# Patient Record
Sex: Female | Born: 1960 | Race: White | Hispanic: No | Smoking: Never smoker
Health system: Southern US, Community
[De-identification: ages and names within clinical notes are randomized; demographics above are authoritative.]

## PROBLEM LIST (undated history)

## (undated) DIAGNOSIS — K219 Gastro-esophageal reflux disease without esophagitis: Secondary | ICD-10-CM

## (undated) DIAGNOSIS — T4145XA Adverse effect of unspecified anesthetic, initial encounter: Secondary | ICD-10-CM

## (undated) DIAGNOSIS — R112 Nausea with vomiting, unspecified: Secondary | ICD-10-CM

## (undated) DIAGNOSIS — E785 Hyperlipidemia, unspecified: Secondary | ICD-10-CM

## (undated) DIAGNOSIS — Z8489 Family history of other specified conditions: Secondary | ICD-10-CM

## (undated) DIAGNOSIS — T7840XA Allergy, unspecified, initial encounter: Secondary | ICD-10-CM

## (undated) DIAGNOSIS — R740 Nonspecific elevation of levels of transaminase and lactic acid dehydrogenase [LDH]: Principal | ICD-10-CM

## (undated) DIAGNOSIS — Z8709 Personal history of other diseases of the respiratory system: Secondary | ICD-10-CM

## (undated) DIAGNOSIS — T8859XA Other complications of anesthesia, initial encounter: Secondary | ICD-10-CM

## (undated) DIAGNOSIS — Z9889 Other specified postprocedural states: Secondary | ICD-10-CM

## (undated) DIAGNOSIS — J302 Other seasonal allergic rhinitis: Secondary | ICD-10-CM

## (undated) HISTORY — PX: BREAST EXCISIONAL BIOPSY: SUR124

## (undated) HISTORY — PX: ABDOMINAL HYSTERECTOMY: SHX81

## (undated) HISTORY — PX: SPINE SURGERY: SHX786

## (undated) HISTORY — PX: BREAST BIOPSY: SHX20

## (undated) HISTORY — DX: Nonspecific elevation of levels of transaminase and lactic acid dehydrogenase (ldh): R74.0

## (undated) HISTORY — DX: Allergy, unspecified, initial encounter: T78.40XA

## (undated) HISTORY — DX: Hyperlipidemia, unspecified: E78.5

---

## 1998-07-31 ENCOUNTER — Encounter: Payer: Self-pay | Admitting: *Deleted

## 1998-07-31 ENCOUNTER — Ambulatory Visit (HOSPITAL_COMMUNITY): Admission: RE | Admit: 1998-07-31 | Discharge: 1998-07-31 | Payer: Self-pay | Admitting: *Deleted

## 1998-08-09 ENCOUNTER — Ambulatory Visit (HOSPITAL_COMMUNITY): Admission: RE | Admit: 1998-08-09 | Discharge: 1998-08-09 | Payer: Self-pay | Admitting: Otolaryngology

## 1999-02-16 ENCOUNTER — Other Ambulatory Visit: Admission: RE | Admit: 1999-02-16 | Discharge: 1999-02-16 | Payer: Self-pay | Admitting: Obstetrics and Gynecology

## 2000-03-03 ENCOUNTER — Other Ambulatory Visit: Admission: RE | Admit: 2000-03-03 | Discharge: 2000-03-03 | Payer: Self-pay | Admitting: Obstetrics and Gynecology

## 2000-03-13 ENCOUNTER — Ambulatory Visit (HOSPITAL_COMMUNITY): Admission: RE | Admit: 2000-03-13 | Discharge: 2000-03-13 | Payer: Self-pay | Admitting: Obstetrics and Gynecology

## 2000-03-13 ENCOUNTER — Encounter: Payer: Self-pay | Admitting: Obstetrics and Gynecology

## 2000-04-03 ENCOUNTER — Other Ambulatory Visit: Admission: RE | Admit: 2000-04-03 | Discharge: 2000-04-03 | Payer: Self-pay | Admitting: Obstetrics and Gynecology

## 2000-04-03 ENCOUNTER — Encounter (INDEPENDENT_AMBULATORY_CARE_PROVIDER_SITE_OTHER): Payer: Self-pay

## 2000-07-04 ENCOUNTER — Other Ambulatory Visit: Admission: RE | Admit: 2000-07-04 | Discharge: 2000-07-04 | Payer: Self-pay | Admitting: Obstetrics & Gynecology

## 2000-12-12 ENCOUNTER — Ambulatory Visit (HOSPITAL_COMMUNITY): Admission: RE | Admit: 2000-12-12 | Discharge: 2000-12-12 | Payer: Self-pay | Admitting: Obstetrics and Gynecology

## 2000-12-12 ENCOUNTER — Encounter (INDEPENDENT_AMBULATORY_CARE_PROVIDER_SITE_OTHER): Payer: Self-pay

## 2002-04-21 ENCOUNTER — Other Ambulatory Visit: Admission: RE | Admit: 2002-04-21 | Discharge: 2002-04-21 | Payer: Self-pay | Admitting: Obstetrics and Gynecology

## 2003-04-20 ENCOUNTER — Other Ambulatory Visit: Admission: RE | Admit: 2003-04-20 | Discharge: 2003-04-20 | Payer: Self-pay | Admitting: Obstetrics and Gynecology

## 2004-02-09 ENCOUNTER — Other Ambulatory Visit: Admission: RE | Admit: 2004-02-09 | Discharge: 2004-02-09 | Payer: Self-pay | Admitting: Obstetrics and Gynecology

## 2004-02-21 ENCOUNTER — Ambulatory Visit (HOSPITAL_COMMUNITY): Admission: RE | Admit: 2004-02-21 | Discharge: 2004-02-21 | Payer: Self-pay | Admitting: Obstetrics and Gynecology

## 2004-12-18 ENCOUNTER — Ambulatory Visit: Payer: Self-pay | Admitting: Internal Medicine

## 2005-02-12 ENCOUNTER — Other Ambulatory Visit: Admission: RE | Admit: 2005-02-12 | Discharge: 2005-02-12 | Payer: Self-pay | Admitting: Obstetrics and Gynecology

## 2005-02-26 ENCOUNTER — Ambulatory Visit (HOSPITAL_COMMUNITY): Admission: RE | Admit: 2005-02-26 | Discharge: 2005-02-26 | Payer: Self-pay | Admitting: Obstetrics and Gynecology

## 2005-04-15 ENCOUNTER — Ambulatory Visit: Payer: Self-pay | Admitting: Internal Medicine

## 2005-08-16 ENCOUNTER — Ambulatory Visit: Payer: Self-pay | Admitting: Internal Medicine

## 2005-08-29 ENCOUNTER — Ambulatory Visit: Payer: Self-pay | Admitting: Internal Medicine

## 2005-09-27 ENCOUNTER — Ambulatory Visit: Payer: Self-pay | Admitting: Internal Medicine

## 2005-10-21 HISTORY — PX: PARTIAL HYSTERECTOMY: SHX80

## 2005-12-04 ENCOUNTER — Ambulatory Visit: Payer: Self-pay | Admitting: Internal Medicine

## 2006-02-14 ENCOUNTER — Other Ambulatory Visit: Admission: RE | Admit: 2006-02-14 | Discharge: 2006-02-14 | Payer: Self-pay | Admitting: Obstetrics and Gynecology

## 2006-02-27 ENCOUNTER — Ambulatory Visit (HOSPITAL_COMMUNITY): Admission: RE | Admit: 2006-02-27 | Discharge: 2006-02-27 | Payer: Self-pay | Admitting: Obstetrics and Gynecology

## 2006-03-28 ENCOUNTER — Ambulatory Visit: Payer: Self-pay | Admitting: Internal Medicine

## 2006-06-20 ENCOUNTER — Ambulatory Visit (HOSPITAL_COMMUNITY): Admission: RE | Admit: 2006-06-20 | Discharge: 2006-06-20 | Payer: Self-pay | Admitting: Family Medicine

## 2006-06-20 ENCOUNTER — Emergency Department (HOSPITAL_COMMUNITY): Admission: EM | Admit: 2006-06-20 | Discharge: 2006-06-20 | Payer: Self-pay | Admitting: Emergency Medicine

## 2006-06-30 ENCOUNTER — Inpatient Hospital Stay (HOSPITAL_COMMUNITY): Admission: AD | Admit: 2006-06-30 | Discharge: 2006-07-03 | Payer: Self-pay | Admitting: Obstetrics and Gynecology

## 2006-06-30 ENCOUNTER — Encounter (INDEPENDENT_AMBULATORY_CARE_PROVIDER_SITE_OTHER): Payer: Self-pay | Admitting: Specialist

## 2006-07-01 ENCOUNTER — Encounter (INDEPENDENT_AMBULATORY_CARE_PROVIDER_SITE_OTHER): Payer: Self-pay | Admitting: Specialist

## 2007-03-25 ENCOUNTER — Ambulatory Visit (HOSPITAL_COMMUNITY): Admission: RE | Admit: 2007-03-25 | Discharge: 2007-03-25 | Payer: Self-pay | Admitting: Obstetrics and Gynecology

## 2007-04-27 ENCOUNTER — Ambulatory Visit: Payer: Self-pay | Admitting: Internal Medicine

## 2007-10-06 ENCOUNTER — Emergency Department (HOSPITAL_COMMUNITY): Admission: EM | Admit: 2007-10-06 | Discharge: 2007-10-06 | Payer: Self-pay | Admitting: Emergency Medicine

## 2007-10-22 HISTORY — PX: NECK SURGERY: SHX720

## 2007-11-09 ENCOUNTER — Telehealth: Payer: Self-pay | Admitting: Internal Medicine

## 2007-11-12 ENCOUNTER — Ambulatory Visit (HOSPITAL_COMMUNITY): Admission: RE | Admit: 2007-11-12 | Discharge: 2007-11-12 | Payer: Self-pay | Admitting: Orthopedic Surgery

## 2007-11-13 ENCOUNTER — Encounter: Payer: Self-pay | Admitting: Internal Medicine

## 2007-11-13 DIAGNOSIS — J309 Allergic rhinitis, unspecified: Secondary | ICD-10-CM | POA: Insufficient documentation

## 2007-11-21 ENCOUNTER — Encounter: Payer: Self-pay | Admitting: Internal Medicine

## 2008-01-20 ENCOUNTER — Encounter: Payer: Self-pay | Admitting: Internal Medicine

## 2008-03-07 ENCOUNTER — Ambulatory Visit (HOSPITAL_COMMUNITY): Admission: RE | Admit: 2008-03-07 | Discharge: 2008-03-08 | Payer: Self-pay | Admitting: Neurosurgery

## 2008-04-26 ENCOUNTER — Ambulatory Visit: Payer: Self-pay | Admitting: Internal Medicine

## 2008-08-10 ENCOUNTER — Encounter: Payer: Self-pay | Admitting: Internal Medicine

## 2008-08-23 ENCOUNTER — Encounter: Payer: Self-pay | Admitting: Internal Medicine

## 2008-08-27 ENCOUNTER — Emergency Department (HOSPITAL_COMMUNITY): Admission: EM | Admit: 2008-08-27 | Discharge: 2008-08-27 | Payer: Self-pay | Admitting: Emergency Medicine

## 2008-10-24 ENCOUNTER — Emergency Department (HOSPITAL_COMMUNITY): Admission: EM | Admit: 2008-10-24 | Discharge: 2008-10-24 | Payer: Self-pay | Admitting: Family Medicine

## 2009-03-15 ENCOUNTER — Ambulatory Visit (HOSPITAL_COMMUNITY): Admission: RE | Admit: 2009-03-15 | Discharge: 2009-03-15 | Payer: Self-pay | Admitting: Obstetrics and Gynecology

## 2009-03-25 ENCOUNTER — Emergency Department (HOSPITAL_COMMUNITY): Admission: EM | Admit: 2009-03-25 | Discharge: 2009-03-25 | Payer: Self-pay | Admitting: Emergency Medicine

## 2009-04-20 ENCOUNTER — Ambulatory Visit: Payer: Self-pay | Admitting: Internal Medicine

## 2009-07-27 ENCOUNTER — Emergency Department (HOSPITAL_COMMUNITY): Admission: EM | Admit: 2009-07-27 | Discharge: 2009-07-27 | Payer: Self-pay | Admitting: Emergency Medicine

## 2009-09-20 ENCOUNTER — Telehealth: Payer: Self-pay | Admitting: Internal Medicine

## 2009-11-15 ENCOUNTER — Emergency Department (HOSPITAL_COMMUNITY): Admission: EM | Admit: 2009-11-15 | Discharge: 2009-11-15 | Payer: Self-pay | Admitting: Family Medicine

## 2009-12-21 ENCOUNTER — Telehealth: Payer: Self-pay | Admitting: Internal Medicine

## 2010-03-16 ENCOUNTER — Ambulatory Visit (HOSPITAL_COMMUNITY): Admission: RE | Admit: 2010-03-16 | Discharge: 2010-03-16 | Payer: Self-pay | Admitting: Obstetrics and Gynecology

## 2010-04-13 ENCOUNTER — Emergency Department (HOSPITAL_COMMUNITY): Admission: EM | Admit: 2010-04-13 | Discharge: 2010-04-13 | Payer: Self-pay | Admitting: Family Medicine

## 2010-05-01 ENCOUNTER — Ambulatory Visit: Payer: Self-pay | Admitting: Internal Medicine

## 2010-05-14 ENCOUNTER — Encounter: Payer: Self-pay | Admitting: Internal Medicine

## 2010-06-04 ENCOUNTER — Ambulatory Visit (HOSPITAL_COMMUNITY): Admission: RE | Admit: 2010-06-04 | Discharge: 2010-06-04 | Payer: Self-pay | Admitting: Otolaryngology

## 2010-09-28 ENCOUNTER — Emergency Department (HOSPITAL_COMMUNITY)
Admission: EM | Admit: 2010-09-28 | Discharge: 2010-09-28 | Payer: Self-pay | Source: Home / Self Care | Admitting: Family Medicine

## 2010-11-11 ENCOUNTER — Encounter: Payer: Self-pay | Admitting: Family Medicine

## 2010-11-22 NOTE — Assessment & Plan Note (Signed)
Summary: 12 MONTHS/APC   Visit Type:  Follow-up PCP:  None  Chief Complaint:  yearly follow up visit-recently had neck surgery.  History of Present Illness: Current Problems:  ALLERGIC RHINITIS (ICD-74.68)   50 year old nonsmoker of returning for follow-up of allergic rhinitis.  Quit allergy vaccine in 2007.  Quit Singulair "hates pills.", using it only with flares.  Worse in the spring.  Wakes with headache, advanced refill Esgic-Plus.  Five Patanase nasal spray, and thought it made her tongue numb.  Denies material at discharge, fever, cough, or wheeze.       Updated Prior Medication List: RESCON-MX 8-40-2.5 MG  TB12 (CHLORPHEN-PHENYLEPH-METHSCOP) Take 1 tablet by mouth once a day as needed NASACORT AQ 55 MCG/ACT  AERS (TRIAMCINOLONE ACETONIDE(NASAL))  SINGULAIR 10 MG  TABS (MONTELUKAST SODIUM)   Current Allergies (reviewed today): ! DAYPRO  Past Surgical History:    Reviewed history and no changes required:       Partial hysterectomy       ? neck surgery?   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 NECK:  Supple w/ fair ROM; no JVD; normal carotid impulses w/o bruits; no thyromegaly or nodules palpated; no lymphadenopathy. CHEST: Clear to P&A HEART:  RRR, no m/r/g  heard ABDOMEN:  Soft & nt; nml bowel sounds; no organomegaly or masses detected.  EXT: Warm bilat,  no calf pain, edema, clubbing, pulses intact Skin: no rash/lesion        Impression & Recommendations:  Problem # 1:  ALLERGIC RHINITIS (ICD-477.9)  examination suggests incomplete control of rhinitis, but she is not inclined therapy.  Now.  We have discussed options.  I did suggest a Neti pot and we will give sample of astepro nasal spray.  Refill Esgic-Plus for headache. Her updated medication list for this problem includes:    Nasacort Aq 55 Mcg/act Aers (Triamcinolone acetonide(nasal))   Medications Added to Medication List This Visit: 1)  Butalbital-apap-caffeine 50-500-40 Mg Tabs (Butalbital-apap-caffeine) .Marland Kitchen.. 1 four times a day as needed   Patient Instructions: 1)  Please schedule a follow-up appointment in 1 year. 2)  Sample Astepro 1-2 sprays each nostril twice daily as needed 3)  Try the Neti pot approach to rinse your nasal passages 4)  Esgic script printed   Prescriptions: BUTALBITAL-APAP-CAFFEINE 50-500-40 MG  TABS (BUTALBITAL-APAP-CAFFEINE) 1 four times a day as needed  #50 x 3   Entered and Authorized by:   Waymon Budge MD   Signed by:   Waymon Budge MD on 04/26/2008   Method used:   Print then  Give to Patient   RxID:   [id number]  ]

## 2010-11-22 NOTE — Miscellaneous (Signed)
Summary: Add DALLERGY ER tabs  Clinical Lists Changes  Medications: Added new medication of DALLERGY PSE 4-60-1.25 MG XR12H-TAB (CHLORPHEN-PSEUDOEPH-METHSCOP) 1 by mouth daily as needed - Signed Rx of DALLERGY PSE 4-60-1.25 MG XR12H-TAB (CHLORPHEN-PSEUDOEPH-METHSCOP) 1 by mouth daily as needed;  #90 x 3;  Signed;  Entered by: Michel Bickers CMA;  Authorized by: Waymon Budge MD;  Method used: Telephoned to [facility]*, , ,   , Ph: [phone], Fax: [phone] RX form signed by Dr. Maple Hudson and faxed back to [facility]. Michel Bickers CMA  August 10, 2008 2:14 PM     Prescriptions: DALLERGY PSE 4-60-1.25 MG XR12H-TAB (CHLORPHEN-PSEUDOEPH-METHSCOP) 1 by mouth daily as needed  #90 x 3   Entered by:   Michel Bickers CMA   Authorized by:   Waymon Budge MD   Signed by:   Michel Bickers CMA on 08/10/2008   Method used:   Telephoned to ...       [facility]* (mail-order)             ,          Ph: [phone]       Fax: [phone]   RxID:   [id number]

## 2010-11-22 NOTE — Consult Note (Signed)
Summary: [facility]  [facility]   Imported By: Sherian Rein 05/24/2010 08:49:36  _____________________________________________________________________  External Attachment:    Type:   Image     Comment:   External Document

## 2010-11-22 NOTE — Miscellaneous (Signed)
Summary: remove RX  Clinical Lists Changes  Medications: Removed medication of RESCON-MX 8-40-2.5 MG  TB12 (CHLORPHEN-PHENYLEPH-METHSCOP) Take 1 tablet by mouth once a day as needed

## 2010-11-22 NOTE — Progress Notes (Signed)
Summary: refill-waiting on pharmacist to call back  Phone Note From Pharmacy Call back at [phone]   Caller:  outpatient pharmacy Call For: young  Reason for Call: Patient requests substitution Summary of Call: Pt has been on dallergy tablets and they are currently on back order with no release date from manufacturer. Patient is requesting an alternative. Please advise. Initial call taken by: Darletta Moll,  December 21, 2009 11:09 AM  Follow-up for Phone Call        Please advise, thanks Vernie Murders  December 21, 2009 11:13 AM  Per Dr Maple Hudson- there are 3 drugs that make up the Dallergy- chlorpheneramine 4 mg, psuedophedrine 60 mg and methscopalamine 1.25 mg. We need to call the pharmacist and see if pt can get all of these seperately and take all three.   Vernie Murders  December 21, 2009 1:22 PM  called and spoke with pharmacist and she states she is nt sure if al 3 meds are available and will check it out and call me back. Sh eis also going tolook at alternative meds that are similar to dallergy tab, she says there are a few out onmarket now. Carron Curie CMA  December 21, 2009 3:25 PM  pharmacist sent a fax regarding the available alternative to dallergy tabs. I have given to cy to review. Carron Curie CMA  December 21, 2009 4:42 PM   Additional Follow-up for Phone Call Additional follow up Details #1::        Pt stated that she only has one tab left, and she needs a rx asap, please advise.//[phone] Darletta Moll,  December 26, 2009 9:09 AM  will forward message back to CY to address alternatives to Dallergy.  Aundra Millet Reynolds LPN  December 26, 9145 9:42 AM     Additional Follow-up for Phone Call Additional follow up Details #2::    Spoke with CDY-suggest pt try Dallergy PE take 1 by mouth every 12 hours-will need to send Rx to [facility] out pt pharmacy. If this works well for pt then we can send 90 day supply if needed or give more refills if she gets monthly. ONLY send in 1 month  supply for now. Reynaldo Minium CMA  December 26, 2009 3:24 PM   pt advised and new rx sent to pharmacy. Carron Curie CMA  December 26, 2009 3:49 PM   New/Updated Medications: DALLERGY PE 8-20-2.5 MG XR12H-TAB (CHLORPHEN-PHENYLEPH-METHSCOP) two times a day Prescriptions: DALLERGY PE 8-20-2.5 MG XR12H-TAB (CHLORPHEN-PHENYLEPH-METHSCOP) two times a day  #60 x 0   Entered by:   Carron Curie CMA   Authorized by:   Waymon Budge MD   Signed by:   Carron Curie CMA on 12/26/2009   Method used:   Electronically to        [facility]* (retail)       [address]       [address] Shipping/mailing       [place], [place]  [address]       Ph: [phone]       Fax: [phone]   RxID:   [id number]

## 2010-11-22 NOTE — Assessment & Plan Note (Signed)
Summary: per pt call/cb   Primary Provider/Referring Provider:  None  CC:  Yearly followup.  Pt denies any complaints today.Marland Kitchen  History of Present Illness: 04/26/08-ALLERGIC RHINITIS (ICD-43.40)  50 year old nonsmoker of returning for follow-up of allergic rhinitis.  Quit allergy vaccine in 2007.  Quit Singulair "hates pills.", using it only with flares.  Worse in the spring.  Wakes with headache, advanced refill Esgic-Plus.  Five Patanase nasal spray, and thought it made her tongue numb.  Denies material at discharge, fever, cough, or wheeze.  04/20/09- Allergic rhinits This is best time- mid summer. Less stuffy nose and watery rhinorhea now. Stays on Nasacort and decongestant once daily. Adds Singulair in Spring season. She is satisfied with her current level of control, after years of more difficulty.    Current Medications (verified): 1)  Nasacort Aq 55 Mcg/act  Aers (Triamcinolone Acetonide(Nasal)) .... 2 Sprays To Each Nostril Daily 2)  Singulair 10 Mg  Tabs (Montelukast Sodium) .Marland Kitchen.. 1 Once Daily As Needed 3)  Butalbital-Apap-Caffeine 50-500-40 Mg  Tabs (Butalbital-Apap-Caffeine) .Marland Kitchen.. 1 Four Times A Day As Needed 4)  Dallergy Pse 4-60-1.25 Mg Xr12h-Tab (Chlorphen-Pseudoeph-Methscop) .Marland Kitchen.. 1 By Mouth Daily As Needed  Allergies (verified): 1)  ! Daypro  Past History:  Past Surgical History: Last updated: 04/26/2008 Partial hysterectomy ? neck surgery?  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 (04/26/2008)  Past Medical History: ALLERGIC RHINITIS (ICD-477.9) Migraine  Family History: Parents living- hypertension, dementia Father- AFIB, DM2,   Review of Systems      See HPI  Vital Signs:  Patient profile:   50 year old female Height:      64 inches Weight:      120.50 pounds BMI:     20.76 O2 Sat:       99 % on Room air Pulse rate:   82 / minute BP sitting:   166 / 78  (left arm)  Vitals Entered By: Vernie Murders (April 20, 2009 9:22 AM)  O2 Flow:  Room air  Physical Exam  Additional Exam:  General: A/Ox3; pleasant and cooperative, NAD, wdwn SKIN: no rash, lesions NODES: no lymphadenopathy HEENT: [place]/AT, EOM- WNL, Conjuctivae- clear, PERRLA, TM-WNL, Nose- clear, Throat- clear and wnl NECK: Supple w/ fair ROM, JVD- none, normal carotid impulses w/o bruits Thyroid-  CHEST: Clear to P&A HEART: RRR, no m/g/r heard ABDOMEN: Soft and nl;  AVW:UJWJ, nl pulses, no edema  NEURO: Grossly intact to observation      Impression & Recommendations:  Problem # 1:  ALLERGIC RHINITIS (ICD-477.9)  Doing very well  Her updated medication list for this problem includes:    Nasacort Aq 55 Mcg/act Aers (Triamcinolone acetonide(nasal)) .Marland Kitchen... 2 sprays to each nostril daily with no changes indicated after discussion.  Medications Added to Medication List This Visit: 1)  Nasacort Aq 55 Mcg/act Aers (Triamcinolone acetonide(nasal)) .... 2 sprays to each nostril daily 2)  Singulair 10 Mg Tabs (Montelukast sodium) .Marland Kitchen.. 1 once daily as needed  Other Orders: Est. Patient Level II (19147)  Patient Instructions: 1)  Please schedule a follow-up appointment in 1 year. 2)  Scripts for refils Prescriptions: BUTALBITAL-APAP-CAFFEINE 50-500-40 MG  TABS (BUTALBITAL-APAP-CAFFEINE) 1 four times a day as needed  #50 x 3   Entered and Authorized by:   Waymon Budge MD   Signed by:   Waymon Budge MD on 04/20/2009  Method used:   Print then Give to Patient   RxID:   [id number] NASACORT AQ 55 MCG/ACT  AERS (TRIAMCINOLONE ACETONIDE(NASAL)) 2 sprays to each nostril daily  #3 x 3   Entered and Authorized by:   Waymon Budge MD   Signed by:   Waymon Budge MD on 04/20/2009   Method used:   Print then Give to Patient   RxID:   [id number] DALLERGY PSE 4-60-1.25 MG XR12H-TAB  (CHLORPHEN-PSEUDOEPH-METHSCOP) 1 by mouth daily as needed  #90 x 3   Entered and Authorized by:   Waymon Budge MD   Signed by:   Waymon Budge MD on 04/20/2009   Method used:   Print then Give to Patient   RxID:   [id number]

## 2010-11-22 NOTE — Progress Notes (Signed)
  Phone Note From Pharmacy   Summary of Call: D-Allergy PSE no longer available. They can substitute D-Allergy PE (phenylephrine)    New/Updated Medications: * DALLERGY PE 4-60-1.25 MG XR12H-TAB (CHLORPHEN-PHEN-METHSCOP) 1 by mouth daily as needed Prescriptions: DALLERGY PE 4-60-1.25 MG XR12H-TAB (CHLORPHEN-PHEN-METHSCOP) 1 by mouth daily as needed  #90 x 3   Entered by:   Vernie Murders   Authorized by:   Waymon Budge MD   Signed by:   Vernie Murders on 09/20/2009   Method used:   Historical   RxID:   [id number] DALLERGY PE 4-60-1.25 MG XR12H-TAB (CHLORPHEN-PHEN-METHSCOP) 1 by mouth daily as needed  #90 x 3   Entered and Authorized by:   Waymon Budge MD   Signed by:   Waymon Budge MD on 09/20/2009   Method used:   Historical   RxID:   [id number]  x was faxed to [facility] outpt pharm Vernie Murders  September 20, 2009 5:17 PM

## 2010-11-22 NOTE — Assessment & Plan Note (Signed)
Summary: rov/jd   Primary Provider/Referring Provider:  None  CC:  Yearly follow up and pt has no complaints other than pt would like to see what ehr other options would be for her allergy medication.  History of Present Illness:  History of Present Illness: 04/26/08-ALLERGIC RHINITIS (ICD-30.9)  50 year old nonsmoker of returning for follow-up of allergic rhinitis.  Quit allergy vaccine in 2007.  Quit Singulair "hates pills.", using it only with flares.  Worse in the spring.  Wakes with headache, advanced refill Esgic-Plus.  Five Patanase nasal spray, and thought it made her tongue numb.  Denies material at discharge, fever, cough, or wheeze.  04/20/09- Allergic rhinits This is best time- mid summer. Less stuffy nose and watery rhinorhea now. Stays on Nasacort and decongestant once daily. Adds Singulair in Spring season. She is satisfied with her current level of control, after years of more difficulty.  May 01, 2010- Allergic rhinitis She has to order the D-allergy, and it isn't as effective as sudafed.. Singulair didn't help. Sudafed-PE doesn't work quite as well as Statistician. Using Nasacort. She had cautery years ago, but is willing now to consider a mechanical fix for a mechanical problem. Occasional stress headache. Denies purulent discharge.    Preventive Screening-Counseling & Management  Alcohol-Tobacco     Smoking Status: never  Current Medications (verified): 1)  Nasacort Aq 55 Mcg/act  Aers (Triamcinolone Acetonide(Nasal)) .... 2 Sprays To Each Nostril Daily 2)  Butalbital-Apap-Caffeine 50-500-40 Mg  Tabs (Butalbital-Apap-Caffeine) .Marland Kitchen.. 1 Four Times A Day As Needed 3)  Dallergy Pe 8-20-2.5 Mg Xr12h-Tab (Chlorphen-Phenyleph-Methscop) .... Two Times A Day 4)  Oscal 500/200 D-3 500-200 Mg-Unit Tabs (Calcium-Vitamin D) .Marland Kitchen.. 1 Tab Twice Daily  Allergies: 1)  ! Daypro  Past History:  Past Medical History: Last updated: 04/20/2009 ALLERGIC RHINITIS  (ICD-477.9) Migraine  Past Surgical History: Last updated: 04/26/2008 Partial hysterectomy ? neck surgery?  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  The patient denies shortness of breath with activity, shortness of breath at rest, productive cough, non-productive cough, coughing up blood, chest pain, irregular heartbeats, acid heartburn, indigestion, loss of appetite, weight change, abdominal pain, difficulty swallowing, sore throat, and tooth/dental problems.    Vital Signs:  Patient profile:   50 year old female Height:      64 inches Weight:      124.6 pounds BMI:     21.46 O2 Sat:      100 % on Room air Pulse rate:   109 / minute BP sitting:   110 / 76  (right arm) Cuff size:   regular  Vitals Entered By: Mindy [name] (May 01, 2010 8:53 AM)  O2 Flow:  Room air CC: Yearly follow up, pt has no complaints other than pt would like to see what ehr other options would be for her allergy medication Comments Meds and allergies updated Phone number updated Mindy [name]  May 01, 2010 8:55 AM    Physical Exam  Additional Exam:  General: A/Ox3; pleasant and cooperative, NAD, wdwn SKIN: no rash, lesions NODES: no lymphadenopathy HEENT: [name], EOM- WNL, Conjuctivae- clear, PERRLA, TM-WNL, Nose- narrow with frequent sniffing, Throat- clear and wnl,  Mallampati  II NECK: Supple w/  fair ROM, JVD- none, normal carotid impulses w/o bruits Thyroid-  CHEST: Clear to P&A HEART: RRR, no m/g/r heard ABDOMEN: Soft and nl;  ZOX:WRUE, nl pulses, no edema  NEURO: Grossly intact to observation      Impression & Recommendations:  Problem # 1:  ALLERGIC RHINITIS  (ICD-477.9)  She has a narrow nose. Rather than just depending forever on decongestants, she is willing to go back to Dr Annalee Genta to consider if more rom could be made in her nose mechanically. She continues D-allerg y for now. Her updated medication list for this problem includes:    Nasacort Aq 55 Mcg/act Aers (Triamcinolone acetonide(nasal)) .Marland Kitchen... 2 sprays to each nostril daily  Medications Added to Medication List This Visit: 1)  Oscal 500/200 D-3 500-200 Mg-unit Tabs (Calcium-vitamin d) .Marland Kitchen.. 1 tab twice daily  Other Orders: Est. Patient Level III (45409) ENT Referral (ENT)  Patient Instructions: 1)  Please schedule a follow-up appointment in 1 year. 2)  See [facility] for referral to dr Annalee Genta 3)  Samples Lodarane-D 24 one or two daily if needed. 4)  Script was sent for D-allergy Prescriptions: DALLERGY PE 8-20-2.5 MG XR12H-TAB (CHLORPHEN-PHENYLEPH-METHSCOP) two times a day  #180 x 3   Entered and Authorized by:   Mindy Budge MD   Signed by:   Mindy Budge MD on 05/01/2010   Method used:   Electronically to        [facility]* (retail)       [address]       [address] Shipping/mailing       [place], [place]  [address]       Ph: [phone]       Fax: [phone]   RxID:   [id number]

## 2010-12-31 LAB — POCT RAPID STREP A (OFFICE): Streptococcus, Group A Screen (Direct): NEGATIVE

## 2011-01-24 LAB — POCT URINALYSIS DIP (DEVICE)
Bilirubin Urine: NEGATIVE
Glucose, UA: NEGATIVE mg/dL
Hgb urine dipstick: NEGATIVE
Ketones, ur: NEGATIVE mg/dL
Nitrite: NEGATIVE
Protein, ur: NEGATIVE mg/dL
Specific Gravity, Urine: 1.02 (ref 1.005–1.030)
Urobilinogen, UA: 0.2 mg/dL (ref 0.0–1.0)
pH: 6 (ref 5.0–8.0)

## 2011-01-24 LAB — URINALYSIS, MICROSCOPIC ONLY
Bilirubin Urine: NEGATIVE
Glucose, UA: NEGATIVE mg/dL
Hgb urine dipstick: NEGATIVE
Ketones, ur: NEGATIVE mg/dL
Leukocytes, UA: NEGATIVE
Nitrite: NEGATIVE
Protein, ur: NEGATIVE mg/dL
Specific Gravity, Urine: 1.024 (ref 1.005–1.030)
Urobilinogen, UA: 0.2 mg/dL (ref 0.0–1.0)
pH: 6 (ref 5.0–8.0)

## 2011-01-24 LAB — URINE CULTURE: Colony Count: 2000

## 2011-02-16 ENCOUNTER — Inpatient Hospital Stay (INDEPENDENT_AMBULATORY_CARE_PROVIDER_SITE_OTHER)
Admission: RE | Admit: 2011-02-16 | Discharge: 2011-02-16 | Disposition: A | Discharge status: Home / Self Care | Payer: 59 | Source: Ambulatory Visit | Attending: Family Medicine | Admitting: Family Medicine

## 2011-02-16 DIAGNOSIS — N390 Urinary tract infection, site not specified: Secondary | ICD-10-CM

## 2011-02-16 LAB — POCT URINALYSIS DIP (DEVICE)
Bilirubin Urine: NEGATIVE
Glucose, UA: NEGATIVE mg/dL
Nitrite: NEGATIVE
Protein, ur: NEGATIVE mg/dL
Specific Gravity, Urine: 1.025 (ref 1.005–1.030)
Urobilinogen, UA: 0.2 mg/dL (ref 0.0–1.0)
pH: 6.5 (ref 5.0–8.0)

## 2011-02-19 ENCOUNTER — Telehealth: Payer: Self-pay | Admitting: Internal Medicine

## 2011-02-19 NOTE — Telephone Encounter (Signed)
Spoke w/ pt and she states her insurance will no longer cover Dallergy PE. Pt is needing an alternative. Pt states when she asked her insurance company what will they cover she was advised she needed to call her doctors office for an alternative. Please advise Dr. Maple Hudson. Thanks  Allergies  Allergen Reactions  . Oxaprozin     Carver Fila, CMA

## 2011-02-19 NOTE — Telephone Encounter (Signed)
Per CDY-suggest Mucinex D and Allegra 60mg  bid.Mindy [name]

## 2011-02-19 NOTE — Telephone Encounter (Signed)
Spoke w/ pt and advised her of cdy recs. Pt verbalized understanding and needed nothing else further

## 2011-03-05 NOTE — Assessment & Plan Note (Signed)
[facility]                             PULMONARY OFFICE NOTE   NAME:[name], Mindy A                       MRN:          [id number]  DATE:04/27/2007                            DOB:          01-06-1961    PROBLEM:  Allergic rhinitis, headache.   HISTORY:  She stopped allergy vaccine a year ago.  She says by  comparison this year, she has been sneezing more and may have had a  little increase in headache, but she is willing to keep on as she is  doing for now.  In the meantime she had GYN surgery.  She had previously  tried Astelin but disliked the taste.   MEDICATION:  1. Rescon - MX.  2. Nasacort AQ.  3. Singulair is used occasionally p.r.n.  4. Saline nasal spray.   DRUG INTOLERANT:  DAYPRO causing hives.   OBJECTIVE:  Weight 131 pounds.  BP 112/62, pulse 65, room air saturation  100%.  Nasal mucosa is rather reddened, not eroded.  There is periorbital edema  which is chronic without conjunctival injection.  No postnasal drip.  LUNGS:  Quiet and clear.  HEART:  Sounds regular.   IMPRESSION:  Allergic rhinitis.   PLAN:  Try Patanase 1 or 2 sprays each nostril b.i.d. p.r.n., while  continuing her Nasacort AQ.  Consider NasalCrom as an alternative.  Return p.r.n. or 1 year.     Clinton D. Maple Hudson, MD, Tonny Bollman, FACP  Electronically Signed    CDY/MedQ  DD: 04/27/2007  DT: 04/28/2007  Job #: [id number]

## 2011-03-05 NOTE — Op Note (Signed)
Mindy [name], GRAYS NO.:  [id number]   MEDICAL RECORD NO.:  [id number]          PATIENT TYPE:  OIB   LOCATION:  3523                         FACILITY:  [facility]   PHYSICIAN:  Hewitt Shorts, M.D.DATE OF BIRTH:  15-Jan-1961   DATE OF PROCEDURE:  03/07/2008  DATE OF DISCHARGE:                               OPERATIVE REPORT   PREOPERATIVE DIAGNOSES:  1. C6-C7 cervical disk herniation.  2. Cervical degenerative disk disease.  3. Cervical spondylosis.  4. Cervical radiculopathy.   POSTOPERATIVE DIAGNOSES:  1. C6-C7 cervical disk herniation.  2. Cervical degenerative disk disease.  3. Cervical spondylosis.  4. Cervical radiculopathy.   PROCEDURE:  C6-C7 anterior cervical decompression and arthrodesis with  allograft and tethered cervical plating.   SURGEON:  Hewitt Shorts, MD   ASSISTANTS:  1. Russell L. Webb Silversmith, RN  2. Hilda Lias, MD   ANESTHESIA:  General endotracheal.   INDICATION:  The patient is a 50 year old woman who presented with  cervical radiculopathy with weakness of the right triceps.  MRI revealed  single-level disk herniation, superimposed upon underlying degenerative  disk disease spondylosis, and a decision was made to proceed with single-  level anterior cervical diskectomy and arthrodesis.   PROCEDURE:  The patient was brought to the operating room and placed  under general endotracheal anesthesia.  The patient was placed in 10  pounds of Holter traction.  The neck was prepped with Betadine soap and  solution and draped in sterile fashion.  The line of the incision was  infiltrated with local anesthetic with epinephrine.  The incision was  made along the left side of the neck and carried down to the  subcutaneous tissue and platysma.  Bipolar cautery was used to maintain  hemostasis.  Dissection was carried down through an avascular plane  leaving the sternocleidomastoid, carotid artery, and jugular vein  laterally and the  trachea and esophagus medially.  The ventral aspect of  the vertebral column was identified and localizing x-rays were taken,  and the C6-C7 intervertebral disk space identified.  Diskectomy was  begun with incision of the annulus and continued with microcurettes and  pituitary rongeurs.  Anterior osteophytic overgrowth was removed using  an osteophyte removal tool and the X-Max drill.  The microscope was  draped and brought into the field to provide additional navigation,  illumination, and visualization.  The remainder of the decompression was  performed using microdissection and microsurgical technique.  The  cauterized endplates of the vertebral body surfaces were removed using  microcurettes and the X-Max drill, and posterior osteophytic overgrowth  was removed using the X-Max drill and a 2-mm Kerrison punch with a  central footplate.  We then carefully removed the posterior longitudinal  ligament and found the bilobed disk herniation worse on the left than  the right that was carefully removed and we were able to decompress the  exiting C7 nerve roots bilaterally.  The spinal canal and thecal sac  were similarly decompressed, and once decompression was completed, the  hemostasis was established with Gelfoam.  We measured the height of the  intervertebral  disk space, and a 6-mm implant was selected, it was  hydrated with saline solution, and positioned in the intervertebral disk  space and countersunk.  We then selected a 12-mm tethered cervical  plate, it was positioned over the fusion construct.  It was secured to  the vertebra with 4 x 14-mm variable-angled screws.  Each screw was  drilled.  The screws were placed in alternating fashion.  Once all 4  screws were in place, final tightening was performed.  The cervical  traction was discontinued, and x-ray was taken, which showed the graft,  plate, and screws all in good position, the alignment was good, and then  the wound was  irrigated with Bacitracin solution and checked for  hemostasis, which was established and confirmed, and then we proceeded  with closure.  The platysma was closed with interrupted inverted 2-0  undyed Vicryl sutures.  The subcutaneous and subcuticular layer closed  with interrupted inverted 3-0 undyed Vicryl sutures.  The skin was  reapproximated with Dermabond.  The procedure was tolerated well.  The  estimated blood loss was 25 mL.  Sponge and needle count correct.  Following the surgery, the patient was placed in a soft cervical collar,  reversed from the anesthetic, extubated, to be transferred to the  recovery room for further care.      Hewitt Shorts, M.D.  Electronically Signed     RWN/MEDQ  D:  03/07/2008  T:  03/08/2008  Job:  [id number]

## 2011-03-08 NOTE — Op Note (Signed)
NAMEMINNETTE, MERIDA NO.:  [id number]   MEDICAL RECORD NO.:  [id number]          PATIENT TYPE:  INP   LOCATION:  9310                          FACILITY:  [facility]   PHYSICIAN:  Miguel Aschoff, M.D.       DATE OF BIRTH:  1961/02/23   DATE OF PROCEDURE:  06/30/2006  DATE OF DISCHARGE:                                 OPERATIVE REPORT   PREOPERATIVE DIAGNOSIS:  Pelvic pain, pelvic mass.   POSTOPERATIVE DIAGNOSIS:  Pelvic pain, mass involving the right fallopian  tube, pelvic inflammation, possible tubal torsion versus tubal neoplasm.   SURGEON:  Dr. Miguel Aschoff.   ASSISTANT:  Gerrit Friends. Aldona Bar, M.D.   PROCEDURE:  Laparoscopy followed by laparotomy with right salpingectomy and  total abdominal hysterectomy.   SURGEON:  Dr. Miguel Aschoff.   ASSISTANT:  Gerrit Friends. Aldona Bar, M.D.   ANESTHESIA:  General.   BRIEF HISTORY:  The patient is a 50 year old white female who presented with  sudden onset of acute lower abdominal pain and right lower quadrant pain and  was evaluated at [facility] system at which time CT scan was  carried out which revealed a 6 cm posterior pelvic mass, thought to be a  uterine fibroid but other adnexal mass could not be excluded.  The patient's  pain continued.  In view of the degree of pain and this mass, it was felt  that this possibly represented necrobiosis of a fibroid or an acute process  involving pelvic mass.  The options were discussed with the patient.  She is  brought to the operating room to undergo laparoscopy and laparotomy if  indicated.  It was also discussed if a laparotomy needed to be done, that  hysterectomy also be done in conjunction with the laparotomy. Informed  consent has been obtained.   PROCEDURE:  The patient was taken to the operating placed in the supine  position.  General anesthesia was administered without difficulty.  She was  then placed in the dorsal lithotomy position, prepped and draped in usual  sterile  fashion.  Examination was carried out which revealed normal external  genitalia, normal Bartholin Skene's glands, normal urethra.  The cervix did  not reveal any lesions.  Evaluation of the uterus revealed a very large mass  in the cul-de-sac.  It was not certain whether this represented the  posterior uterine fundus with fibroids or some other adnexal pathology.  At  this point a Foley catheter inserted.  Hulka tenaculum was placed.  Attention was then directed to the umbilicus where a small infraumbilical  incision was made.  Veress needle was inserted and then the abdomen was  insufflated with 3 liters CO2.  Following insufflation laparoscope was  introduced and to allow complete visualization, a suprapubic 5 mm port was  established and under direct visualization and noted be moderate amount of  serosanguineous fluid in the cul-de-sac.  There was a large amount of  adhesions in the right lower quadrant extending to the anterior abdominal  wall.  These were filmy in nature. The uterus was visualized, appeared to  be  anterior, but it was not possible to elevate the uterus to evaluate the  mass.  Appeared to be densely held by adhesions.  Because of this it was  elected to discontinue laparoscopy and proceed with laparotomy.  At this  point Pfannenstiel incision was made, extended through subcutaneous tissue  with bleeding points being clamped and coagulated as they were encountered.  Fascia was then identified, incised transversely and separated from the  underlying rectus muscles.  Rectus muscles were divided in midline.  Peritoneum was then found and entered carefully avoiding underlying  structures.  At this point the peritoneal incision was extended under direct  visualization.  The anterior adhesions were then taken down sharply without  difficulty.  The self-retaining retractor was then placed through the wound  and viscera packed out of pelvis.  At this point it was possible to  free  this mass involving the cul-de-sac.  The mass proved to be a 6-cm mass  involving the right fallopian tube.  The right ovary appeared be within  normal limits.  It was not clear whether this represented a torsion of the  tube or whether this represented neoplasm within the tube.  The mass was  excised and sent for pathology.  As laparotomy was in process it was elected  proceed with the hysterectomy as previously discussed with the patient.  The  round ligaments were identified, clamped, cut, suture ligated.  Broad  ligament was then skeletonized and anterior bladder flap was created.  The  utero-ovarian ligaments were then clamped, cut and suture ligated using  suture ligatures of 0 Vicryl and then free ties of 0 Vicryl.  Broad  ligaments then further skeletonized.  Uterine vessels were found, clamped  and suture ligated using suture ligatures of 0 Vicryl.  Then serial bites  were taken of paracervical fascia using straight Heaney clamps, all pedicles  were cut and suture ligated using suture ligatures of 0 Vicryl was then  possible continued dissection until the cardinal ligaments, uterosacral  ligaments were identified.  These were clamped and suture ligated using  curved Heaney clamps and suture ligatures of 0 Vicryl.  At this point the  vagina was cross clamped and the specimen excised consisting of the cervix  and uterus.  The angles of the vaginal cuff were then ligated using figure  eight sutures of 0 Vicryl.  These were held and then the cuff was closed  using running locking 0 Vicryl suture.  The pelvic floor was then  reperitonealized using running continuous 0 Vicryl suture.  Inspection was  then made for hemostasis.  Hemostasis appeared be excellent. Again the left  tube and ovary were totally within normal limits.  The right ovary was  within normal limits.  There was a lot of fibrinous deposition in the cul-de- sac secondary to the mass that had been previously removed.   At this point  with good hemostasis, lap counts and instrument counts taken and found to be  correct and then the abdomen was closed.  The parietal peritoneum was closed  using running continuous 0 Vicryl suture.  Rectus muscles were  reapproximated using running continuous 0 Vicryl suture.  Fascia was closed  using two sutures of 0 Vicryl each starting at lateral fascial angles and  meeting in midline.  Subcutaneous tissue was closed using interrupted 0  Vicryl sutures and skin incision was closed using staples.  The small  infraumbilical incision was closed using subcuticular 4-0 Vicryl.  The  specimen of the right tube, cervix and the uterus were sent for pathologic  study.  The patient brought to recovery room in satisfactory condition.  The  estimated blood loss was approximately 100 mL.      Miguel Aschoff, M.D.  Electronically Signed     AR/MEDQ  D:  06/30/2006  T:  07/01/2006  Job:  [id number]

## 2011-03-08 NOTE — Discharge Summary (Signed)
[name], Mindy [name] NO.:  [id number]   MEDICAL RECORD NO.:  [id number]          PATIENT TYPE:  INP   LOCATION:  9310                          FACILITY:  [facility]   PHYSICIAN:  Miguel Aschoff, M.D.       DATE OF BIRTH:  1960/11/04   DATE OF ADMISSION:  06/30/2006  DATE OF DISCHARGE:  07/03/2006                                 DISCHARGE SUMMARY   ADMISSION DIAGNOSIS:  Pelvic mass, pelvic pain.   FINAL DIAGNOSIS:  Necrosis of parasitic leiomyoma.   OPERATIONS AND PROCEDURES:  Laparoscopy followed by right salpingectomy and  total abdominal hysterectomy.  General anesthesia.   BRIEF HISTORY:  The patient is a 50 year old white female who presented with  severe abdominal pain.  She was seen in the emergency room and noted to have  a solid 6-cm mass in the pelvis.  This was initially treated conservatively.  However, the pain progressed, and because of the symptomatology associated  with it, the patient was brought to the hospital to undergo laparoscopy and  laparotomy if indicated.  Preoperative studies were obtained which revealed  an admission hemoglobin of 9.9, hematocrit 29.5, white count of 6100.  PT/PTT were within normal limits, as were her chemistries.  Pregnancy test  was negative.   HOSPITAL COURSE:  Under general anesthesia, the patient was brought to the  operating room and a laparoscopy was carried out, initially revealing an  inflammatory mass involving the pelvis.  This mass was adherent to the  posterior surface of the uterus, as well as to the bowel.  Because it could  not be mobilized laparoscopically, it was elected to proceed with a  laparotomy.  This inflammatory mass was then freed and appeared to be a  large solid mass arising off the right fallopian tube.  The mass was excised  along with the right tube.  The right ovary was within normal limits, as was  the left ovary, and following excision of this mass hysterectomy was carried  out without  difficulty.  The patient's postoperative course was essentially  uncomplicated.  She tolerated increasing ambulation and diet well.  She did  have some mild nausea, and by July 03, 2006, she was in satisfactory  condition and able to be discharged home.   MEDICATIONS FOR HOME:  1. Tylox one every 3 hours as needed for pain.  2. Reglan 10 mg every 6 hours as needed for nausea.  3. Toradol 10 mg p.o. q.4 h p.r.n. pain.   DISCHARGE INSTRUCTIONS:  She was instructed to place nothing in the vagina,  to call if there are problems such as fever, pain or heavy bleeding.   FOLLOWUP:  The patient will be seen back in 4 weeks for follow-up  examination.   PATHOLOGY REPORT:  The final pathology report of the specimen revealed a  necrotic leiomyoma.  Cervix and uterus were unremarkable.      Miguel Aschoff, M.D.  Electronically Signed     AR/MEDQ  D:  07/23/2006  T:  07/24/2006  Job:  [id number]

## 2011-03-08 NOTE — H&P (Signed)
[facility]  Patient:    Mindy [name], Mindy [name]                         MRN: [id number] Attending:  Janine Limbo, M.D.                         History and Physical  HISTORY OF PRESENT ILLNESS:   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  She had had cryotherapy twice in the past because of abnormal Pap smears.  The patient underwent colposcopically-directed biopsy on October 31, 2000, and she as noted to have a stenotic cervix at that point.  The transition zone could not be visualized. The patient was found to have white epithelium on her exocervix.  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  The patient is currently using Triphasil for contraception.  PAST MEDICAL HISTORY:         The patient has seasonal allergies, and she currently takes Claritin D and Flonase as needed.  DRUG ALLERGIES:               DAYPRO.  OBSTETRICAL HISTORY:          None.  SOCIAL HISTORY:               The patient drinks alcohol socially.  She denies cigarette use and other recreational drug uses.  REVIEW OF SYSTEMS:            Noncontributory.  FAMILY HISTORY:               The patients father has adult-onset diabetes. Her father, mother, and two brothers have hypertension.  PHYSICAL EXAMINATION:  VITAL SIGNS:                  Weight is 119 pounds.  HEENT:                        Within normal limits.  CHEST:                        Clear.  HEART:                        Regular rate and rhythm.  BREASTS:                      Without masses.  ABDOMEN:                      Nontender.  EXTREMITIES:                  Within normal limits.  NEUROLOGIC:                   Normal.  PELVIC:                       External genitalia is normal.  The vagina is normal.  Cervix is nontender.  Uterus is normal size, shape, and consistency. Adnexa no masses.  ASSESSMENT:                   Abnormal Pap smear  with an inadequate colposcopy.  History of CIN in the past.  PLAN:  The patient will undergo a loop electrosurgical excision procedure.  She understands the indications for her procedure, and she accepts the risks of, but not limited to, anesthetic complications, bleeding, infections, and possible damage to the surrounding organs.   DD: 12/11/00 TD:  12/11/00 Job: [id number]

## 2011-03-08 NOTE — Discharge Summary (Signed)
Mindy [name], CILIA NO.:  [id number]   MEDICAL RECORD NO.:  [id number]          PATIENT TYPE:  INP   LOCATION:  9310                          FACILITY:  [facility]   PHYSICIAN:  Miguel Aschoff, M.D.       DATE OF BIRTH:  Aug 29, 1961   DATE OF ADMISSION:  06/30/2006  DATE OF DISCHARGE:  07/03/2006                                 DISCHARGE SUMMARY   ADMISSION DATE:  June 30, 2006.   DISCHARGED:  July 03, 2006.   ADMISSION DIAGNOSES:  Pelvic mass, pelvic pain.   FINAL DIAGNOSES:  Necrotic leiomyoma.   OPERATIONS AND PROCEDURES:  Laparoscopy followed by exploratory laparotomy  with total abdominal hysterectomy and right salpingectomy.   BRIEF HISTORY:  The patient is a 50 year old, white female who developed the  onset of severe lower abdominal pain on August 29, for which she was sent to  the emergency room in the [facility] system.  At that time, CT scan  was performed and revealed the presence of an 8-cm mass in the cul-de-sac  that appeared to be consistent with a myoma.  No other abnormalities were  noted.  In the pelvis, there was no free fluid.  The patient was initially  treated conservatively; however, pain persisted and worsened and she was  evaluated on June 26, 2006, at [facility] and she has marked  pelvic tenderness and pain and, again, ultrasound findings consistent with a  fibroid.  It was felt that the fibroid was undergoing necrosis and this was  accounting for her symptomatology.  Due to the severe symptoms, however, it  was necessary to admit the patient to the hospital to undergo laparoscopy to  see if this mass could be identified and removed.  She was therefore  admitted to the [facility] for a diagnostic laparoscopic and possible  laparotomy.   HOSPITAL COURSE:  Preoperative studies were obtained and, on June 30, 2006, she was brought to the operating room where under general anesthesia,  laparoscopy  was carried out.  The findings of laparoscopy revealed adhesions  of the omentum to the anterior abdominal wall in the right lower quadrant as  well as a large inflammatory mass in the cul-de-sac that could not be  assessed adequately through the laparoscope.  Due to this, laparotomy was  carried out.  Findings noted at the laparotomy revealed a hard necrotic mass  filling the cul-de-sac adherent between the right ovary, posterior broad  ligament, and posterior surface of the uterus as well as the posterior  surface of the cul-de-sac.  The mass was elevated and appeared to be  originating from the fallopian tube.  The mass was excised and, following  the excision of the mass, hysterectomy was carried out without difficulty.  The patient's postoperative course was essentially uncomplicated.  She did  tolerate increasing ambulation and diet well and, by September 13, was in  satisfactory condition to be discharged home.   MEDICATIONS FOR HOME:  Included  1. Tylox 1 every 3 hours as needed for pain.  2. Toradol 10 mg  p.o. q.4h. p.r.n.  3. Reglan 10 mg p.r.n. nausea.   The pathology report on the mass revealed be to be 121 g necrotic leiomyoma.  Hysterectomy specimen revealed an endometrium, unremarkable cervix, and,  otherwise, unremarkable myometrium.   The patient was sent home.  She will be seen back in 4 weeks for follow-up  examination.  She is to call if there are problems such as fever, pain, or  heavy bleeding.  She is instructed to place nothing in the vagina and to  avoid any heavy lifting for 4 weeks.   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      Miguel Aschoff, M.D.  Electronically Signed     AR/MEDQ  D:  07/09/2006  T:  07/10/2006  Job:  [id number]

## 2011-03-08 NOTE — Op Note (Signed)
[facility]  Patient:    Mindy [name], Mindy [name]                       MRN: [id number] Proc. Date: 12/13/00 Adm. Date:  [id number] Disc. Date: [id number] Attending:  Leonard Schwartz                           Operative Report  PREOPERATIVE DIAGNOSIS:       1. 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                               2. Inadequate colposcopy (transition zone not                                  seen).   POSTOPERATIVE DIAGNOSIS:      1. 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                               2. Inadequate colposcopy (transition zone not                                  seen).  OPERATION:                    LEEP (loop electrosurgical excision).  SURGEON:                      Janine Limbo, M.D.  ANESTHESIA:                   Local Marcaine.  ESTIMATED BLOOD LOSS:         Less than 5 cc.  INDICATIONS:                  The patient is a 50 year old who has had cryotherapy twice in the past because of cervical intraepithelial neoplasia. She again has cervical intraepithelial neoplasia and on this occasion her transition zone cannot be visualized. 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 The patient understands the indications for this procedure and she accepts the risks of, but not limited to anesthetic complications, bleeding, infections, and possible damage to the surrounding organs.  INTRAOPERATIVE FINDINGS:      The cervix was easily visualized and there were no unstained areas using Lugol solution.  DESCRIPTION OF PROCEDURE:     The patient was taken to the operating room where she was placed in a lithotomy position. The vagina and cervix were prepped with multiple layers of acetic acid. Lugols solution was placed on the cervix. The cervix was injected with a dilute solution of  Pitressin, saline and 0.50% Marcaine. The LEEP apparatus was used to obtain a cone-like specimen. Hemostasis was noted to be  adequate. The patient was returned to the supine position and then taken to the recovery room in stable condition. The estimated blood loss was less than 5 cc. DD:  12/13/00 TD:  12/15/00 Job: [id number]

## 2011-04-30 ENCOUNTER — Ambulatory Visit: Payer: Self-pay | Admitting: Internal Medicine

## 2011-05-31 ENCOUNTER — Encounter: Payer: Self-pay | Admitting: Internal Medicine

## 2011-06-03 ENCOUNTER — Ambulatory Visit (INDEPENDENT_AMBULATORY_CARE_PROVIDER_SITE_OTHER): Payer: 59 | Admitting: Internal Medicine

## 2011-06-03 ENCOUNTER — Encounter: Payer: Self-pay | Admitting: Internal Medicine

## 2011-06-03 VITALS — BP 110/70 | HR 73 | Ht 64.0 in | Wt 127.4 lb

## 2011-06-03 DIAGNOSIS — J309 Allergic rhinitis, unspecified: Secondary | ICD-10-CM

## 2011-06-03 MED ORDER — SCOPOLAMINE 1 MG/3DAYS TD PT72: 1.0000 | MEDICATED_PATCH | TRANSDERMAL | Status: DC

## 2011-06-03 MED ORDER — IPRATROPIUM BROMIDE 0.06 % NA SOLN: 2.0000 | Freq: Four times a day (QID) | NASAL | Status: DC

## 2011-06-03 NOTE — Patient Instructions (Signed)
Scripts to try ipratropium anticholinergic nasal spray as needed, and scopolomine transdermal patches as needed. Either can be combined with your other meds.  Consider revisiting Dr Annalee Genta

## 2011-06-03 NOTE — Progress Notes (Signed)
Subjective:    Patient ID: Mindy [name], female    DOB: January 06, 1961, 50 y.o.   MRN: [id number]  HPI 06/03/11- 64 yoF never smoker,  followed for allergic rhinitis complicated by hx sinusitis Last here May 01, 2010- Nose not working as well as she would like. DAllergy available by prescription, but costs too much. She is now using either Mucinex D or Allegra D and having more headaches, pressure behind eyes and frontal, sniffing. Dislikes Neti pot but hasn't tried squeeze bottle. Expects to be worse in fall allergy season. When she was on allergy shots, she was using the same amounts of meds- not better.  Previous cauterization and evaluation by Dr Annalee Genta helped some.   Review of Systems Constitutional:   No-   weight loss, night sweats, fevers, chills, fatigue, lassitude. HEENT:   No-  headaches, difficulty swallowing, tooth/dental problems, sore throat,       + sneezing, itching, ear ache, nasal congestion, post nasal drip,  CV:  No-   chest pain, orthopnea, PND, swelling in lower extremities, anasarca, dizziness, palpitations Resp: No-   shortness of breath with exertion or at rest.              No-   productive cough,  No non-productive cough,  No-  coughing up of blood.              No-   change in color of mucus.  No- wheezing.   Skin: No-   rash or lesions. GI:  No-   heartburn, indigestion, abdominal pain, nausea, vomiting, diarrhea,                 change in bowel habits, loss of appetite GU: No-   dysuria, change in color of urine, no urgency or frequency.  No- flank pain. MS:  No-   joint pain or swelling.  No- decreased range of motion.  No- back pain. Neuro- grossly normal to observation, Or:  Psych:  No- change in mood or affect. No depression or anxiety.  No memory loss.      Objective:   Physical Exam General- Alert, Oriented, Affect-appropriate, Distress- none acute Skin- rash-none, lesions- none, excoriation- none Lymphadenopathy- none Head- atraumatic   Eyes- Gross vision intact, PERRLA, conjunctivae clear secretions            Ears- Hearing, canals            Nose- Clear, +Septal dev, mucus, polyps, erosion, perforation Constant sniffing            Throat- Mallampati II , mucosa clear , drainage- none, tonsils- atrophic Neck- flexible , trachea midline, no stridor , thyroid nl, carotid no bruit Chest - symmetrical excursion , unlabored           Heart/CV- RRR , no murmur , no gallop  , no rub, nl s1 s2                           - JVD- none , edema- none, stasis changes- none, varices- none           Lung- clear to P&A, wheeze- none, cough- none , dullness-none, rub- none           Chest wall-  Abd- tender-no, distended-no, bowel sounds-present, HSM- no Br/ Gen/ Rectal- Not done, not indicated Extrem- cyanosis- none, clubbing, none, atrophy- none, strength- nl Neuro- grossly intact to observation        Assessment &  Plan:

## 2011-06-03 NOTE — Assessment & Plan Note (Signed)
We have discussed what can be done with medications beyond the antihistamine and decongestant combinations she is currently using. Previous medications that helped included scopolamine so we reviewed available products. We also discussed side effects. I assume that some of her headache problem comes from her uncomfortable sinus pressure and some comes from tension headache. We also discussed a trial of anticholinergic nasal spray and use of saline rinse by squeeze bottle. Dr. Annalee Genta had done a cauterization of her nasal mucosa in the past, but apparently she was not a candidate or not willing to accept turbinate reduction. Nasal strips did not help much.

## 2011-07-07 ENCOUNTER — Inpatient Hospital Stay (INDEPENDENT_AMBULATORY_CARE_PROVIDER_SITE_OTHER)
Admission: RE | Admit: 2011-07-07 | Discharge: 2011-07-07 | Disposition: A | Discharge status: Home / Self Care | Payer: 59 | Source: Ambulatory Visit | Attending: Emergency Medicine | Admitting: Emergency Medicine

## 2011-07-07 DIAGNOSIS — J019 Acute sinusitis, unspecified: Secondary | ICD-10-CM

## 2011-07-23 LAB — POCT URINALYSIS DIP (DEVICE)
Bilirubin Urine: NEGATIVE
Glucose, UA: NEGATIVE mg/dL
Hgb urine dipstick: NEGATIVE
Ketones, ur: NEGATIVE mg/dL
Nitrite: NEGATIVE
Operator id: 239701
Protein, ur: NEGATIVE mg/dL
Specific Gravity, Urine: <=1.005 (ref 1.005–1.030)
Urobilinogen, UA: 0.2 mg/dL (ref 0.0–1.0)
pH: 5.5 (ref 5.0–8.0)

## 2011-09-24 ENCOUNTER — Other Ambulatory Visit: Payer: Self-pay | Admitting: Obstetrics and Gynecology

## 2011-09-30 ENCOUNTER — Other Ambulatory Visit: Payer: Self-pay | Admitting: Internal Medicine

## 2011-12-02 ENCOUNTER — Encounter: Payer: Self-pay | Admitting: Internal Medicine

## 2011-12-02 ENCOUNTER — Ambulatory Visit (INDEPENDENT_AMBULATORY_CARE_PROVIDER_SITE_OTHER): Payer: 59 | Admitting: Internal Medicine

## 2011-12-02 VITALS — BP 108/62 | HR 75 | Ht 64.0 in | Wt 119.8 lb

## 2011-12-02 DIAGNOSIS — J309 Allergic rhinitis, unspecified: Secondary | ICD-10-CM

## 2011-12-02 DIAGNOSIS — J0100 Acute maxillary sinusitis, unspecified: Secondary | ICD-10-CM

## 2011-12-02 MED ORDER — METHYLPREDNISOLONE ACETATE 80 MG/ML IJ SUSP
80.0000 mg | Freq: Once | INTRAMUSCULAR | Status: AC
  Administered 2011-12-02: 80 mg via INTRAMUSCULAR

## 2011-12-02 MED ORDER — FEXOFENADINE-PSEUDOEPHED ER 180-240 MG PO TB24: 1.0000 | ORAL_TABLET | Freq: Every day | ORAL | Status: DC

## 2011-12-02 MED ORDER — PSEUDOEPHEDRINE-GUAIFENESIN ER 60-600 MG PO TB12: 1.0000 | ORAL_TABLET | Freq: Two times a day (BID) | ORAL | Status: DC

## 2011-12-02 MED ORDER — CLARITHROMYCIN 500 MG PO TABS: ORAL_TABLET | ORAL | Status: AC

## 2011-12-02 MED ORDER — PHENYLEPHRINE HCL 1 % NA SOLN
3.0000 [drp] | Freq: Once | NASAL | Status: AC
  Administered 2011-12-02: 3 [drp] via NASAL

## 2011-12-02 NOTE — Progress Notes (Signed)
Patient ID: Mindy [name], female    DOB: 04/20/1961, 51 y.o.   MRN: [id number]  HPI 06/03/11- 53 yoF never smoker,  followed for allergic rhinitis complicated by hx sinusitis Last here May 01, 2010- Nose not working as well as she would like. DAllergy available by prescription, but costs too much. She is now using either Mucinex D or Allegra D and having more headaches, pressure behind eyes and frontal, sniffing. Dislikes Neti pot but hasn't tried squeeze bottle. Expects to be worse in fall allergy season. When she was on allergy shots, she was using the same amounts of meds- not better.  Previous cauterization and evaluation by Dr Annalee Genta helped some.   12/02/11- 50 yoF never smoker,  followed for allergic rhinitis complicated by hx sinusitis She is doing fairly well until she caught a cold 3 days ago. Scratchy throat frontal headache mild chest tightness. Has not had a bad flu syndrome this winter. Denies fever or, discolored sputum or significant sore throat. Chest remains clear with no wheeze or cough.  Review of Systems Constitutional:   No-   weight loss, night sweats, fevers, chills, fatigue, lassitude. HEENT:   +  headaches,  No-difficulty swallowing, tooth/dental problems, sore throat,       + sneezing, itching, ear ache, nasal congestion, post nasal drip,  CV:  No-   chest pain, orthopnea, PND, swelling in lower extremities, anasarca, dizziness, palpitations Resp: No-   shortness of breath with exertion or at rest.              No-   productive cough,  No non-productive cough,  No-  coughing up of blood.              No-   change in color of mucus.  No- wheezing.   Skin: No-   rash or lesions. GI:  No-   heartburn, indigestion, abdominal pain, nausea, vomiting, diarrhea,                 change in bowel habits, loss of appetite GU:  MS:  No-   joint pain or swelling.  No- decreased range of motion.  No- back pain. Neuro- grossly normal to observation, Or:  Psych:  No- change  in mood or affect. No depression or anxiety.  No memory loss.      Objective:   Physical Exam General- Alert, Oriented, Affect-appropriate, Distress- none acute Skin- rash-none, lesions- none, excoriation- none Lymphadenopathy- none Head- atraumatic            Eyes- Gross vision intact, PERRLA, conjunctivae clear secretions            Ears- Hearing, canals            Nose- , +Septal dev, +mucus,  No-polyps, erosion, perforation. +Constant sniffing, +periorbital edema            Throat- Mallampati II , mucosa clear , drainage- none, tonsils- atrophic Neck- flexible , trachea midline, no stridor , thyroid nl, carotid no bruit Chest - symmetrical excursion , unlabored           Heart/CV- RRR , no murmur , no gallop  , no rub, nl s1 s2                           - JVD- none , edema- none, stasis changes- none, varices- none           Lung- clear to P&A,  wheeze- none, cough- mild , dullness-none, rub- none           Chest wall-  Abd-  Br/ Gen/ Rectal- Not done, not indicated Extrem- cyanosis- none, clubbing, none, atrophy- none, strength- nl Neuro- grossly intact to observation

## 2011-12-02 NOTE — Patient Instructions (Signed)
Scripts printed for your decongestant meds  Scrip sent for biaxin  Neb neo nasal  Depo 80

## 2011-12-05 ENCOUNTER — Ambulatory Visit: Payer: 59 | Admitting: Internal Medicine

## 2011-12-05 NOTE — Assessment & Plan Note (Signed)
Plan-Mucinex D, nebulized decongestant, Depo-Medrol, Biaxin, continue saline lavage.

## 2011-12-05 NOTE — Assessment & Plan Note (Signed)
She did not respond to allergy vaccine which has been discontinued. Symptomatic control is usually sufficient.

## 2011-12-27 ENCOUNTER — Other Ambulatory Visit (HOSPITAL_COMMUNITY): Payer: Self-pay | Admitting: Obstetrics and Gynecology

## 2011-12-27 DIAGNOSIS — Z1231 Encounter for screening mammogram for malignant neoplasm of breast: Secondary | ICD-10-CM

## 2012-01-22 ENCOUNTER — Ambulatory Visit (HOSPITAL_COMMUNITY)
Admission: RE | Admit: 2012-01-22 | Discharge: 2012-01-22 | Disposition: A | Discharge status: Home / Self Care | Payer: 59 | Source: Ambulatory Visit | Attending: Obstetrics and Gynecology | Admitting: Obstetrics and Gynecology

## 2012-01-22 DIAGNOSIS — Z1231 Encounter for screening mammogram for malignant neoplasm of breast: Secondary | ICD-10-CM | POA: Insufficient documentation

## 2012-02-11 ENCOUNTER — Telehealth: Payer: Self-pay | Admitting: Internal Medicine

## 2012-02-11 MED ORDER — ACRIVASTINE-PSEUDOEPHEDRINE 8-60 MG PO CAPS: 1.0000 | ORAL_CAPSULE | Freq: Two times a day (BID) | ORAL | Status: DC

## 2012-02-11 MED ORDER — AMOXICILLIN-POT CLAVULANATE 875-125 MG PO TABS: 1.0000 | ORAL_TABLET | Freq: Two times a day (BID) | ORAL | Status: AC

## 2012-02-11 NOTE — Telephone Encounter (Signed)
Per [facility]-had we previously had her try Semprex D? Also, okay to send Augmentin 875 mg #20 take 1 po bid no refills.

## 2012-02-11 NOTE — Telephone Encounter (Signed)
Pt aware that Rx has been sent to pharmacy.

## 2012-02-11 NOTE — Telephone Encounter (Signed)
Pt stated she does not remember her trying the semprex D. Rx for augmentin has been sent to the pharmacy and pt aware of directions. PLease advise Dr. Maple Hudson, thanks

## 2012-02-11 NOTE — Telephone Encounter (Addendum)
I spoke with the pt and she is c/o sinus pain and pressure that she feels all over her facial area. She states she is even having pain in her teeth from the pressure as well as nasal congestion. She states this has all been x 3 weeks and has been getting worse over last several days. Pt denies any chest congestion or cough.  Please advise.Carron Curie, CMA Allergies  Allergen Reactions  . Oxaprozin    Pharmacy: [facility]

## 2012-02-11 NOTE — Telephone Encounter (Signed)
Offer Semprex-D # 20, 1 twice daily as needed for congestion.

## 2012-04-20 ENCOUNTER — Telehealth: Payer: Self-pay | Admitting: Internal Medicine

## 2012-04-20 NOTE — Telephone Encounter (Signed)
Order- change Rx to butalbital/acetaminophen/caffeine  50/ 325/40 # 75, 1 every 6 hours if needed, ref x 3

## 2012-04-20 NOTE — Telephone Encounter (Signed)
Received fax from [facility] > butabital-APAP-caffeine 50-500-40mg , this strength has been discontinued.  Pharmacy is requesting a replacement.  Dr Maple Hudson please advise, thanks.  Last ov with [facility] 12-04-12, next 06-01-12.

## 2012-04-21 MED ORDER — BUTALBITAL-APAP-CAFFEINE 50-325-40 MG PO TABS: ORAL_TABLET | ORAL | Status: DC

## 2012-04-21 NOTE — Telephone Encounter (Signed)
RX has been sent and pt aware of the change

## 2012-06-01 ENCOUNTER — Encounter: Payer: Self-pay | Admitting: Internal Medicine

## 2012-06-01 ENCOUNTER — Ambulatory Visit (INDEPENDENT_AMBULATORY_CARE_PROVIDER_SITE_OTHER): Payer: 59 | Admitting: Internal Medicine

## 2012-06-01 VITALS — BP 122/78 | HR 89 | Ht 64.0 in | Wt 109.4 lb

## 2012-06-01 DIAGNOSIS — J300 Vasomotor rhinitis: Secondary | ICD-10-CM | POA: Insufficient documentation

## 2012-06-01 DIAGNOSIS — J309 Allergic rhinitis, unspecified: Secondary | ICD-10-CM

## 2012-06-01 NOTE — Progress Notes (Signed)
Patient ID: Mindy [name], female    DOB: 1960/12/10, 51 y.o.   MRN: [id number]  HPI 06/03/11- 35 yoF never smoker,  followed for allergic rhinitis complicated by hx sinusitis Last here May 01, 2010- Nose not working as well as she would like. DAllergy available by prescription, but costs too much. She is now using either Mucinex D or Allegra D and having more headaches, pressure behind eyes and frontal, sniffing. Dislikes Neti pot but hasn't tried squeeze bottle. Expects to be worse in fall allergy season. When she was on allergy shots, she was using the same amounts of meds- not better.  Previous cauterization and evaluation by Dr Annalee Genta helped some.   12/02/11- 50 yoF never smoker,  followed for allergic rhinitis complicated by hx sinusitis She is doing fairly well until she caught a cold 3 days ago. Scratchy throat frontal headache mild chest tightness. Has not had a bad flu syndrome this winter. Denies fever or, discolored sputum or significant sore throat. Chest remains clear with no wheeze or cough.  06/01/12- 50 yoF never smoker,  followed for allergic rhinitis complicated by hx sinusitis  Last week was rough-itchy watery eyes, congestion-nasal. Weather was humid and cloudy. . Recent days much better.  Usually sufficient Allegra-D and occ Sudafed with saline nasal spray. Only a couple of sinus infections in past year.  Does sometimes use Atrovent nasal spray and nasacort. Discussed available alternatives. Failed allergy vaccine. We discussed allergic vs non-allergic/ vasomotor rhinitis.  Review of Systems- see HPI Constitutional:   No-   weight loss, night sweats, fevers, chills, fatigue, lassitude. HEENT:   +  headaches,  No-difficulty swallowing, tooth/dental problems, sore throat,       + sneezing, itching, ear ache, nasal congestion, post nasal drip,  CV:  No-   chest pain, orthopnea, PND, swelling in lower extremities, anasarca, dizziness, palpitations Resp: No-   shortness of  breath with exertion or at rest.              No-   productive cough,  No non-productive cough,  No-  coughing up of blood.              No-   change in color of mucus.  No- wheezing.   Skin: No-   rash or lesions. GI:  No-   heartburn, indigestion, abdominal pain, nausea, vomiting,  GU:  MS:  No-   joint pain or swelling.   Neuro- nothing unusual Psych:  No- change in mood or affect. No depression or anxiety.  No memory loss.      Objective:   Physical Exam General- Alert, Oriented, Affect-appropriate, Distress- none acute    Exam unchanged- Skin- rash-none, lesions- none, excoriation- none Lymphadenopathy- none Head- atraumatic            Eyes- Gross vision intact, PERRLA, conjunctivae clear secretions            Ears- Hearing, canals            Nose- , +Septal dev, +mucus,  No-polyps, erosion, perforation. +Constant sniffing, +periorbital edema            Throat- Mallampati II , mucosa clear , drainage- none, tonsils- atrophic Neck- flexible , trachea midline, no stridor , thyroid nl, carotid no bruit Chest - symmetrical excursion , unlabored           Heart/CV- RRR , no murmur , no gallop  , no rub, nl s1 s2                           -  JVD- none , edema- none, stasis changes- none, varices- none           Lung- clear to P&A, wheeze- none, cough- mild , dullness-none, rub- none           Chest wall-  Abd-  Br/ Gen/ Rectal- Not done, not indicated Extrem- cyanosis- none, clubbing, none, atrophy- none, strength- nl Neuro- grossly intact to observation

## 2012-06-01 NOTE — Patient Instructions (Addendum)
We will continue present meds. Please call as needed  When we get a sample of Dymista in, I will try to reserve a sample for you to try.

## 2012-06-01 NOTE — Assessment & Plan Note (Signed)
Allergic and non-allergic rhinitis. She accepts present status as controlled. Plan- When available, we can let her try a sample of Dymista for comparison. She has used nasal steroids and nasal antihistamine sprays before.

## 2012-10-12 ENCOUNTER — Encounter: Payer: Self-pay | Admitting: Internal Medicine

## 2012-10-12 NOTE — Telephone Encounter (Signed)
Refill sent on 09-29-12 but pharmacy states they have not received this yet, so I gave a verbal for this. Pt advised.  Carron Curie, CMA

## 2012-10-19 ENCOUNTER — Other Ambulatory Visit: Payer: Self-pay | Admitting: Obstetrics and Gynecology

## 2012-12-02 ENCOUNTER — Ambulatory Visit: Payer: 59 | Admitting: Internal Medicine

## 2012-12-05 ENCOUNTER — Other Ambulatory Visit: Payer: Self-pay

## 2012-12-28 ENCOUNTER — Ambulatory Visit (INDEPENDENT_AMBULATORY_CARE_PROVIDER_SITE_OTHER): Payer: 59 | Admitting: Internal Medicine

## 2012-12-28 ENCOUNTER — Encounter: Payer: Self-pay | Admitting: Internal Medicine

## 2012-12-28 VITALS — BP 112/68 | HR 93 | Ht 64.0 in | Wt 113.2 lb

## 2012-12-28 DIAGNOSIS — J309 Allergic rhinitis, unspecified: Secondary | ICD-10-CM

## 2012-12-28 MED ORDER — AZELASTINE-FLUTICASONE 137-50 MCG/ACT NA SUSP: 2.0000 | Freq: Every day | NASAL | Status: DC

## 2012-12-28 MED ORDER — TRIAMCINOLONE ACETONIDE(NASAL) 55 MCG/ACT NA INHA: 2.0000 | Freq: Every day | NASAL | Status: DC

## 2012-12-28 NOTE — Progress Notes (Signed)
Patient ID: Mindy [name], female    DOB: 1961/01/10, 52 y.o.   MRN: [id number]  HPI 06/03/11- 29 yoF never smoker,  followed for allergic rhinitis complicated by hx sinusitis Last here May 01, 2010- Nose not working as well as she would like. DAllergy available by prescription, but costs too much. She is now using either Mucinex D or Allegra D and having more headaches, pressure behind eyes and frontal, sniffing. Dislikes Neti pot but hasn't tried squeeze bottle. Expects to be worse in fall allergy season. When she was on allergy shots, she was using the same amounts of meds- not better.  Previous cauterization and evaluation by Dr Annalee Genta helped some.   12/02/11- 50 yoF never smoker,  followed for allergic rhinitis complicated by hx sinusitis She is doing fairly well until she caught a cold 3 days ago. Scratchy throat frontal headache mild chest tightness. Has not had a bad flu syndrome this winter. Denies fever or, discolored sputum or significant sore throat. Chest remains clear with no wheeze or cough.  06/01/12- 50 yoF never smoker,  followed for allergic rhinitis complicated by hx sinusitis  Last week was rough-itchy watery eyes, congestion-nasal. Weather was humid and cloudy. . Recent days much better.  Usually sufficient Allegra-D and occ Sudafed with saline nasal spray. Only a couple of sinus infections in past year.  Does sometimes use Atrovent nasal spray and nasacort. Discussed available alternatives. Failed allergy vaccine. We discussed allergic vs non-allergic/ vasomotor rhinitis.  12/28/12- 51 yoF never smoker,  followed for allergic rhinitis complicated by hx sinusitis FOLLOWS FOR: nasal congestion, headaches, drainage, sneezing, and itchy, watery eyes. Experiencing her usual seasonal exacerbation but she says it is not worse than usual. We reviewed her experience with medications. She had failed a trial of allergy vaccine in the past. Interested in another try with  Dymista.  Review of Systems- see HPI Constitutional:   No-   weight loss, night sweats, fevers, chills, fatigue, lassitude. HEENT:   +  headaches,  No-difficulty swallowing, tooth/dental problems, sore throat,       + sneezing, itching, ear ache, +nasal congestion, post nasal drip,  CV:  No-   chest pain, orthopnea, PND, swelling in lower extremities, anasarca, dizziness, palpitations Resp: No-   shortness of breath with exertion or at rest.              No-   productive cough,  No non-productive cough,  No-  coughing up of blood.              No-   change in color of mucus.  No- wheezing.   Skin: No-   rash or lesions. GI:  No-   heartburn, indigestion, abdominal pain, nausea, vomiting,  GU:  MS:  No-   joint pain or swelling.   Neuro- nothing unusual Psych:  No- change in mood or affect. No depression or anxiety.  No memory loss.  Objective:   Physical Exam General- Alert, Oriented, Affect-appropriate, Distress- none acute    Exam unchanged- Skin- rash-none, lesions- none, excoriation- none Lymphadenopathy- none Head- atraumatic            Eyes- Gross vision intact, PERRLA, conjunctivae clear secretions            Ears- Hearing, canals            Nose- , +sniffing, +Septal dev, +mucus,  No-polyps, erosion, perforation. +Constant sniffing, +periorbital edema  Throat- Mallampati II , mucosa clear , drainage- none, tonsils- atrophic Neck- flexible , trachea midline, no stridor , thyroid nl, carotid no bruit Chest - symmetrical excursion , unlabored           Heart/CV- RRR , no murmur , no gallop  , no rub, nl s1 s2                           - JVD- none , edema- none, stasis changes- none, varices- none           Lung- clear to P&A, wheeze- none, cough- mild , dullness-none, rub- none           Chest wall-  Abd-  Br/ Gen/ Rectal- Not done, not indicated Extrem- cyanosis- none, clubbing, none, atrophy- none, strength- nl Neuro- grossly intact to observation

## 2012-12-28 NOTE — Patient Instructions (Addendum)
Script for Nasacort  Sample to retry Dymista nasal spray       1-2 puffs each nostril once daily at bedtime.   You can increase to twice daily if needed

## 2012-12-31 NOTE — Assessment & Plan Note (Signed)
Increased symptoms currently would fit with the beginning of tree pollen season. I think she has allergic and nonallergic components. Plan-try again with a sample of Dymista nasal spray. We compared that than Nasacort.

## 2013-01-17 ENCOUNTER — Encounter: Payer: Self-pay | Admitting: Internal Medicine

## 2013-01-18 ENCOUNTER — Other Ambulatory Visit: Payer: Self-pay | Admitting: *Deleted

## 2013-01-18 MED ORDER — AZELASTINE-FLUTICASONE 137-50 MCG/ACT NA SUSP: 2.0000 | Freq: Every day | NASAL | Status: DC

## 2013-03-24 ENCOUNTER — Encounter (HOSPITAL_COMMUNITY): Payer: Self-pay | Admitting: *Deleted

## 2013-03-24 ENCOUNTER — Emergency Department (HOSPITAL_COMMUNITY)
Admission: EM | Admit: 2013-03-24 | Discharge: 2013-03-24 | Disposition: A | Discharge status: Home / Self Care | Payer: 59 | Source: Home / Self Care | Attending: Family Medicine | Admitting: Family Medicine

## 2013-03-24 DIAGNOSIS — G43909 Migraine, unspecified, not intractable, without status migrainosus: Secondary | ICD-10-CM

## 2013-03-24 MED ORDER — METHYLPREDNISOLONE ACETATE 80 MG/ML IJ SUSP
INTRAMUSCULAR | Status: AC
  Filled 2013-03-24: qty 1

## 2013-03-24 MED ORDER — KETOROLAC TROMETHAMINE 30 MG/ML IJ SOLN
INTRAMUSCULAR | Status: AC
  Filled 2013-03-24: qty 1

## 2013-03-24 MED ORDER — ONDANSETRON 4 MG PO TBDP: 4.0000 mg | ORAL_TABLET | Freq: Once | ORAL | Status: DC

## 2013-03-24 MED ORDER — METHYLPREDNISOLONE ACETATE 40 MG/ML IJ SUSP
80.0000 mg | Freq: Once | INTRAMUSCULAR | Status: AC
  Administered 2013-03-24: 80 mg via INTRAMUSCULAR

## 2013-03-24 MED ORDER — ONDANSETRON 4 MG PO TBDP
8.0000 mg | ORAL_TABLET | Freq: Once | ORAL | Status: AC
  Administered 2013-03-24: 8 mg via ORAL

## 2013-03-24 MED ORDER — KETOROLAC TROMETHAMINE 30 MG/ML IJ SOLN
30.0000 mg | Freq: Once | INTRAMUSCULAR | Status: AC
  Administered 2013-03-24: 30 mg via INTRAMUSCULAR

## 2013-03-24 MED ORDER — ONDANSETRON 4 MG PO TBDP
ORAL_TABLET | ORAL | Status: AC
  Filled 2013-03-24: qty 2

## 2013-03-24 MED ORDER — ONDANSETRON HCL 4 MG PO TABS: 4.0000 mg | ORAL_TABLET | Freq: Four times a day (QID) | ORAL | Status: DC

## 2013-03-24 NOTE — ED Provider Notes (Signed)
History     CSN: [id number]  Arrival date & time 03/24/13  1643   First MD Initiated Contact with Patient 03/24/13 1710      Chief Complaint  Patient presents with  . Headache    (Consider location/radiation/quality/duration/timing/severity/associated sxs/prior treatment) Patient is a 52 y.o. female presenting with headaches. The history is provided by the patient.  Headache Pain location:  L parietal, L temporal and occipital Quality:  Dull Radiates to:  Does not radiate Duration:  1 day Progression:  Unchanged Chronicity:  New Similar to prior headaches: yes   Worsened by:  Light Associated symptoms: nausea and photophobia   Associated symptoms: no blurred vision, no pain, no fever and no numbness   Risk factors comment:  Sister with migraines   Past Medical History  Diagnosis Date  . Allergic rhinitis   . Migraine     Past Surgical History  Procedure Laterality Date  . Partial hysterectomy    . Neck surgery      Family History  Problem Relation Age of Onset  . Hypertension    . Dementia    . Arrhythmia Father     a fib  . Diabetes type II Father     History  Substance Use Topics  . Smoking status: Never Smoker   . Smokeless tobacco: Not on file  . Alcohol Use: Not on file    OB History   Grav Para Term Preterm Abortions TAB SAB Ect Mult Living                  Review of Systems  Constitutional: Negative.  Negative for fever.  Eyes: Positive for photophobia. Negative for blurred vision and pain.  Gastrointestinal: Positive for nausea.  Neurological: Positive for headaches. Negative for numbness.    Allergies  Oxaprozin  Home Medications   Current Outpatient Rx  Name  Route  Sig  Dispense  Refill  . Azelastine-Fluticasone (DYMISTA) 137-50 MCG/ACT SUSP   Each Nare   Place 2 sprays into both nostrils at bedtime.   1 Bottle   5   . butalbital-acetaminophen-caffeine (FIORICET) 50-325-40 MG per tablet      Tale 1 tablet by mouth every 6  hours as needed   75 tablet   3   . calcium-vitamin D (OSCAL WITH D) 500-200 MG-UNIT per tablet   Oral   Take 1 tablet by mouth 2 (two) times daily.           . fexofenadine-pseudoephedrine (ALLEGRA-D) 60-120 MG per tablet   Oral   Take 1 tablet by mouth 2 (two) times daily.         Marland Kitchen EXPIRED: ipratropium (ATROVENT) 0.06 % nasal spray   Nasal   Place 2 sprays into the nose 4 (four) times daily. As needed   15 mL   prn   . Naproxen Sodium (ALEVE) 220 MG CAPS   Oral   Take by mouth. As needed per bottle instructions         . ondansetron (ZOFRAN) 4 MG tablet   Oral   Take 1 tablet (4 mg total) by mouth every 6 (six) hours. Prn n/v   10 tablet   0   . oxymetazoline (AFRIN) 0.05 % nasal spray   Nasal   Place 2 sprays into the nose at bedtime as needed for congestion.         . pseudoephedrine (SUDAFED) 30 MG tablet   Oral   Take 30 mg by mouth every 4 (  four) hours as needed.         . sodium chloride (OCEAN) 0.65 % nasal spray   Nasal   Place 1 spray into the nose as needed for congestion.         . triamcinolone (NASACORT AQ) 55 MCG/ACT nasal inhaler   Nasal   Place 2 sprays into the nose daily.   1 Inhaler   3     BP 112/61  Pulse 62  Temp(Src) 97.9 F (36.6 C)  Resp 12  SpO2 100%  Physical Exam  Nursing note and vitals reviewed. Constitutional: She is oriented to person, place, and time. She appears well-developed and well-nourished. She appears distressed.  HENT:  Head: Normocephalic.  Right Ear: External ear normal.  Left Ear: External ear normal.  Mouth/Throat: Oropharynx is clear and moist.  Eyes: Conjunctivae and EOM are normal. Pupils are equal, round, and reactive to light.  Neck: Normal range of motion. Neck supple.  Lymphadenopathy:    She has no cervical adenopathy.  Neurological: She is alert and oriented to person, place, and time.  Skin: Skin is warm and dry.  Psychiatric: She has a normal mood and affect. Her behavior is  normal. Judgment and thought content normal.    ED Course  Procedures (including critical care time)  Labs Reviewed - No data to display No results found.   1. Migraine headache       MDM          Linna Hoff, MD 03/24/13 [phone]

## 2013-03-24 NOTE — ED Notes (Signed)
Pt  Reports  Symptoms  Of  Headache              With  Nausea     Since  Yesterday         Symptoms  Not  releived  By      otc  meds             Pt has  A  History  Of  Seasonal allergys

## 2013-04-26 ENCOUNTER — Telehealth: Payer: Self-pay | Admitting: Internal Medicine

## 2013-04-26 ENCOUNTER — Encounter: Payer: Self-pay | Admitting: Internal Medicine

## 2013-04-26 MED ORDER — BUTALBITAL-APAP-CAFFEINE 50-325-40 MG PO TABS: ORAL_TABLET | ORAL | Status: DC

## 2013-04-26 MED ORDER — BUTALBITAL-ASPIRIN-CAFFEINE 50-325-40 MG PO CAPS: 1.0000 | ORAL_CAPSULE | Freq: Four times a day (QID) | ORAL | Status: DC | PRN

## 2013-04-26 NOTE — Telephone Encounter (Signed)
Rx has been filled per Dr. Maple Hudson Email sent to patient. Nothing further at this time

## 2013-04-26 NOTE — Telephone Encounter (Signed)
Per CY ok to fill. Email sent to patient to inform her of this and medication called in.

## 2013-04-26 NOTE — Telephone Encounter (Signed)
Received a MyChart message from the pt requesting a refill on Fiorcet.  Last OV 12/28/2012 Pending OV 06/28/2013 Last fill 04/21/12 #75 with 3 additional refills.  Allergies  Allergen Reactions  . Oxaprozin     Pharmacy -  Outpatient  [facility] - please advise. Thanks.

## 2013-06-28 ENCOUNTER — Encounter: Payer: Self-pay | Admitting: Internal Medicine

## 2013-06-28 ENCOUNTER — Ambulatory Visit (INDEPENDENT_AMBULATORY_CARE_PROVIDER_SITE_OTHER): Payer: 59 | Admitting: Internal Medicine

## 2013-06-28 VITALS — BP 110/82 | HR 71 | Ht 64.0 in | Wt 118.2 lb

## 2013-06-28 DIAGNOSIS — J309 Allergic rhinitis, unspecified: Secondary | ICD-10-CM

## 2013-06-28 DIAGNOSIS — J300 Vasomotor rhinitis: Secondary | ICD-10-CM

## 2013-06-28 MED ORDER — METHYLPREDNISOLONE ACETATE 80 MG/ML IJ SUSP
80.0000 mg | Freq: Once | INTRAMUSCULAR | Status: AC
  Administered 2013-06-28: 80 mg via INTRAMUSCULAR

## 2013-06-28 MED ORDER — PHENYLEPHRINE HCL 1 % NA SOLN
3.0000 [drp] | Freq: Once | NASAL | Status: AC
  Administered 2013-06-28: 3 [drp] via NASAL

## 2013-06-28 NOTE — Patient Instructions (Addendum)
Neb neo nasal  Depo 80  Please call as needed 
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
## 2013-09-13 ENCOUNTER — Ambulatory Visit (INDEPENDENT_AMBULATORY_CARE_PROVIDER_SITE_OTHER): Payer: 59 | Admitting: Sports Medicine

## 2013-09-13 ENCOUNTER — Encounter: Payer: Self-pay | Admitting: Sports Medicine

## 2013-09-13 VITALS — BP 126/85 | Ht 64.0 in | Wt 120.0 lb

## 2013-09-13 DIAGNOSIS — M25579 Pain in unspecified ankle and joints of unspecified foot: Secondary | ICD-10-CM
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
## 2013-09-19 ENCOUNTER — Emergency Department (HOSPITAL_BASED_OUTPATIENT_CLINIC_OR_DEPARTMENT_OTHER): Payer: 59

## 2013-09-19 ENCOUNTER — Encounter (HOSPITAL_BASED_OUTPATIENT_CLINIC_OR_DEPARTMENT_OTHER): Payer: Self-pay | Admitting: Emergency Medicine

## 2013-09-19 ENCOUNTER — Inpatient Hospital Stay (HOSPITAL_BASED_OUTPATIENT_CLINIC_OR_DEPARTMENT_OTHER)
Admission: EM | Admit: 2013-09-19 | Discharge: 2013-09-21 | DRG: 391 | Disposition: A | Discharge status: Home / Self Care | Payer: 59 | Attending: Internal Medicine | Admitting: Internal Medicine

## 2013-09-19 DIAGNOSIS — R7401 Elevation of levels of liver transaminase levels: Secondary | ICD-10-CM

## 2013-09-19 DIAGNOSIS — K429 Umbilical hernia without obstruction or gangrene: Secondary | ICD-10-CM | POA: Diagnosis present

## 2013-09-19 DIAGNOSIS — R109 Unspecified abdominal pain: Secondary | ICD-10-CM

## 2013-09-19 DIAGNOSIS — K259 Gastric ulcer, unspecified as acute or chronic, without hemorrhage or perforation: Secondary | ICD-10-CM | POA: Diagnosis present

## 2013-09-19 DIAGNOSIS — R7989 Other specified abnormal findings of blood chemistry: Secondary | ICD-10-CM | POA: Diagnosis present

## 2013-09-19 DIAGNOSIS — J0100 Acute maxillary sinusitis, unspecified: Secondary | ICD-10-CM

## 2013-09-19 DIAGNOSIS — K760 Fatty (change of) liver, not elsewhere classified: Secondary | ICD-10-CM

## 2013-09-19 DIAGNOSIS — K859 Acute pancreatitis without necrosis or infection, unspecified: Secondary | ICD-10-CM | POA: Diagnosis present

## 2013-09-19 DIAGNOSIS — J309 Allergic rhinitis, unspecified: Secondary | ICD-10-CM | POA: Diagnosis present

## 2013-09-19 DIAGNOSIS — K5732 Diverticulitis of large intestine without perforation or abscess without bleeding: Secondary | ICD-10-CM | POA: Diagnosis present

## 2013-09-19 DIAGNOSIS — Z79899 Other long term (current) drug therapy: Secondary | ICD-10-CM

## 2013-09-19 DIAGNOSIS — R079 Chest pain, unspecified: Secondary | ICD-10-CM

## 2013-09-19 DIAGNOSIS — R1013 Epigastric pain: Principal | ICD-10-CM | POA: Diagnosis present

## 2013-09-19 DIAGNOSIS — J300 Vasomotor rhinitis: Secondary | ICD-10-CM

## 2013-09-19 DIAGNOSIS — R0789 Other chest pain: Secondary | ICD-10-CM

## 2013-09-19 DIAGNOSIS — R748 Abnormal levels of other serum enzymes: Secondary | ICD-10-CM

## 2013-09-19 DIAGNOSIS — K2970 Gastritis, unspecified, without bleeding: Secondary | ICD-10-CM | POA: Diagnosis present

## 2013-09-19 LAB — CBC WITH DIFFERENTIAL/PLATELET
Basophils Absolute: 0 10*3/uL (ref 0.0–0.1)
Basophils Relative: 0 % (ref 0–1)
Eosinophils Absolute: 0.1 10*3/uL (ref 0.0–0.7)
Eosinophils Relative: 1 % (ref 0–5)
HCT: 40.2 % (ref 36.0–46.0)
Hemoglobin: 13.8 g/dL (ref 12.0–15.0)
Lymphocytes Relative: 8 % — ABNORMAL LOW (ref 12–46)
Lymphs Abs: 0.4 10*3/uL — ABNORMAL LOW (ref 0.7–4.0)
MCH: 32.5 pg (ref 26.0–34.0)
MCHC: 34.3 g/dL (ref 30.0–36.0)
MCV: 94.6 fL (ref 78.0–100.0)
Monocytes Absolute: 0.1 10*3/uL (ref 0.1–1.0)
Monocytes Relative: 3 % (ref 3–12)
Neutro Abs: 4.8 10*3/uL (ref 1.7–7.7)
Neutrophils Relative %: 88 % — ABNORMAL HIGH (ref 43–77)
Platelets: 196 10*3/uL (ref 150–400)
RBC: 4.25 MIL/uL (ref 3.87–5.11)
RDW: 11.7 % (ref 11.5–15.5)
WBC: 5.5 10*3/uL (ref 4.0–10.5)

## 2013-09-19 MED ORDER — ONDANSETRON HCL 4 MG/2ML IJ SOLN
4.0000 mg | Freq: Once | INTRAMUSCULAR | Status: AC
  Administered 2013-09-19: 4 mg via INTRAVENOUS
  Filled 2013-09-19: qty 2

## 2013-09-19 MED ORDER — HYDROMORPHONE HCL PF 1 MG/ML IJ SOLN
1.0000 mg | Freq: Once | INTRAMUSCULAR | Status: AC
  Administered 2013-09-19: 1 mg via INTRAVENOUS
  Filled 2013-09-19: qty 1

## 2013-09-19 MED ORDER — SODIUM CHLORIDE 0.9 % IV SOLN
INTRAVENOUS | Status: DC
  Administered 2013-09-19: via INTRAVENOUS

## 2013-09-19 NOTE — ED Provider Notes (Signed)
CSN: [id number]     Arrival date & time 09/19/13  2305 History  This chart was scribed for Mindy Seamen, MD by Dorothey Baseman, ED Scribe. This patient was seen in room MH04/MH04 and the patient's care was started at 11:21 PM.    Chief Complaint  Patient presents with  . Chest Pain   The history is provided by the patient. No language interpreter was used.   HPI Comments: Mindy [name] is a 52 y.o. female who presents to the Emergency Department complaining of a constant pain to the lower thoracic back onset about an hour ago while she was laying down at rest. She reports a constant, severe, sharp pain to the chest, just under the left breast. Patient reports that the pain is exacerbated with deep breathing and alleviated with leaning forward. She reports some associated, intermittent nausea that is exacerbated with movement. She denies shortness of breath. Patient has a history of migraines.   Past Medical History  Diagnosis Date  . Allergic rhinitis   . Migraine    Past Surgical History  Procedure Laterality Date  . Partial hysterectomy    . Neck surgery     Family History  Problem Relation Age of Onset  . Hypertension    . Dementia    . Arrhythmia Father     a fib  . Diabetes type II Father    History  Substance Use Topics  . Smoking status: Never Smoker   . Smokeless tobacco: Not on file  . Alcohol Use: Not on file   OB History   Grav Para Term Preterm Abortions TAB SAB Ect Mult Living                 Review of Systems  A complete 10 system review of systems was obtained and all systems are negative except as noted in the HPI and PMH.   Allergies  Oxaprozin  Home Medications   Current Outpatient Rx  Name  Route  Sig  Dispense  Refill  . Azelastine-Fluticasone (DYMISTA) 137-50 MCG/ACT SUSP   Each Nare   Place 2 sprays into both nostrils at bedtime.   1 Bottle   5   . BLACK COHOSH PO   Oral   Take 1 capsule by mouth 2 (two) times daily.         .  butalbital-acetaminophen-caffeine (FIORICET) 50-325-40 MG per tablet      Tale 1 tablet by mouth every 6 hours as needed   75 tablet   3   . calcium-vitamin D (OSCAL WITH D) 500-200 MG-UNIT per tablet   Oral   Take 1 tablet by mouth 2 (two) times daily.           . fexofenadine-pseudoephedrine (ALLEGRA-D) 60-120 MG per tablet   Oral   Take 1 tablet by mouth 2 (two) times daily.         . Naproxen Sodium (ALEVE) 220 MG CAPS   Oral   Take by mouth. As needed per bottle instructions         . ondansetron (ZOFRAN) 4 MG tablet   Oral   Take 1 tablet (4 mg total) by mouth every 6 (six) hours. Prn n/v   10 tablet   0   . oxymetazoline (AFRIN) 0.05 % nasal spray   Nasal   Place 2 sprays into the nose at bedtime as needed for congestion.         . pseudoephedrine (SUDAFED) 30 MG tablet  Oral   Take 30 mg by mouth every 4 (four) hours as needed.         . sodium chloride (OCEAN) 0.65 % nasal spray   Nasal   Place 1 spray into the nose as needed for congestion.          Triage Vitals: BP 150/59  Pulse 96  Temp(Src) 97.7 F (36.5 C) (Oral)  SpO2 100%  Physical Exam  Nursing note and vitals reviewed. Constitutional: She is oriented to person, place, and time. She appears well-developed and well-nourished. No distress.  Patient is tearful and appears uncomfortable.   HENT:  Head: Normocephalic and atraumatic.  Eyes: Conjunctivae are normal.  Neck: Normal range of motion. Neck supple.  Cardiovascular: Normal rate, regular rhythm, normal heart sounds and intact distal pulses.  Exam reveals no gallop and no friction rub.   No murmur heard. Pulses:      Radial pulses are 2+ on the right side, and 2+ on the left side.       Dorsalis pedis pulses are 2+ on the right side, and 2+ on the left side.  Pulmonary/Chest: Effort normal and breath sounds normal. No respiratory distress. She has no wheezes. She has no rales. She exhibits tenderness.  Tachypneic. Tenderness to  palpation to the chest wall under the left breast.  Abdominal: Soft. Bowel sounds are normal. She exhibits no distension. There is tenderness.  Significant epigastric tenderness to palpation.   Musculoskeletal: Normal range of motion. She exhibits tenderness.  Tenderness to palpation across the mid back.   Neurological: She is alert and oriented to person, place, and time.  Skin: Skin is warm and dry.  Psychiatric: She has a normal mood and affect. Her behavior is normal.    ED Course  Procedures (including critical care time)  DIAGNOSTIC STUDIES: Oxygen Saturation is 100% on room air, normal by my interpretation.    COORDINATION OF CARE: 11:27 PM- Ordered an EKG and chest x-ray. Will order Dilaudid and Zofran to manage symptoms. Discussed treatment plan with patient at bedside and patient verbalized agreement.    12:49 AM- Ordered CT angio of the abdomen/pelvis and chest. Discussed lab results. Discussed treatment plan with patient at bedside and patient verbalized agreement.     EKG Interpretation    Date/Time:  Sunday September 19 2013 23:19:28 EST Ventricular Rate:  101 PR Interval:  144 QRS Duration: 72 QT Interval:  350 QTC Calculation: 453 R Axis:   71 Text Interpretation:  Sinus tachycardia Possible Left atrial enlargement Borderline ECG No old tracing to compare Confirmed by Woodford Strege  MD, Makinsey Pepitone ([id number]) on 09/19/2013 11:29:23 PM      MDM   Nursing notes and vitals signs, including pulse oximetry, reviewed.  Summary of this visit's results, reviewed by myself:  Labs:  Results for orders placed during the hospital encounter of 09/19/13 (from the past 24 hour(s))  CBC WITH DIFFERENTIAL     Status: Abnormal   Collection Time    09/19/13 11:30 PM      Result Value Range   WBC 5.5  4.0 - 10.5 K/uL   RBC 4.25  3.87 - 5.11 MIL/uL   Hemoglobin 13.8  12.0 - 15.0 g/dL   HCT 40.9  81.1 - 91.4 %   MCV 94.6  78.0 - 100.0 fL   MCH 32.5  26.0 - 34.0 pg   MCHC 34.3  30.0 -  36.0 g/dL   RDW 78.2  95.6 - 21.3 %   Platelets 196  150 - 400 K/uL   Neutrophils Relative % 88 (*) 43 - 77 %   Neutro Abs 4.8  1.7 - 7.7 K/uL   Lymphocytes Relative 8 (*) 12 - 46 %   Lymphs Abs 0.4 (*) 0.7 - 4.0 K/uL   Monocytes Relative 3  3 - 12 %   Monocytes Absolute 0.1  0.1 - 1.0 K/uL   Eosinophils Relative 1  0 - 5 %   Eosinophils Absolute 0.1  0.0 - 0.7 K/uL   Basophils Relative 0  0 - 1 %   Basophils Absolute 0.0  0.0 - 0.1 K/uL  COMPREHENSIVE METABOLIC PANEL     Status: Abnormal   Collection Time    09/19/13 11:30 PM      Result Value Range   Sodium 141  135 - 145 mEq/L   Potassium 3.6  3.5 - 5.1 mEq/L   Chloride 102  96 - 112 mEq/L   CO2 26  19 - 32 mEq/L   Glucose, Bld 127 (*) 70 - 99 mg/dL   BUN 17  6 - 23 mg/dL   Creatinine, Ser 4.13  0.50 - 1.10 mg/dL   Calcium 9.5  8.4 - 24.4 mg/dL   Total Protein 6.9  6.0 - 8.3 g/dL   Albumin 4.3  3.5 - 5.2 g/dL   AST 010 (*) 0 - 37 U/L   ALT 228 (*) 0 - 35 U/L   Alkaline Phosphatase 91  39 - 117 U/L   Total Bilirubin 0.4  0.3 - 1.2 mg/dL   GFR calc non Af Amer >90  >90 mL/min   GFR calc Af Amer >90  >90 mL/min  LIPASE, BLOOD     Status: Abnormal   Collection Time    09/19/13 11:30 PM      Result Value Range   Lipase 100 (*) 11 - 59 U/L  D-DIMER, QUANTITATIVE     Status: None   Collection Time    09/19/13 11:30 PM      Result Value Range   D-Dimer, Quant <0.27  0.00 - 0.48 ug/mL-FEU  TROPONIN I     Status: None   Collection Time    09/19/13 11:30 PM      Result Value Range   Troponin I <0.30  <0.30 ng/mL    Imaging Studies: Dg Chest 2 View  09/20/2013   CLINICAL DATA:  Chest and back pain.  EXAM: CHEST  2 VIEW  COMPARISON:  None  FINDINGS: Lower cervical spine fixation. Midline trachea. Normal heart size and mediastinal contours. No pleural effusion or pneumothorax. Diffuse peribronchial thickening. Clear lungs.  IMPRESSION: No acute cardiopulmonary disease. Interstitial thickening, possibly related to chronic  bronchitis or asthma.   Electronically Signed   By: Jeronimo Greaves M.D.   On: 09/20/2013 01:09   Ct Angio Chest Pe W/cm &/or Wo Cm  09/20/2013   *RADIOLOGY REPORT*  Clinical Data:  Abdominal and chest pain  CT ANGIOGRAPHY CHEST ABDOMEN PELVIS  Technique:  Multidetector CT imaging of the chest, abdomen, and pelvis was performed using the standard protocol during bolus administration of intravenous contrast.  Multiplanar CT image reconstructions including MIPs were obtained to evaluate the vascular anatomy.  Contrast: OMNIPAQUE IOHEXOL 350 MG/ML SOLN  Comparison:   None.  CTA CHEST  Findings:  Normal caliber aorta.  No aneurysmal dilatation or dissection.  Pulmonary arterial branches are patent.  Normal heart size.  No pleural or pericardial effusion.  No intrathoracic lymphadenopathy.  Central airways are patent.  No pneumothorax.  Biapical scarring. A couple small fissural nodes.  Partially imaged cervical fusion hardware.  No acute osseous finding.   Review of the MIP images confirms the above findings.  IMPRESSION: No pulmonary embolism, aortic dissection, or acute intrathoracic process.  CTA ABDOMEN PELVIS  Findings:  Organ evaluation is suboptimal in the arterial phase. Within this limitation, low attenuation of the liver can be seen with fatty infiltration.  No radiodense gallstones.  No biliary ductal dilatation.  Unremarkable pancreas, spleen, adrenal glands. Symmetric renal enhancement.  No hydroureteronephrosis.  Mild colonic diverticulosis.  No CT evidence for colitis or diverticulitis.  Appendix normal.  Small bowel loops are normal course and caliber.  Tiny fat containing umbilical hernia.  No free intraperitoneal air or fluid.  No lymphadenopathy.  Normal caliber aorta and branch vessels.  Patent celiac axis, SMA, single renal arteries, and IMA.  Normal caliber common, internal, and external iliac arteries.  Normal caliber common femoral and proximal portions of the superficial femoral and  profunda femoral arteries.  Thin-walled bladder.  Absent uterus.  No adnexal mass.  No acute osseous finding.   Review of the MIP images confirms the above findings.  IMPRESSION: Normal caliber abdominal aorta and branch vessels.  No acute abdominopelvic process identified by CT.   Original Report Authenticated By: Jearld Lesch, M.D.   Ct Angio Abd/pel W/ And/or W/o  09/20/2013   *RADIOLOGY REPORT*  Clinical Data:  Abdominal and chest pain  CT ANGIOGRAPHY CHEST ABDOMEN PELVIS  Technique:  Multidetector CT imaging of the chest, abdomen, and pelvis was performed using the standard protocol during bolus administration of intravenous contrast.  Multiplanar CT image reconstructions including MIPs were obtained to evaluate the vascular anatomy.  Contrast: OMNIPAQUE IOHEXOL 350 MG/ML SOLN  Comparison:   None.  CTA CHEST  Findings:  Normal caliber aorta.  No aneurysmal dilatation or dissection.  Pulmonary arterial branches are patent.  Normal heart size.  No pleural or pericardial effusion.  No intrathoracic lymphadenopathy.  Central airways are patent.  No pneumothorax.  Biapical scarring. A couple small fissural nodes.  Partially imaged cervical fusion hardware.  No acute osseous finding.   Review of the MIP images confirms the above findings.  IMPRESSION: No pulmonary embolism, aortic dissection, or acute intrathoracic process.  CTA ABDOMEN PELVIS  Findings:  Organ evaluation is suboptimal in the arterial phase. Within this limitation, low attenuation of the liver can be seen with fatty infiltration.  No radiodense gallstones.  No biliary ductal dilatation.  Unremarkable pancreas, spleen, adrenal glands. Symmetric renal enhancement.  No hydroureteronephrosis.  Mild colonic diverticulosis.  No CT evidence for colitis or diverticulitis.  Appendix normal.  Small bowel loops are normal course and caliber.  Tiny fat containing umbilical hernia.  No free intraperitoneal air or fluid.  No lymphadenopathy.  Normal  caliber aorta and branch vessels.  Patent celiac axis, SMA, single renal arteries, and IMA.  Normal caliber common, internal, and external iliac arteries.  Normal caliber common femoral and proximal portions of the superficial femoral and profunda femoral arteries.  Thin-walled bladder.  Absent uterus.  No adnexal mass.  No acute osseous finding.   Review of the MIP images confirms the above findings.  IMPRESSION: Normal caliber abdominal aorta and branch vessels.  No acute abdominopelvic process identified by CT.   Original Report Authenticated By: Jearld Lesch, M.D.   3:41 AM Suspect early pancreatitis. Patient denies alcohol use. [facility] to admit. Pain and nausea controlled with IV medications.  I personally performed the services described in this documentation, which was scribed in my presence.  The recorded information has been reviewed and considered.     Mindy Seamen, MD 09/20/13 [phone]

## 2013-09-19 NOTE — ED Notes (Signed)
Pt reports was lying in bed and had sudden onset of sharp chest pain that increases with deep breathing

## 2013-09-19 NOTE — ED Notes (Signed)
Transported to xray 

## 2013-09-19 NOTE — ED Notes (Signed)
Returned from xray

## 2013-09-20 ENCOUNTER — Encounter (HOSPITAL_COMMUNITY): Payer: Self-pay | Admitting: Nurse Practitioner

## 2013-09-20 ENCOUNTER — Inpatient Hospital Stay (HOSPITAL_COMMUNITY): Payer: 59

## 2013-09-20 ENCOUNTER — Emergency Department (HOSPITAL_BASED_OUTPATIENT_CLINIC_OR_DEPARTMENT_OTHER): Payer: 59

## 2013-09-20 DIAGNOSIS — R748 Abnormal levels of other serum enzymes: Secondary | ICD-10-CM

## 2013-09-20 DIAGNOSIS — R109 Unspecified abdominal pain: Secondary | ICD-10-CM

## 2013-09-20 DIAGNOSIS — R7401 Elevation of levels of liver transaminase levels: Secondary | ICD-10-CM

## 2013-09-20 DIAGNOSIS — R1013 Epigastric pain: Secondary | ICD-10-CM | POA: Diagnosis present

## 2013-09-20 DIAGNOSIS — R0789 Other chest pain: Secondary | ICD-10-CM

## 2013-09-20 HISTORY — DX: Elevation of levels of liver transaminase levels: R74.01

## 2013-09-20 LAB — HEPATIC FUNCTION PANEL
ALT: 851 U/L — ABNORMAL HIGH (ref 0–35)
AST: 1062 U/L — ABNORMAL HIGH (ref 0–37)
Albumin: 3.7 g/dL (ref 3.5–5.2)
Alkaline Phosphatase: 106 U/L (ref 39–117)
Bilirubin, Direct: 0.2 mg/dL (ref 0.0–0.3)
Indirect Bilirubin: 0 mg/dL — ABNORMAL LOW (ref 0.3–0.9)
Total Bilirubin: 0.2 mg/dL — ABNORMAL LOW (ref 0.3–1.2)
Total Protein: 6.2 g/dL (ref 6.0–8.3)

## 2013-09-20 LAB — BASIC METABOLIC PANEL
BUN: 11 mg/dL (ref 6–23)
CO2: 28 mEq/L (ref 19–32)
Calcium: 8.4 mg/dL (ref 8.4–10.5)
Chloride: 106 mEq/L (ref 96–112)
Creatinine, Ser: 0.68 mg/dL (ref 0.50–1.10)
GFR calc Af Amer: >90 mL/min (ref >90)
GFR calc non Af Amer: >90 mL/min (ref >90)
Glucose, Bld: 121 mg/dL — ABNORMAL HIGH (ref 70–99)
Potassium: 4.4 mEq/L (ref 3.5–5.1)
Sodium: 141 mEq/L (ref 135–145)

## 2013-09-20 LAB — COMPREHENSIVE METABOLIC PANEL
ALT: 228 U/L — ABNORMAL HIGH (ref 0–35)
AST: 361 U/L — ABNORMAL HIGH (ref 0–37)
Albumin: 4.3 g/dL (ref 3.5–5.2)
Alkaline Phosphatase: 91 U/L (ref 39–117)
BUN: 17 mg/dL (ref 6–23)
CO2: 26 mEq/L (ref 19–32)
Calcium: 9.5 mg/dL (ref 8.4–10.5)
Chloride: 102 mEq/L (ref 96–112)
Creatinine, Ser: 0.7 mg/dL (ref 0.50–1.10)
GFR calc Af Amer: >90 mL/min (ref >90)
GFR calc non Af Amer: >90 mL/min (ref >90)
Glucose, Bld: 127 mg/dL — ABNORMAL HIGH (ref 70–99)
Potassium: 3.6 mEq/L (ref 3.5–5.1)
Sodium: 141 mEq/L (ref 135–145)
Total Bilirubin: 0.4 mg/dL (ref 0.3–1.2)
Total Protein: 6.9 g/dL (ref 6.0–8.3)

## 2013-09-20 LAB — CBC WITH DIFFERENTIAL/PLATELET
Basophils Absolute: 0 10*3/uL (ref 0.0–0.1)
Basophils Relative: 0 % (ref 0–1)
Eosinophils Absolute: 0 10*3/uL (ref 0.0–0.7)
Eosinophils Relative: 0 % (ref 0–5)
HCT: 38 % (ref 36.0–46.0)
Hemoglobin: 13.2 g/dL (ref 12.0–15.0)
Lymphocytes Relative: 10 % — ABNORMAL LOW (ref 12–46)
Lymphs Abs: 0.3 10*3/uL — ABNORMAL LOW (ref 0.7–4.0)
MCH: 32.6 pg (ref 26.0–34.0)
MCHC: 34.7 g/dL (ref 30.0–36.0)
MCV: 93.8 fL (ref 78.0–100.0)
Monocytes Absolute: 0.2 10*3/uL (ref 0.1–1.0)
Monocytes Relative: 7 % (ref 3–12)
Neutro Abs: 2.7 10*3/uL (ref 1.7–7.7)
Neutrophils Relative %: 83 % — ABNORMAL HIGH (ref 43–77)
Platelets: 193 10*3/uL (ref 150–400)
RBC: 4.05 MIL/uL (ref 3.87–5.11)
RDW: 12.1 % (ref 11.5–15.5)
WBC: 3.3 10*3/uL — ABNORMAL LOW (ref 4.0–10.5)

## 2013-09-20 LAB — HEPATITIS PANEL, ACUTE
HCV Ab: NEGATIVE
Hep A IgM: NONREACTIVE
Hep B C IgM: NONREACTIVE
Hepatitis B Surface Ag: NEGATIVE

## 2013-09-20 LAB — LIPID PANEL
Cholesterol: 154 mg/dL (ref 0–200)
HDL: 70 mg/dL (ref >39)
LDL Cholesterol: 76 mg/dL (ref 0–99)
Total CHOL/HDL Ratio: 2.2 RATIO
Triglycerides: 40 mg/dL (ref <150)
VLDL: 8 mg/dL (ref 0–40)

## 2013-09-20 LAB — ACETAMINOPHEN LEVEL: Acetaminophen (Tylenol), Serum: <15 ug/mL (ref 10–30)

## 2013-09-20 LAB — LIPASE, BLOOD
Lipase: 100 U/L — ABNORMAL HIGH (ref 11–59)
Lipase: 33 U/L (ref 11–59)

## 2013-09-20 LAB — D-DIMER, QUANTITATIVE: D-Dimer, Quant: <0.27 ug/mL-FEU (ref 0.00–0.48)

## 2013-09-20 LAB — PROTIME-INR
INR: 1.1 (ref 0.00–1.49)
Prothrombin Time: 14 seconds (ref 11.6–15.2)

## 2013-09-20 LAB — TSH: TSH: 2.726 u[IU]/mL (ref 0.350–4.500)

## 2013-09-20 LAB — TROPONIN I
Troponin I: <0.3 ng/mL (ref <0.30)
Troponin I: <0.3 ng/mL (ref <0.30)

## 2013-09-20 MED ORDER — ONDANSETRON HCL 4 MG/2ML IJ SOLN
4.0000 mg | Freq: Once | INTRAMUSCULAR | Status: AC
  Administered 2013-09-20: 4 mg via INTRAVENOUS
  Filled 2013-09-20: qty 2

## 2013-09-20 MED ORDER — HYDROMORPHONE HCL PF 1 MG/ML IJ SOLN
1.0000 mg | INTRAMUSCULAR | Status: AC | PRN
  Administered 2013-09-20: 1 mg via INTRAVENOUS
  Filled 2013-09-20: qty 1

## 2013-09-20 MED ORDER — MORPHINE SULFATE 4 MG/ML IJ SOLN
4.0000 mg | Freq: Once | INTRAMUSCULAR | Status: AC
  Administered 2013-09-20: 4 mg via INTRAVENOUS
  Filled 2013-09-20: qty 1

## 2013-09-20 MED ORDER — ONDANSETRON HCL 4 MG PO TABS
4.0000 mg | ORAL_TABLET | Freq: Four times a day (QID) | ORAL | Status: DC | PRN
  Administered 2013-09-20: 4 mg via ORAL
  Filled 2013-09-20: qty 1

## 2013-09-20 MED ORDER — PANTOPRAZOLE SODIUM 40 MG IV SOLR
40.0000 mg | Freq: Two times a day (BID) | INTRAVENOUS | Status: DC
  Administered 2013-09-20 – 2013-09-21 (×2): 40 mg via INTRAVENOUS
  Filled 2013-09-20 (×4): qty 40

## 2013-09-20 MED ORDER — OXYCODONE HCL 5 MG PO TABS
5.0000 mg | ORAL_TABLET | ORAL | Status: DC | PRN
  Administered 2013-09-20 – 2013-09-21 (×4): 5 mg via ORAL
  Filled 2013-09-20 (×4): qty 1

## 2013-09-20 MED ORDER — SODIUM CHLORIDE 0.9 % IJ SOLN: 3.0000 mL | Freq: Two times a day (BID) | INTRAMUSCULAR | Status: DC

## 2013-09-20 MED ORDER — HYDROMORPHONE HCL PF 1 MG/ML IJ SOLN
1.0000 mg | Freq: Once | INTRAMUSCULAR | Status: AC
  Administered 2013-09-20: 1 mg via INTRAVENOUS
  Filled 2013-09-20: qty 1

## 2013-09-20 MED ORDER — ONDANSETRON HCL 4 MG/2ML IJ SOLN
4.0000 mg | Freq: Four times a day (QID) | INTRAMUSCULAR | Status: DC | PRN
  Administered 2013-09-20 – 2013-09-21 (×3): 4 mg via INTRAVENOUS
  Filled 2013-09-20 (×3): qty 2

## 2013-09-20 MED ORDER — SODIUM CHLORIDE 0.9 % IV SOLN
INTRAVENOUS | Status: AC
  Administered 2013-09-20: 03:00:00 via INTRAVENOUS

## 2013-09-20 MED ORDER — PANTOPRAZOLE SODIUM 40 MG IV SOLR
40.0000 mg | INTRAVENOUS | Status: DC
  Administered 2013-09-20: 40 mg via INTRAVENOUS
  Filled 2013-09-20: qty 40

## 2013-09-20 MED ORDER — SODIUM CHLORIDE 0.9 % IV SOLN
INTRAVENOUS | Status: AC
  Administered 2013-09-20 – 2013-09-21 (×3): via INTRAVENOUS

## 2013-09-20 MED ORDER — ONDANSETRON HCL 4 MG/2ML IJ SOLN: 4.0000 mg | Freq: Three times a day (TID) | INTRAMUSCULAR | Status: DC | PRN

## 2013-09-20 MED ORDER — IOHEXOL 350 MG/ML SOLN
100.0000 mL | Freq: Once | INTRAVENOUS | Status: AC | PRN
  Administered 2013-09-20: 100 mL via INTRAVENOUS

## 2013-09-20 NOTE — Progress Notes (Signed)
   CARE MANAGEMENT NOTE 09/20/2013  Patient:  Mindy [name], Mindy [name]   Account Number:  [id number]  Date Initiated:  09/20/2013  Documentation initiated by:  Darlyne Russian  Subjective/Objective Assessment:   admitted with epigastric, abdominal pain     Action/Plan:   progression of care and discharge planning   Anticipated DC Date:  09/23/2013   Anticipated DC Plan:  HOME/SELF CARE         Choice offered to / List presented to:             Status of service:  In process, will continue to follow Medicare Important Message given?   (If response is "NO", the following Medicare IM given date fields will be blank) Date Medicare IM given:   Date Additional Medicare IM given:    Discharge Disposition:    Per UR Regulation:  Reviewed for med. necessity/level of care/duration of stay  If discussed at Long Length of Stay Meetings, dates discussed:    Comments:

## 2013-09-20 NOTE — ED Notes (Signed)
Franne Forts = [phone], [phone] - May call with any concerns or updates

## 2013-09-20 NOTE — ED Notes (Signed)
Bed assignment [id number] [phone]

## 2013-09-20 NOTE — Progress Notes (Signed)
Mindy [name] [id number] Admitted to 1H08: 09/20/2013 5:52 AM Attending Provider: Eduard Clos, MD   History:  obtained from the patient.  Pt orientation to unit, room and routine. Information packet given to patient.  Admission INP armband ID verified with patient and in place. SR up x 2, fall risk assessment complete with Patient verbalizing understanding of risks associated with falls. Pt verbalizes an understanding of how to use the call bell and to call for help before getting out of bed.     Will cont to monitor and assist as needed.  Gilman Schmidt, RN 09/20/2013 5:52 AM

## 2013-09-20 NOTE — Consult Note (Signed)
Unassigned patient Reason for Consult: Abnormal LFT's. Referring Physician: Dr. Lambert Keto.  Mindy [name] is an 52 y.o. female.  HPI: 52 year old white female, who was in her USOH, had some soup and cracker after a run at about 5:30 pm last night and developed severe epigastric pain at about 9 PM. She went to the [facility] and was given 3 doses of Dilaudid before her abdominal pain subsided. She got another dose of Dilaudid this morning but has not had any more abdominal pain since then. The pain radiated to her chest and back as well. She claims she has a severe headache now. She has never had abdominal pain like this before. She denies having any nausea, vomiting, diarrhea, BRBPR or melena. She has occasional bouts of constipation when she takes her anti-allergy medications. She was evaluated by an abdominal ultrasound that revealed a fatty liver and a CT angiogram that revealed colonic diverticulosis and a small umbilical hernia. She claims at this point her abdominal pain has resolved completely and so has her chest pain. A GI consult was requested as her LFT's have risen since admission when her AST/ALT were 361/228 and are now 1062/851 with a normal alkaline phosphatase and bilrubin. Her Lipase was minimally elevated at a 100 on admission.  Past Medical History  Diagnosis Date  . Allergic rhinitis   . Migraine    Past Surgical History  Procedure Laterality Date  . Partial hysterectomy    . Neck surgery    . Abdominal hysterectomy     Family History  Problem Relation Age of Onset  . Hypertension    . Dementia    . Arrhythmia Father     a fib  . Diabetes type II Father    Social History:  reports that she has never smoked. She does not have any smokeless tobacco history on file. She reports that she does not drink alcohol or use illicit drugs. 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 She drinks alcohol very rarely, once every 2-3 months.    Allergies:  Allergies  Allergen Reactions  . Oxaprozin Hives and Itching    "daypro"   Medications: I have reviewed the patient's current medications.  Results for orders placed during the hospital encounter of 09/19/13 (from the past 48 hour(s))  CBC WITH DIFFERENTIAL     Status: Abnormal   Collection Time    09/19/13 11:30 PM      Result Value Range   WBC 5.5  4.0 - 10.5 K/uL   RBC 4.25  3.87 - 5.11 MIL/uL   Hemoglobin 13.8  12.0 - 15.0 g/dL   HCT 16.1  09.6 - 04.5 %   MCV 94.6  78.0 - 100.0 fL   MCH 32.5  26.0 - 34.0 pg   MCHC 34.3  30.0 - 36.0 g/dL   RDW 40.9  81.1 - 91.4 %   Platelets 196  150 - 400 K/uL   Neutrophils Relative % 88 (*) 43 - 77 %   Neutro Abs 4.8  1.7 - 7.7 K/uL   Lymphocytes Relative 8 (*) 12 - 46 %   Lymphs Abs 0.4 (*) 0.7 - 4.0 K/uL   Monocytes Relative 3  3 - 12 %   Monocytes Absolute 0.1  0.1 - 1.0 K/uL   Eosinophils Relative 1  0 - 5 %   Eosinophils Absolute 0.1  0.0 - 0.7 K/uL   Basophils Relative 0  0 - 1 %  Basophils Absolute 0.0  0.0 - 0.1 K/uL  COMPREHENSIVE METABOLIC PANEL     Status: Abnormal   Collection Time    09/19/13 11:30 PM      Result Value Range   Sodium 141  135 - 145 mEq/L   Potassium 3.6  3.5 - 5.1 mEq/L   Chloride 102  96 - 112 mEq/L   CO2 26  19 - 32 mEq/L   Glucose, Bld 127 (*) 70 - 99 mg/dL   BUN 17  6 - 23 mg/dL   Creatinine, Ser 1.61  0.50 - 1.10 mg/dL   Calcium 9.5  8.4 - 09.6 mg/dL   Total Protein 6.9  6.0 - 8.3 g/dL   Albumin 4.3  3.5 - 5.2 g/dL   AST 045 (*) 0 - 37 U/L   ALT 228 (*) 0 - 35 U/L   Alkaline Phosphatase 91  39 - 117 U/L   Total Bilirubin 0.4  0.3 - 1.2 mg/dL   GFR calc non Af Amer >90  >90 mL/min   GFR calc Af Amer >90  >90 mL/min   Comment: (NOTE)     The eGFR has been calculated using the CKD EPI equation.     This calculation has not been validated in all clinical situations.     eGFR's persistently <90 mL/min signify possible Chronic Kidney     Disease.  LIPASE, BLOOD     Status:  Abnormal   Collection Time    09/19/13 11:30 PM      Result Value Range   Lipase 100 (*) 11 - 59 U/L  D-DIMER, QUANTITATIVE     Status: None   Collection Time    09/19/13 11:30 PM      Result Value Range   D-Dimer, Quant <0.27  0.00 - 0.48 ug/mL-FEU   Comment:            AT THE INHOUSE ESTABLISHED CUTOFF     VALUE OF 0.48 ug/mL FEU,     THIS ASSAY HAS BEEN DOCUMENTED     IN THE LITERATURE TO HAVE     A SENSITIVITY AND NEGATIVE     PREDICTIVE VALUE OF AT LEAST     98 TO 99%.  THE TEST RESULT     SHOULD BE CORRELATED WITH     AN ASSESSMENT OF THE CLINICAL     PROBABILITY OF DVT / VTE.  TROPONIN I     Status: None   Collection Time    09/19/13 11:30 PM      Result Value Range   Troponin I <0.30  <0.30 ng/mL   Comment:            Due to the release kinetics of cTnI,     a negative result within the first hours     of the onset of symptoms does not rule out     myocardial infarction with certainty.     If myocardial infarction is still suspected,     repeat the test at appropriate intervals.  CBC WITH DIFFERENTIAL     Status: Abnormal   Collection Time    09/20/13  7:05 AM      Result Value Range   WBC 3.3 (*) 4.0 - 10.5 K/uL   RBC 4.05  3.87 - 5.11 MIL/uL   Hemoglobin 13.2  12.0 - 15.0 g/dL   HCT 40.9  81.1 - 91.4 %   MCV 93.8  78.0 - 100.0 fL   MCH 32.6  26.0 -  34.0 pg   MCHC 34.7  30.0 - 36.0 g/dL   RDW 30.8  65.7 - 84.6 %   Platelets 193  150 - 400 K/uL   Neutrophils Relative % 83 (*) 43 - 77 %   Neutro Abs 2.7  1.7 - 7.7 K/uL   Lymphocytes Relative 10 (*) 12 - 46 %   Lymphs Abs 0.3 (*) 0.7 - 4.0 K/uL   Monocytes Relative 7  3 - 12 %   Monocytes Absolute 0.2  0.1 - 1.0 K/uL   Eosinophils Relative 0  0 - 5 %   Eosinophils Absolute 0.0  0.0 - 0.7 K/uL   Basophils Relative 0  0 - 1 %   Basophils Absolute 0.0  0.0 - 0.1 K/uL  LIPASE, BLOOD     Status: None   Collection Time    09/20/13  7:05 AM      Result Value Range   Lipase 33  11 - 59 U/L  HEPATIC FUNCTION  PANEL     Status: Abnormal   Collection Time    09/20/13  7:05 AM      Result Value Range   Total Protein 6.2  6.0 - 8.3 g/dL   Albumin 3.7  3.5 - 5.2 g/dL   AST 9629 (*) 0 - 37 U/L   ALT 851 (*) 0 - 35 U/L   Alkaline Phosphatase 106  39 - 117 U/L   Total Bilirubin 0.2 (*) 0.3 - 1.2 mg/dL   Bilirubin, Direct 0.2  0.0 - 0.3 mg/dL   Indirect Bilirubin 0.0 (*) 0.3 - 0.9 mg/dL  HEPATITIS PANEL, ACUTE     Status: None   Collection Time    09/20/13  7:05 AM      Result Value Range   Hepatitis B Surface Ag NEGATIVE  NEGATIVE   HCV Ab NEGATIVE  NEGATIVE   Hep A IgM NON REACTIVE  NON REACTIVE   Hep B C IgM NON REACTIVE  NON REACTIVE   Comment: (NOTE)     High levels of Hepatitis B Core IgM antibody are detectable     during the acute stage of Hepatitis B. This antibody is used     to differentiate current from past HBV infection.     Performed at [facility]  BASIC METABOLIC PANEL     Status: Abnormal   Collection Time    09/20/13  7:05 AM      Result Value Range   Sodium 141  135 - 145 mEq/L   Potassium 4.4  3.5 - 5.1 mEq/L   Chloride 106  96 - 112 mEq/L   CO2 28  19 - 32 mEq/L   Glucose, Bld 121 (*) 70 - 99 mg/dL   BUN 11  6 - 23 mg/dL   Creatinine, Ser 5.28  0.50 - 1.10 mg/dL   Calcium 8.4  8.4 - 41.3 mg/dL   GFR calc non Af Amer >90  >90 mL/min   GFR calc Af Amer >90  >90 mL/min   Comment: (NOTE)     The eGFR has been calculated using the CKD EPI equation.     This calculation has not been validated in all clinical situations.     eGFR's persistently <90 mL/min signify possible Chronic Kidney     Disease.  TROPONIN I     Status: None   Collection Time    09/20/13  7:05 AM      Result Value Range   Troponin I <0.30  <  0.30 ng/mL   Comment:            Due to the release kinetics of cTnI,     a negative result within the first hours     of the onset of symptoms does not rule out     myocardial infarction with certainty.     If myocardial infarction is still  suspected,     repeat the test at appropriate intervals.  TSH     Status: None   Collection Time    09/20/13  7:05 AM      Result Value Range   TSH 2.726  0.350 - 4.500 uIU/mL   Comment: Performed at [facility]  ACETAMINOPHEN LEVEL     Status: None   Collection Time    09/20/13  7:05 AM      Result Value Range   Acetaminophen (Tylenol), Serum <15.0  10 - 30 ug/mL   Comment:            THERAPEUTIC CONCENTRATIONS VARY     SIGNIFICANTLY. A RANGE OF 10-30     ug/mL MAY BE AN EFFECTIVE     CONCENTRATION FOR MANY PATIENTS.     HOWEVER, SOME ARE BEST TREATED     AT CONCENTRATIONS OUTSIDE THIS     RANGE.     ACETAMINOPHEN CONCENTRATIONS     >150 ug/mL AT 4 HOURS AFTER     INGESTION AND >50 ug/mL AT 12     HOURS AFTER INGESTION ARE     OFTEN ASSOCIATED WITH TOXIC     REACTIONS.  PROTIME-INR     Status: None   Collection Time    09/20/13  7:05 AM      Result Value Range   Prothrombin Time 14.0  11.6 - 15.2 seconds   INR 1.10  0.00 - 1.49  LIPID PANEL     Status: None   Collection Time    09/20/13  7:05 AM      Result Value Range   Cholesterol 154  0 - 200 mg/dL   Triglycerides 40  <784 mg/dL   HDL 70  >69 mg/dL   Total CHOL/HDL Ratio 2.2     VLDL 8  0 - 40 mg/dL   LDL Cholesterol 76  0 - 99 mg/dL   Comment:            Total Cholesterol/HDL:CHD Risk     Coronary Heart Disease Risk Table                         Men   Women      1/2 Average Risk   3.4   3.3      Average Risk       5.0   4.4      2 X Average Risk   9.6   7.1      3 X Average Risk  23.4   11.0                Use the calculated Patient Ratio     above and the CHD Risk Table     to determine the patient's CHD Risk.                ATP III CLASSIFICATION (LDL):      <100     mg/dL   Optimal      629-528  mg/dL   Near or Above  Optimal      130-159  mg/dL   Borderline      409-811  mg/dL   High      >914     mg/dL   Very High   Dg Chest 2 View  09/20/2013   CLINICAL DATA:   Chest and back pain.  EXAM: CHEST  2 VIEW  COMPARISON:  None  FINDINGS: Lower cervical spine fixation. Midline trachea. Normal heart size and mediastinal contours. No pleural effusion or pneumothorax. Diffuse peribronchial thickening. Clear lungs.  IMPRESSION: No acute cardiopulmonary disease. Interstitial thickening, possibly related to chronic bronchitis or asthma.   Electronically Signed   By: Jeronimo Greaves M.D.   On: 09/20/2013 01:09   Ct Angio Chest Pe W/cm &/or Wo Cm  09/20/2013   *RADIOLOGY REPORT*  Clinical Data:  Abdominal and chest pain  CT ANGIOGRAPHY CHEST ABDOMEN PELVIS  Technique:  Multidetector CT imaging of the chest, abdomen, and pelvis was performed using the standard protocol during bolus administration of intravenous contrast.  Multiplanar CT image reconstructions including MIPs were obtained to evaluate the vascular anatomy.  Contrast: OMNIPAQUE IOHEXOL 350 MG/ML SOLN  Comparison:   None.  CTA CHEST  Findings:  Normal caliber aorta.  No aneurysmal dilatation or dissection.  Pulmonary arterial branches are patent.  Normal heart size.  No pleural or pericardial effusion.  No intrathoracic lymphadenopathy.  Central airways are patent.  No pneumothorax.  Biapical scarring. A couple small fissural nodes.  Partially imaged cervical fusion hardware.  No acute osseous finding.   Review of the MIP images confirms the above findings.  IMPRESSION: No pulmonary embolism, aortic dissection, or acute intrathoracic process.  CTA ABDOMEN PELVIS  Findings:  Organ evaluation is suboptimal in the arterial phase. Within this limitation, low attenuation of the liver can be seen with fatty infiltration.  No radiodense gallstones.  No biliary ductal dilatation.  Unremarkable pancreas, spleen, adrenal glands. Symmetric renal enhancement.  No hydroureteronephrosis.  Mild colonic diverticulosis.  No CT evidence for colitis or diverticulitis.  Appendix normal.  Small bowel loops are normal course and caliber.   Tiny fat containing umbilical hernia.  No free intraperitoneal air or fluid.  No lymphadenopathy.  Normal caliber aorta and branch vessels.  Patent celiac axis, SMA, single renal arteries, and IMA.  Normal caliber common, internal, and external iliac arteries.  Normal caliber common femoral and proximal portions of the superficial femoral and profunda femoral arteries.  Thin-walled bladder.  Absent uterus.  No adnexal mass.  No acute osseous finding.   Review of the MIP images confirms the above findings.  IMPRESSION: Normal caliber abdominal aorta and branch vessels.  No acute abdominopelvic process identified by CT.   Original Report Authenticated By: Jearld Lesch, M.D.   US Abdomen Complete  09/20/2013   CLINICAL DATA:  Abdominal pain.  Elevated LFTs.  EXAM: ULTRASOUND ABDOMEN COMPLETE  COMPARISON:  CT of the chest 09/20/2013, CT of the abdomen and pelvis 06/20/2006  FINDINGS: Gallbladder:  Gallbladder has a normal appearance. Gallbladder wall is 1.7 mm, within normal limits. No stones or pericholecystic fluid. No sonographic Murphy's sign.  Common bile duct:  Diameter: 2.5 mm  Liver:  No focal lesion identified. Within normal limits in parenchymal echogenicity.  IVC:  No abnormality visualized.  Pancreas:  Visualized portion unremarkable.  Spleen:  Normal in size, 6.4 cm in length.  Right Kidney:  Length: 10.5 cm. Echogenicity within normal limits. No mass or hydronephrosis visualized.  Left Kidney:  Length: 10.6  cm. Echogenicity within normal limits. No mass or hydronephrosis visualized.  Abdominal aorta:  No aneurysm visualized.  Other findings:  None  IMPRESSION: 1. No evidence for acute cholecystitis. 2. Normal appearance of the upper abdomen.   Electronically Signed   By: Rosalie Gums M.D.   On: 09/20/2013 10:18   Ct Angio Abd/pel W/ And/or W/o  09/20/2013   *RADIOLOGY REPORT*  Clinical Data:  Abdominal and chest pain  CT ANGIOGRAPHY CHEST ABDOMEN PELVIS  Technique:  Multidetector CT imaging of the  chest, abdomen, and pelvis was performed using the standard protocol during bolus administration of intravenous contrast.  Multiplanar CT image reconstructions including MIPs were obtained to evaluate the vascular anatomy.  Contrast: OMNIPAQUE IOHEXOL 350 MG/ML SOLN  Comparison:   None.  CTA CHEST  Findings:  Normal caliber aorta.  No aneurysmal dilatation or dissection.  Pulmonary arterial branches are patent.  Normal heart size.  No pleural or pericardial effusion.  No intrathoracic lymphadenopathy.  Central airways are patent.  No pneumothorax.  Biapical scarring. A couple small fissural nodes.  Partially imaged cervical fusion hardware.  No acute osseous finding.   Review of the MIP images confirms the above findings.  IMPRESSION: No pulmonary embolism, aortic dissection, or acute intrathoracic process.  CTA ABDOMEN PELVIS  Findings:  Organ evaluation is suboptimal in the arterial phase. Within this limitation, low attenuation of the liver can be seen with fatty infiltration.  No radiodense gallstones.  No biliary ductal dilatation.  Unremarkable pancreas, spleen, adrenal glands. Symmetric renal enhancement.  No hydroureteronephrosis.  Mild colonic diverticulosis.  No CT evidence for colitis or diverticulitis.  Appendix normal.  Small bowel loops are normal course and caliber.  Tiny fat containing umbilical hernia.  No free intraperitoneal air or fluid.  No lymphadenopathy.  Normal caliber aorta and branch vessels.  Patent celiac axis, SMA, single renal arteries, and IMA.  Normal caliber common, internal, and external iliac arteries.  Normal caliber common femoral and proximal portions of the superficial femoral and profunda femoral arteries.  Thin-walled bladder.  Absent uterus.  No adnexal mass.  No acute osseous finding.   Review of the MIP images confirms the above findings.  IMPRESSION: Normal caliber abdominal aorta and branch vessels.  No acute abdominopelvic process identified by CT.   Original  Report Authenticated By: Jearld Lesch, M.D.   Review of Systems  Constitutional: Negative.   Eyes: Negative.   Respiratory: Negative.   Cardiovascular: Negative.   Gastrointestinal: Negative.   Genitourinary: Negative.   Musculoskeletal: Negative.   Skin: Negative.   Neurological: Positive for headaches.  Endo/Heme/Allergies: Negative.   Psychiatric/Behavioral: Negative.    Blood pressure 100/61, pulse 85, temperature 98 F (36.7 C), temperature source Oral, resp. rate 18, height 5\' 4"  (1.626 m), weight 55.9 kg (123 lb 3.8 oz), SpO2 98.00%. Physical Exam  Constitutional: She is oriented to person, place, and time. She appears well-developed and well-nourished.  HENT:  Head: Normocephalic and atraumatic.  Eyes: Conjunctivae and EOM are normal. Pupils are equal, round, and reactive to light.  Neck: Normal range of motion. Neck supple.  Cardiovascular: Normal rate and regular rhythm.   Respiratory: Effort normal and breath sounds normal.  GI: Soft. Bowel sounds are normal.  Musculoskeletal: Normal range of motion.  Neurological: She is alert and oriented to person, place, and time.  Skin: Skin is warm and dry.  Psychiatric: She has a normal mood and affect. Her behavior is normal. Judgment and thought content normal.  Assessment/Plan: 1) Abnormal LFT's with elevated AST/ALT and a normal bilirubin and alkaline phosphatase-fatty liver on ultrasound. I am not sure whether she has passed a stone but if her LFT's do not trend down tomorrow further workup will be needed. If the LFT's start to trend down tomorrow, she can be discharged tomorrow with plans to follow up with me on an OP basis. 2) Umbilical hernia on CT. 3) Colonic diverticulosis with diverticulitis. 4) Vasomotor rhinitis/maxillary sinusitus.  Anuel Sitter 09/20/2013, 8:11 PM

## 2013-09-20 NOTE — Progress Notes (Addendum)
[facility] PROGRESS NOTE Interim History: 52 y.o. female history of migraine started developing sudden onset of mid back pain last night around 9 PM which became more intense and patient started having epigastric pain which radiated the chest and was intense. Due to the intensity of the pain patient had CT angiogram of the chest and abdomen to rule out dissection. CAT scan was negative for any dissection or anything acute. Labs show negative cardiac markers but mildly elevated lipase and LFTs   Assessment/Plan: Elevated transaminase level/Abdominal pain - increase in LFT's with  A normal alk phos. Takes Fioricet (acetaminophen and barbiturate) and advil, pain is typical for acute pancreatitis, - [place] abd No evidence for acute cholecystitis.  2. Normal appearance of the upper abdomen - Ct abd: fatty liver no dilated duct or other path. - cardiac markers negative. - acute hepatitis panel negative. Cardiac markers negative. TSH 2.7. - Check HIV - consult GI.  Code Status: full Family Communication: none  Disposition Plan: inaptient   Consultants:  none  Procedures:  [place] abd  CT abd  Antibiotics:  none   HPI/Subjective: Mild abdominal pain with water  Objective: Filed Vitals:   09/20/13 0130 09/20/13 0348 09/20/13 0517 09/20/13 0830  BP: 130/64 96/64 95/65  92/68  Pulse: 91 78 63 69  Temp:   97.8 F (36.6 C) 98 F (36.7 C)  TempSrc:   Oral Oral  Resp: 16 18 18 18   Height:   5\' 4"  (1.626 m)   Weight:   55.9 kg (123 lb 3.8 oz)   SpO2: 100% 100% 99% 98%   No intake or output data in the 24 hours ending 09/20/13 1212 Filed Weights   09/20/13 0517  Weight: 55.9 kg (123 lb 3.8 oz)    Exam:  General: Alert, awake, oriented x3, in no acute distress.  HEENT: No bruits, no goiter.  Heart: Regular rate and rhythm, without murmurs, rubs, gallops.  Lungs: Good air movement, bilateral air movement.  Abdomen: Soft, epigastric tendernerss, nondistended, positive bowel  sounds.    Data Reviewed: Basic Metabolic Panel:  Recent Labs Lab 09/19/13 2330 09/20/13 0705  NA 141 141  K 3.6 4.4  CL 102 106  CO2 26 28  GLUCOSE 127* 121*  BUN 17 11  CREATININE 0.70 0.68  CALCIUM 9.5 8.4   Liver Function Tests:  Recent Labs Lab 09/19/13 2330 09/20/13 0705  AST 361* 1062*  ALT 228* 851*  ALKPHOS 91 106  BILITOT 0.4 0.2*  PROT 6.9 6.2  ALBUMIN 4.3 3.7    Recent Labs Lab 09/19/13 2330 09/20/13 0705  LIPASE 100* 33   No results found for this basename: AMMONIA,  in the last 168 hours CBC:  Recent Labs Lab 09/19/13 2330 09/20/13 0705  WBC 5.5 3.3*  NEUTROABS 4.8 2.7  HGB 13.8 13.2  HCT 40.2 38.0  MCV 94.6 93.8  PLT 196 193   Cardiac Enzymes:  Recent Labs Lab 09/19/13 2330 09/20/13 0705  TROPONINI <0.30 <0.30   BNP (last 3 results) No results found for this basename: PROBNP,  in the last 8760 hours CBG: No results found for this basename: GLUCAP,  in the last 168 hours  No results found for this or any previous visit (from the past 240 hour(s)).   Studies: Dg Chest 2 View  09/20/2013   CLINICAL DATA:  Chest and back pain.  EXAM: CHEST  2 VIEW  COMPARISON:  None  FINDINGS: Lower cervical spine fixation. Midline trachea. Normal heart size and mediastinal  contours. No pleural effusion or pneumothorax. Diffuse peribronchial thickening. Clear lungs.  IMPRESSION: No acute cardiopulmonary disease. Interstitial thickening, possibly related to chronic bronchitis or asthma.   Electronically Signed   By: Jeronimo Greaves M.D.   On: 09/20/2013 01:09   Ct Angio Chest Pe W/cm &/or Wo Cm  09/20/2013   *RADIOLOGY REPORT*  Clinical Data:  Abdominal and chest pain  CT ANGIOGRAPHY CHEST ABDOMEN PELVIS  Technique:  Multidetector CT imaging of the chest, abdomen, and pelvis was performed using the standard protocol during bolus administration of intravenous contrast.  Multiplanar CT image reconstructions including MIPs were obtained to evaluate the  vascular anatomy.  Contrast: OMNIPAQUE IOHEXOL 350 MG/ML SOLN  Comparison:   None.  CTA CHEST  Findings:  Normal caliber aorta.  No aneurysmal dilatation or dissection.  Pulmonary arterial branches are patent.  Normal heart size.  No pleural or pericardial effusion.  No intrathoracic lymphadenopathy.  Central airways are patent.  No pneumothorax.  Biapical scarring. A couple small fissural nodes.  Partially imaged cervical fusion hardware.  No acute osseous finding.   Review of the MIP images confirms the above findings.  IMPRESSION: No pulmonary embolism, aortic dissection, or acute intrathoracic process.  CTA ABDOMEN PELVIS  Findings:  Organ evaluation is suboptimal in the arterial phase. Within this limitation, low attenuation of the liver can be seen with fatty infiltration.  No radiodense gallstones.  No biliary ductal dilatation.  Unremarkable pancreas, spleen, adrenal glands. Symmetric renal enhancement.  No hydroureteronephrosis.  Mild colonic diverticulosis.  No CT evidence for colitis or diverticulitis.  Appendix normal.  Small bowel loops are normal course and caliber.  Tiny fat containing umbilical hernia.  No free intraperitoneal air or fluid.  No lymphadenopathy.  Normal caliber aorta and branch vessels.  Patent celiac axis, SMA, single renal arteries, and IMA.  Normal caliber common, internal, and external iliac arteries.  Normal caliber common femoral and proximal portions of the superficial femoral and profunda femoral arteries.  Thin-walled bladder.  Absent uterus.  No adnexal mass.  No acute osseous finding.   Review of the MIP images confirms the above findings.  IMPRESSION: Normal caliber abdominal aorta and branch vessels.  No acute abdominopelvic process identified by CT.   Original Report Authenticated By: Jearld Lesch, M.D.   US Abdomen Complete  09/20/2013   CLINICAL DATA:  Abdominal pain.  Elevated LFTs.  EXAM: ULTRASOUND ABDOMEN COMPLETE  COMPARISON:  CT of the chest  09/20/2013, CT of the abdomen and pelvis 06/20/2006  FINDINGS: Gallbladder:  Gallbladder has a normal appearance. Gallbladder wall is 1.7 mm, within normal limits. No stones or pericholecystic fluid. No sonographic Murphy's sign.  Common bile duct:  Diameter: 2.5 mm  Liver:  No focal lesion identified. Within normal limits in parenchymal echogenicity.  IVC:  No abnormality visualized.  Pancreas:  Visualized portion unremarkable.  Spleen:  Normal in size, 6.4 cm in length.  Right Kidney:  Length: 10.5 cm. Echogenicity within normal limits. No mass or hydronephrosis visualized.  Left Kidney:  Length: 10.6 cm. Echogenicity within normal limits. No mass or hydronephrosis visualized.  Abdominal aorta:  No aneurysm visualized.  Other findings:  None  IMPRESSION: 1. No evidence for acute cholecystitis. 2. Normal appearance of the upper abdomen.   Electronically Signed   By: Rosalie Gums M.D.   On: 09/20/2013 10:18   Ct Angio Abd/pel W/ And/or W/o  09/20/2013   *RADIOLOGY REPORT*  Clinical Data:  Abdominal and chest pain  CT ANGIOGRAPHY  CHEST ABDOMEN PELVIS  Technique:  Multidetector CT imaging of the chest, abdomen, and pelvis was performed using the standard protocol during bolus administration of intravenous contrast.  Multiplanar CT image reconstructions including MIPs were obtained to evaluate the vascular anatomy.  Contrast: OMNIPAQUE IOHEXOL 350 MG/ML SOLN  Comparison:   None.  CTA CHEST  Findings:  Normal caliber aorta.  No aneurysmal dilatation or dissection.  Pulmonary arterial branches are patent.  Normal heart size.  No pleural or pericardial effusion.  No intrathoracic lymphadenopathy.  Central airways are patent.  No pneumothorax.  Biapical scarring. A couple small fissural nodes.  Partially imaged cervical fusion hardware.  No acute osseous finding.   Review of the MIP images confirms the above findings.  IMPRESSION: No pulmonary embolism, aortic dissection, or acute intrathoracic process.  CTA  ABDOMEN PELVIS  Findings:  Organ evaluation is suboptimal in the arterial phase. Within this limitation, low attenuation of the liver can be seen with fatty infiltration.  No radiodense gallstones.  No biliary ductal dilatation.  Unremarkable pancreas, spleen, adrenal glands. Symmetric renal enhancement.  No hydroureteronephrosis.  Mild colonic diverticulosis.  No CT evidence for colitis or diverticulitis.  Appendix normal.  Small bowel loops are normal course and caliber.  Tiny fat containing umbilical hernia.  No free intraperitoneal air or fluid.  No lymphadenopathy.  Normal caliber aorta and branch vessels.  Patent celiac axis, SMA, single renal arteries, and IMA.  Normal caliber common, internal, and external iliac arteries.  Normal caliber common femoral and proximal portions of the superficial femoral and profunda femoral arteries.  Thin-walled bladder.  Absent uterus.  No adnexal mass.  No acute osseous finding.   Review of the MIP images confirms the above findings.  IMPRESSION: Normal caliber abdominal aorta and branch vessels.  No acute abdominopelvic process identified by CT.   Original Report Authenticated By: Jearld Lesch, M.D.    Scheduled Meds: . sodium chloride   Intravenous STAT  . pantoprazole (PROTONIX) IV  40 mg Intravenous Q24H  . sodium chloride  3 mL Intravenous Q12H   Continuous Infusions: . sodium chloride 100 mL/hr at 09/20/13 0600     FELIZ Rosine Beat  [facility] Pager [phone]. If 8PM-8AM, please contact night-coverage at [email or web address], password [facility] 09/20/2013, 12:12 PM  LOS: 1 day

## 2013-09-20 NOTE — H&P (Signed)
[facility] History and Physical  Mindy [name] ZOX:[id number] DOB: 1961/05/20 DOA: 09/19/2013  Referring physician: ER physician. PCP: No PCP Per Patient   Chief Complaint: Abdominal pain and chest pain.  HPI: Mindy [name] is a 52 y.o. female history of migraine started developing sudden onset of mid back pain last night around 9 PM which became more intense and patient started having epigastric pain which radiated the chest and was intense. Due to the intensity of the pain patient had CT angiogram of the chest and abdomen to rule out dissection. CAT scan was negative for any dissection or anything acute. Labs show negative cardiac markers but mildly elevated lipase and LFTs. Patient's pain was controlled with Dilaudid but has been recurring. Patient has been admitted for further management. Patient had one episode of nausea vomiting but denies any diarrhea.  Review of Systems: As presented in the history of presenting illness, rest negative.  Past Medical History  Diagnosis Date  . Allergic rhinitis   . Migraine    Past Surgical History  Procedure Laterality Date  . Partial hysterectomy    . Neck surgery    . Abdominal hysterectomy     Social History:  reports that she has never smoked. She does not have any smokeless tobacco history on file. She reports that she does not drink alcohol or use illicit drugs. Where does patient live home. Can patient participate in ADLs? Yes.  Allergies  Allergen Reactions  . Oxaprozin Hives and Itching    "daypro"    Family History:  Family History  Problem Relation Age of Onset  . Hypertension    . Dementia    . Arrhythmia Father     a fib  . Diabetes type II Father       Prior to Admission medications   Medication Sig Start Date End Date Taking? Authorizing Provider  Azelastine-Fluticasone (DYMISTA) 137-50 MCG/ACT SUSP Place 2 sprays into both nostrils at bedtime. 01/18/13   Waymon Budge, MD  BLACK COHOSH PO Take 1  capsule by mouth 2 (two) times daily.    Historical Provider, MD  butalbital-acetaminophen-caffeine (FIORICET) 50-325-40 MG per tablet Tale 1 tablet by mouth every 6 hours as needed 04/26/13   Waymon Budge, MD  calcium-vitamin D (OSCAL WITH D) 500-200 MG-UNIT per tablet Take 1 tablet by mouth 2 (two) times daily.      Historical Provider, MD  fexofenadine-pseudoephedrine (ALLEGRA-D) 60-120 MG per tablet Take 1 tablet by mouth 2 (two) times daily.    Historical Provider, MD  Naproxen Sodium (ALEVE) 220 MG CAPS Take by mouth. As needed per bottle instructions    Historical Provider, MD  ondansetron (ZOFRAN) 4 MG tablet Take 1 tablet (4 mg total) by mouth every 6 (six) hours. Prn n/v 03/24/13   Linna Hoff, MD  oxymetazoline (AFRIN) 0.05 % nasal spray Place 2 sprays into the nose at bedtime as needed for congestion.    Historical Provider, MD  pseudoephedrine (SUDAFED) 30 MG tablet Take 30 mg by mouth every 4 (four) hours as needed.    Historical Provider, MD  sodium chloride (OCEAN) 0.65 % nasal spray Place 1 spray into the nose as needed for congestion.    Historical Provider, MD    Physical Exam: Filed Vitals:   09/20/13 0045 09/20/13 0130 09/20/13 0348 09/20/13 0517  BP:  130/64 96/64 95/65   Pulse: 87 91 78 63  Temp:    97.8 F (36.6 C)  TempSrc:    Oral  Resp:  16 18 18   Height:    5\' 4"  (1.626 m)  Weight:    55.9 kg (123 lb 3.8 oz)  SpO2: 100% 100% 100% 99%     General:  Well-developed and nourished.  Eyes: Anicteric no pallor.  ENT: No discharge from ears eyes nose mouth.  Neck: No mass felt.  Cardiovascular: S1-S2 heard.  Respiratory: No rhonchi or crepitations.  Abdomen: Soft nontender bowel sounds present.  Skin: No rash.  Musculoskeletal: No edema.  Psychiatric: Appears normal.  Neurologic: Alert awake oriented to time place and person. Moves all extremities.  Labs on Admission:  Basic Metabolic Panel:  Recent Labs Lab 09/19/13 2330  NA 141  K 3.6  CL  102  CO2 26  GLUCOSE 127*  BUN 17  CREATININE 0.70  CALCIUM 9.5   Liver Function Tests:  Recent Labs Lab 09/19/13 2330  AST 361*  ALT 228*  ALKPHOS 91  BILITOT 0.4  PROT 6.9  ALBUMIN 4.3    Recent Labs Lab 09/19/13 2330  LIPASE 100*   No results found for this basename: AMMONIA,  in the last 168 hours CBC:  Recent Labs Lab 09/19/13 2330  WBC 5.5  NEUTROABS 4.8  HGB 13.8  HCT 40.2  MCV 94.6  PLT 196   Cardiac Enzymes:  Recent Labs Lab 09/19/13 2330  TROPONINI <0.30    BNP (last 3 results) No results found for this basename: PROBNP,  in the last 8760 hours CBG: No results found for this basename: GLUCAP,  in the last 168 hours  Radiological Exams on Admission: Dg Chest 2 View  09/20/2013   CLINICAL DATA:  Chest and back pain.  EXAM: CHEST  2 VIEW  COMPARISON:  None  FINDINGS: Lower cervical spine fixation. Midline trachea. Normal heart size and mediastinal contours. No pleural effusion or pneumothorax. Diffuse peribronchial thickening. Clear lungs.  IMPRESSION: No acute cardiopulmonary disease. Interstitial thickening, possibly related to chronic bronchitis or asthma.   Electronically Signed   By: Jeronimo Greaves M.D.   On: 09/20/2013 01:09   Ct Angio Chest Pe W/cm &/or Wo Cm  09/20/2013   *RADIOLOGY REPORT*  Clinical Data:  Abdominal and chest pain  CT ANGIOGRAPHY CHEST ABDOMEN PELVIS  Technique:  Multidetector CT imaging of the chest, abdomen, and pelvis was performed using the standard protocol during bolus administration of intravenous contrast.  Multiplanar CT image reconstructions including MIPs were obtained to evaluate the vascular anatomy.  Contrast: OMNIPAQUE IOHEXOL 350 MG/ML SOLN  Comparison:   None.  CTA CHEST  Findings:  Normal caliber aorta.  No aneurysmal dilatation or dissection.  Pulmonary arterial branches are patent.  Normal heart size.  No pleural or pericardial effusion.  No intrathoracic lymphadenopathy.  Central airways are patent.  No  pneumothorax.  Biapical scarring. A couple small fissural nodes.  Partially imaged cervical fusion hardware.  No acute osseous finding.   Review of the MIP images confirms the above findings.  IMPRESSION: No pulmonary embolism, aortic dissection, or acute intrathoracic process.  CTA ABDOMEN PELVIS  Findings:  Organ evaluation is suboptimal in the arterial phase. Within this limitation, low attenuation of the liver can be seen with fatty infiltration.  No radiodense gallstones.  No biliary ductal dilatation.  Unremarkable pancreas, spleen, adrenal glands. Symmetric renal enhancement.  No hydroureteronephrosis.  Mild colonic diverticulosis.  No CT evidence for colitis or diverticulitis.  Appendix normal.  Small bowel loops are normal course and caliber.  Tiny fat containing umbilical hernia.  No  free intraperitoneal air or fluid.  No lymphadenopathy.  Normal caliber aorta and branch vessels.  Patent celiac axis, SMA, single renal arteries, and IMA.  Normal caliber common, internal, and external iliac arteries.  Normal caliber common femoral and proximal portions of the superficial femoral and profunda femoral arteries.  Thin-walled bladder.  Absent uterus.  No adnexal mass.  No acute osseous finding.   Review of the MIP images confirms the above findings.  IMPRESSION: Normal caliber abdominal aorta and branch vessels.  No acute abdominopelvic process identified by CT.   Original Report Authenticated By: Jearld Lesch, M.D.   Ct Angio Abd/pel W/ And/or W/o  09/20/2013   *RADIOLOGY REPORT*  Clinical Data:  Abdominal and chest pain  CT ANGIOGRAPHY CHEST ABDOMEN PELVIS  Technique:  Multidetector CT imaging of the chest, abdomen, and pelvis was performed using the standard protocol during bolus administration of intravenous contrast.  Multiplanar CT image reconstructions including MIPs were obtained to evaluate the vascular anatomy.  Contrast: OMNIPAQUE IOHEXOL 350 MG/ML SOLN  Comparison:   None.  CTA CHEST   Findings:  Normal caliber aorta.  No aneurysmal dilatation or dissection.  Pulmonary arterial branches are patent.  Normal heart size.  No pleural or pericardial effusion.  No intrathoracic lymphadenopathy.  Central airways are patent.  No pneumothorax.  Biapical scarring. A couple small fissural nodes.  Partially imaged cervical fusion hardware.  No acute osseous finding.   Review of the MIP images confirms the above findings.  IMPRESSION: No pulmonary embolism, aortic dissection, or acute intrathoracic process.  CTA ABDOMEN PELVIS  Findings:  Organ evaluation is suboptimal in the arterial phase. Within this limitation, low attenuation of the liver can be seen with fatty infiltration.  No radiodense gallstones.  No biliary ductal dilatation.  Unremarkable pancreas, spleen, adrenal glands. Symmetric renal enhancement.  No hydroureteronephrosis.  Mild colonic diverticulosis.  No CT evidence for colitis or diverticulitis.  Appendix normal.  Small bowel loops are normal course and caliber.  Tiny fat containing umbilical hernia.  No free intraperitoneal air or fluid.  No lymphadenopathy.  Normal caliber aorta and branch vessels.  Patent celiac axis, SMA, single renal arteries, and IMA.  Normal caliber common, internal, and external iliac arteries.  Normal caliber common femoral and proximal portions of the superficial femoral and profunda femoral arteries.  Thin-walled bladder.  Absent uterus.  No adnexal mass.  No acute osseous finding.   Review of the MIP images confirms the above findings.  IMPRESSION: Normal caliber abdominal aorta and branch vessels.  No acute abdominopelvic process identified by CT.   Original Report Authenticated By: Jearld Lesch, M.D.    EKG: Independently reviewed. Sinus tachycardia.  Assessment/Plan Principal Problem:   Abdominal pain Active Problems:   Chest pain   Elevated transaminase level   Epigastric pain   1. Abdominal pain with elevated LFTs and lipase - patient could  have had mild pancreatitis. Could have passed a gallstone. Check repeat LFTs and lipase and check sonogram. Check a hepatitis panel and INR and Tylenol levels. And to check for causes of pancreatitis we will also check lipid panel. 2. History of migraine - denies any headache at this time.    Code Status: Full code.  Family Communication: None.  Disposition Plan: Admit to inpatient.    Sharisse Rantz N. [facility] Pager [phone].  If 7PM-7AM, please contact night-coverage [email or web address] Password [facility] 09/20/2013, 5:57 AM

## 2013-09-21 DIAGNOSIS — R109 Unspecified abdominal pain: Secondary | ICD-10-CM

## 2013-09-21 LAB — CBC
HCT: 35.7 % — ABNORMAL LOW (ref 36.0–46.0)
Hemoglobin: 12.4 g/dL (ref 12.0–15.0)
MCH: 33 pg (ref 26.0–34.0)
MCHC: 34.7 g/dL (ref 30.0–36.0)
MCV: 94.9 fL (ref 78.0–100.0)
Platelets: 175 10*3/uL (ref 150–400)
RBC: 3.76 MIL/uL — ABNORMAL LOW (ref 3.87–5.11)
RDW: 12.6 % (ref 11.5–15.5)
WBC: 3.2 10*3/uL — ABNORMAL LOW (ref 4.0–10.5)

## 2013-09-21 LAB — BASIC METABOLIC PANEL
BUN: 8 mg/dL (ref 6–23)
CO2: 27 mEq/L (ref 19–32)
Calcium: 8.3 mg/dL — ABNORMAL LOW (ref 8.4–10.5)
Chloride: 106 mEq/L (ref 96–112)
Creatinine, Ser: 0.75 mg/dL (ref 0.50–1.10)
GFR calc Af Amer: >90 mL/min (ref >90)
GFR calc non Af Amer: >90 mL/min (ref >90)
Glucose, Bld: 85 mg/dL (ref 70–99)
Potassium: 4 mEq/L (ref 3.5–5.1)
Sodium: 140 mEq/L (ref 135–145)

## 2013-09-21 LAB — HEPATIC FUNCTION PANEL
ALT: 462 U/L — ABNORMAL HIGH (ref 0–35)
AST: 259 U/L — ABNORMAL HIGH (ref 0–37)
Albumin: 3.2 g/dL — ABNORMAL LOW (ref 3.5–5.2)
Alkaline Phosphatase: 95 U/L (ref 39–117)
Bilirubin, Direct: <0.1 mg/dL (ref 0.0–0.3)
Total Bilirubin: 0.3 mg/dL (ref 0.3–1.2)
Total Protein: 5.6 g/dL — ABNORMAL LOW (ref 6.0–8.3)

## 2013-09-21 LAB — HIV ANTIBODY (ROUTINE TESTING W REFLEX)
HIV: NONREACTIVE
HIV: NONREACTIVE

## 2013-09-21 MED ORDER — PANTOPRAZOLE SODIUM 40 MG PO TBEC: 40.0000 mg | DELAYED_RELEASE_TABLET | Freq: Two times a day (BID) | ORAL | Status: DC

## 2013-09-21 NOTE — Discharge Summary (Signed)
Physician Discharge Summary  Mindy [name]:[id number] DOB: 1961-09-29 DOA: 09/19/2013  PCP: No PCP Per Patient  Admit date: 09/19/2013 Discharge date: 09/21/2013  Time spent: 35 minutes  Recommendations for Outpatient Follow-up:  1. Follow up with Dr. Loreta Ave in 2-4 weeks.  Discharge Diagnoses:  Principal Problem:   Abdominal pain Active Problems:   Chest pain   Elevated transaminase level   Epigastric pain   Discharge Condition: stable  Diet recommendation: soft diet  Filed Weights   09/20/13 0517 09/20/13 2047  Weight: 55.9 kg (123 lb 3.8 oz) 55.9 kg (123 lb 3.8 oz)    History of present illness:  52 y.o. female history of migraine started developing sudden onset of mid back pain last night around 9 PM which became more intense and patient started having epigastric pain which radiated the chest and was intense. Due to the intensity of the pain patient had CT angiogram of the chest and abdomen to rule out dissection. CAT scan was negative for any dissection or anything acute. Labs show negative cardiac markers but mildly elevated lipase and LFTs. Patient's pain was controlled with Dilaudid but has been recurring. Patient has been admitted for further management. Patient had one episode of nausea vomiting but denies any diarrhea.   Hospital Course:  Elevated transaminase level/Abdominal pain  - increase in LFT's with A normal alk phos. Takes Fioricet (acetaminophen and barbiturate) and advil, pain is typical for acute pancreatitis, - [place] abd No evidence for acute cholecystitis. 2. Normal appearance of the upper abdomen  - Ct abd: fatty liver no dilated duct or other path.  - cardiac markers negative.  - acute hepatitis panel negative. Cardiac markers negative. TSH 2.7.  - Negative HIV  - consult GI rec conservative manegement and follow up for EGD as an outpatinet.   Procedures:  CT abd  [place] abd  Consultations:  GI  Discharge Exam: Filed Vitals:   09/21/13 0755   BP: 115/78  Pulse: 95  Temp: 98.4 F (36.9 C)  Resp: 18    General: A&O x3 Cardiovascular: RRR Respiratory: good air movement CTA B/L  Discharge Instructions      Discharge Orders   Future Appointments Provider Department Dept Phone   12/27/2013 9:00 AM Waymon Budge, MD [facility] [phone]   Future Orders Complete By Expires   Diet - low sodium heart healthy  As directed    Increase activity slowly  As directed        Medication List    STOP taking these medications       naproxen sodium 220 MG tablet  Commonly known as:  ANAPROX     pseudoephedrine 60 MG tablet  Commonly known as:  SUDAFED      TAKE these medications       Azelastine-Fluticasone 137-50 MCG/ACT Susp  Commonly known as:  DYMISTA  Place 2 sprays into both nostrils at bedtime.     BLACK COHOSH PO  Take 1 capsule by mouth 2 (two) times daily.     butalbital-acetaminophen-caffeine 50-325-40 MG per tablet  Commonly known as:  FIORICET, ESGIC  Take 1 tablet by mouth every 6 (six) hours as needed for headache.     calcium-vitamin D 500-200 MG-UNIT per tablet  Commonly known as:  OSCAL WITH D  Take 1 tablet by mouth daily with breakfast.     oxymetazoline 0.05 % nasal spray  Commonly known as:  AFRIN  Place 2 sprays into the nose at bedtime as needed  for congestion.     pantoprazole 40 MG tablet  Commonly known as:  PROTONIX  Take 1 tablet (40 mg total) by mouth 2 (two) times daily.     sodium chloride 0.65 % nasal spray  Commonly known as:  OCEAN  Place 1 spray into the nose 4 (four) times daily as needed for congestion (and dryness).       Allergies  Allergen Reactions  . Oxaprozin Hives and Itching    "daypro"   Follow-up Information   Follow up with MANN,JYOTHI, MD In 2 weeks. (hospitla follow up)    Specialty:  Gastroenterology   Contact information:   [address] [place] [address] [phone]        The results of significant  diagnostics from this hospitalization (including imaging, microbiology, ancillary and laboratory) are listed below for reference.    Significant Diagnostic Studies: Dg Chest 2 View  09/20/2013   CLINICAL DATA:  Chest and back pain.  EXAM: CHEST  2 VIEW  COMPARISON:  None  FINDINGS: Lower cervical spine fixation. Midline trachea. Normal heart size and mediastinal contours. No pleural effusion or pneumothorax. Diffuse peribronchial thickening. Clear lungs.  IMPRESSION: No acute cardiopulmonary disease. Interstitial thickening, possibly related to chronic bronchitis or asthma.   Electronically Signed   By: Jeronimo Greaves M.D.   On: 09/20/2013 01:09   Ct Angio Chest Pe W/cm &/or Wo Cm  09/20/2013   *RADIOLOGY REPORT*  Clinical Data:  Abdominal and chest pain  CT ANGIOGRAPHY CHEST ABDOMEN PELVIS  Technique:  Multidetector CT imaging of the chest, abdomen, and pelvis was performed using the standard protocol during bolus administration of intravenous contrast.  Multiplanar CT image reconstructions including MIPs were obtained to evaluate the vascular anatomy.  Contrast: OMNIPAQUE IOHEXOL 350 MG/ML SOLN  Comparison:   None.  CTA CHEST  Findings:  Normal caliber aorta.  No aneurysmal dilatation or dissection.  Pulmonary arterial branches are patent.  Normal heart size.  No pleural or pericardial effusion.  No intrathoracic lymphadenopathy.  Central airways are patent.  No pneumothorax.  Biapical scarring. A couple small fissural nodes.  Partially imaged cervical fusion hardware.  No acute osseous finding.   Review of the MIP images confirms the above findings.  IMPRESSION: No pulmonary embolism, aortic dissection, or acute intrathoracic process.  CTA ABDOMEN PELVIS  Findings:  Organ evaluation is suboptimal in the arterial phase. Within this limitation, low attenuation of the liver can be seen with fatty infiltration.  No radiodense gallstones.  No biliary ductal dilatation.  Unremarkable pancreas, spleen,  adrenal glands. Symmetric renal enhancement.  No hydroureteronephrosis.  Mild colonic diverticulosis.  No CT evidence for colitis or diverticulitis.  Appendix normal.  Small bowel loops are normal course and caliber.  Tiny fat containing umbilical hernia.  No free intraperitoneal air or fluid.  No lymphadenopathy.  Normal caliber aorta and branch vessels.  Patent celiac axis, SMA, single renal arteries, and IMA.  Normal caliber common, internal, and external iliac arteries.  Normal caliber common femoral and proximal portions of the superficial femoral and profunda femoral arteries.  Thin-walled bladder.  Absent uterus.  No adnexal mass.  No acute osseous finding.   Review of the MIP images confirms the above findings.  IMPRESSION: Normal caliber abdominal aorta and branch vessels.  No acute abdominopelvic process identified by CT.   Original Report Authenticated By: Jearld Lesch, M.D.   US Abdomen Complete  09/20/2013   CLINICAL DATA:  Abdominal pain.  Elevated LFTs.  EXAM: ULTRASOUND ABDOMEN COMPLETE  COMPARISON:  CT of the chest 09/20/2013, CT of the abdomen and pelvis 06/20/2006  FINDINGS: Gallbladder:  Gallbladder has a normal appearance. Gallbladder wall is 1.7 mm, within normal limits. No stones or pericholecystic fluid. No sonographic Murphy's sign.  Common bile duct:  Diameter: 2.5 mm  Liver:  No focal lesion identified. Within normal limits in parenchymal echogenicity.  IVC:  No abnormality visualized.  Pancreas:  Visualized portion unremarkable.  Spleen:  Normal in size, 6.4 cm in length.  Right Kidney:  Length: 10.5 cm. Echogenicity within normal limits. No mass or hydronephrosis visualized.  Left Kidney:  Length: 10.6 cm. Echogenicity within normal limits. No mass or hydronephrosis visualized.  Abdominal aorta:  No aneurysm visualized.  Other findings:  None  IMPRESSION: 1. No evidence for acute cholecystitis. 2. Normal appearance of the upper abdomen.   Electronically Signed   By: Rosalie Gums  M.D.   On: 09/20/2013 10:18   Ct Angio Abd/pel W/ And/or W/o  09/20/2013   *RADIOLOGY REPORT*  Clinical Data:  Abdominal and chest pain  CT ANGIOGRAPHY CHEST ABDOMEN PELVIS  Technique:  Multidetector CT imaging of the chest, abdomen, and pelvis was performed using the standard protocol during bolus administration of intravenous contrast.  Multiplanar CT image reconstructions including MIPs were obtained to evaluate the vascular anatomy.  Contrast: OMNIPAQUE IOHEXOL 350 MG/ML SOLN  Comparison:   None.  CTA CHEST  Findings:  Normal caliber aorta.  No aneurysmal dilatation or dissection.  Pulmonary arterial branches are patent.  Normal heart size.  No pleural or pericardial effusion.  No intrathoracic lymphadenopathy.  Central airways are patent.  No pneumothorax.  Biapical scarring. A couple small fissural nodes.  Partially imaged cervical fusion hardware.  No acute osseous finding.   Review of the MIP images confirms the above findings.  IMPRESSION: No pulmonary embolism, aortic dissection, or acute intrathoracic process.  CTA ABDOMEN PELVIS  Findings:  Organ evaluation is suboptimal in the arterial phase. Within this limitation, low attenuation of the liver can be seen with fatty infiltration.  No radiodense gallstones.  No biliary ductal dilatation.  Unremarkable pancreas, spleen, adrenal glands. Symmetric renal enhancement.  No hydroureteronephrosis.  Mild colonic diverticulosis.  No CT evidence for colitis or diverticulitis.  Appendix normal.  Small bowel loops are normal course and caliber.  Tiny fat containing umbilical hernia.  No free intraperitoneal air or fluid.  No lymphadenopathy.  Normal caliber aorta and branch vessels.  Patent celiac axis, SMA, single renal arteries, and IMA.  Normal caliber common, internal, and external iliac arteries.  Normal caliber common femoral and proximal portions of the superficial femoral and profunda femoral arteries.  Thin-walled bladder.  Absent uterus.  No  adnexal mass.  No acute osseous finding.   Review of the MIP images confirms the above findings.  IMPRESSION: Normal caliber abdominal aorta and branch vessels.  No acute abdominopelvic process identified by CT.   Original Report Authenticated By: Jearld Lesch, M.D.    Microbiology: No results found for this or any previous visit (from the past 240 hour(s)).   Labs: Basic Metabolic Panel:  Recent Labs Lab 09/19/13 2330 09/20/13 0705 09/21/13 0544  NA 141 141 140  K 3.6 4.4 4.0  CL 102 106 106  CO2 26 28 27   GLUCOSE 127* 121* 85  BUN 17 11 8   CREATININE 0.70 0.68 0.75  CALCIUM 9.5 8.4 8.3*   Liver Function Tests:  Recent Labs Lab 09/19/13  2330 09/20/13 0705 09/21/13 0544  AST 361* 1062* 259*  ALT 228* 851* 462*  ALKPHOS 91 106 95  BILITOT 0.4 0.2* 0.3  PROT 6.9 6.2 5.6*  ALBUMIN 4.3 3.7 3.2*    Recent Labs Lab 09/19/13 2330 09/20/13 0705  LIPASE 100* 33   No results found for this basename: AMMONIA,  in the last 168 hours CBC:  Recent Labs Lab 09/19/13 2330 09/20/13 0705 09/21/13 0544  WBC 5.5 3.3* 3.2*  NEUTROABS 4.8 2.7  --   HGB 13.8 13.2 12.4  HCT 40.2 38.0 35.7*  MCV 94.6 93.8 94.9  PLT 196 193 175   Cardiac Enzymes:  Recent Labs Lab 09/19/13 2330 09/20/13 0705  TROPONINI <0.30 <0.30   BNP: BNP (last 3 results) No results found for this basename: PROBNP,  in the last 8760 hours CBG: No results found for this basename: GLUCAP,  in the last 168 hours     Signed:  Marinda Elk  [facility] 09/21/2013, 1:18 PM

## 2013-09-21 NOTE — Progress Notes (Signed)
Patient discharge teaching given, including activity, diet, follow-up appoints, and medications. Patient verbalized understanding of all discharge instructions. IV access was d/c'd. Vitals are stable. Skin is intact except as charted in most recent assessments. Pt to be escorted out by NT, to be driven home by family.  Kamarian Sahakian, MBA, BS, RN 

## 2013-09-21 NOTE — Progress Notes (Signed)
Came to visit patient on behalf of [facility] for [employer] employees/dependents with [facility] insurance. Reports she does not have any [facility] needs and reports she does not need follow up call. States she plans on calling herself to find and obtain appointment for PCP post hospital discharge. Declines this writer's assistance in trying to find her a PCP. Left brochure and contact information for her to call in the future if needed. Appreciative of visit.  Raiford Noble, MSN-Ed, RN,BSN- [facility] [phone]

## 2013-09-30 LAB — HEPATIC FUNCTION PANEL
ALT: 57 U/L — AB (ref 7–35)
AST: 19 U/L (ref 13–35)
Alkaline Phosphatase: 75 U/L (ref 25–125)
Bilirubin, Direct: 0.1 mg/dL (ref 0.01–0.4)
Bilirubin, Total: 0.3 mg/dL

## 2013-09-30 LAB — TSH: TSH: 2.2 u[IU]/mL (ref 0.41–5.90)

## 2013-10-04 ENCOUNTER — Other Ambulatory Visit (HOSPITAL_COMMUNITY): Payer: Self-pay | Admitting: Obstetrics and Gynecology

## 2013-10-04 DIAGNOSIS — Z1231 Encounter for screening mammogram for malignant neoplasm of breast: Secondary | ICD-10-CM

## 2013-10-08 ENCOUNTER — Ambulatory Visit: Payer: 59 | Admitting: Internal Medicine

## 2013-10-11 ENCOUNTER — Encounter: Payer: Self-pay | Admitting: Internal Medicine

## 2013-10-11 ENCOUNTER — Ambulatory Visit (INDEPENDENT_AMBULATORY_CARE_PROVIDER_SITE_OTHER): Payer: 59 | Admitting: Internal Medicine

## 2013-10-11 ENCOUNTER — Other Ambulatory Visit (INDEPENDENT_AMBULATORY_CARE_PROVIDER_SITE_OTHER): Payer: 59

## 2013-10-11 VITALS — BP 136/92 | HR 65 | Temp 99.1°F | Ht 64.0 in | Wt 123.8 lb

## 2013-10-11 DIAGNOSIS — R030 Elevated blood-pressure reading, without diagnosis of hypertension: Secondary | ICD-10-CM

## 2013-10-11 DIAGNOSIS — R51 Headache: Secondary | ICD-10-CM

## 2013-10-11 DIAGNOSIS — R109 Unspecified abdominal pain: Secondary | ICD-10-CM

## 2013-10-11 DIAGNOSIS — R519 Headache, unspecified: Secondary | ICD-10-CM

## 2013-10-11 DIAGNOSIS — IMO0001 Reserved for inherently not codable concepts without codable children: Secondary | ICD-10-CM

## 2013-10-11 DIAGNOSIS — R1013 Epigastric pain: Secondary | ICD-10-CM

## 2013-10-11 DIAGNOSIS — R7401 Elevation of levels of liver transaminase levels: Secondary | ICD-10-CM

## 2013-10-11 DIAGNOSIS — J309 Allergic rhinitis, unspecified: Secondary | ICD-10-CM

## 2013-10-11 LAB — CBC WITH DIFFERENTIAL/PLATELET
Basophils Absolute: 0 10*3/uL (ref 0.0–0.1)
Basophils Relative: 0.4 % (ref 0.0–3.0)
Eosinophils Absolute: 0.1 10*3/uL (ref 0.0–0.7)
Eosinophils Relative: 1.9 % (ref 0.0–5.0)
HCT: 39.2 % (ref 36.0–46.0)
Hemoglobin: 13.3 g/dL (ref 12.0–15.0)
Lymphocytes Relative: 28 % (ref 12.0–46.0)
Lymphs Abs: 1.3 10*3/uL (ref 0.7–4.0)
MCHC: 33.8 g/dL (ref 30.0–36.0)
MCV: 94.5 fl (ref 78.0–100.0)
Monocytes Absolute: 0.5 10*3/uL (ref 0.1–1.0)
Monocytes Relative: 11.6 % (ref 3.0–12.0)
Neutro Abs: 2.6 10*3/uL (ref 1.4–7.7)
Neutrophils Relative %: 58.1 % (ref 43.0–77.0)
Platelets: 265 10*3/uL (ref 150.0–400.0)
RBC: 4.15 Mil/uL (ref 3.87–5.11)
RDW: 12.5 % (ref 11.5–14.6)
WBC: 4.6 10*3/uL (ref 4.5–10.5)

## 2013-10-11 LAB — URINALYSIS, ROUTINE W REFLEX MICROSCOPIC
Bilirubin Urine: NEGATIVE
Hgb urine dipstick: NEGATIVE
Ketones, ur: NEGATIVE
Nitrite: NEGATIVE
Specific Gravity, Urine: 1.01 (ref 1.000–1.030)
Total Protein, Urine: NEGATIVE
Urine Glucose: NEGATIVE
Urobilinogen, UA: 0.2 (ref 0.0–1.0)
pH: 7.5 (ref 5.0–8.0)

## 2013-10-11 MED ORDER — PANTOPRAZOLE SODIUM 40 MG PO TBEC: 40.0000 mg | DELAYED_RELEASE_TABLET | Freq: Two times a day (BID) | ORAL | Status: DC

## 2013-10-11 MED ORDER — TRAMADOL HCL 50 MG PO TABS
50.0000 mg | ORAL_TABLET | Freq: Three times a day (TID) | ORAL | Status: DC | PRN
Start: 2013-10-11 — End: 2014-08-01

## 2013-10-11 NOTE — Progress Notes (Signed)
Subjective:    Patient ID: Mindy [name], female    DOB: 11-13-1960, 52 y.o.   MRN: [id number]  HPI New patient to me, here to establish care and for hospital followup  complains of epigastric pain - ?liver or GB Ongoing >3 weeks, currently improved symptoms but not resolved. Hospitalized 09/20/2013 for back pain radiating to epigastric pain and findings of acute transient LFT elevation during hospitalization Seen by GI as well as hospitalist service: no evidence for PE, negative CT angiogram for abdominal aortic aneurysm, abdominal ultrasound negative for gallbladder or liver anatomic problem. Labs negative for acute hepatitis panel and negative for HIV Since home, mild persisting epigastric pain, but hasimproved with twice-daily PPI,  Describes new bilateral lower back and flank pain, polyuria with increased frequency, and new frontal headache for 2 weeks associated with elevated blood pressure.  "I just don't feel right"  Also reviewed chronic medical issues:  Allergic rhinitis with chronic sinusitis. Follows with allergy Dr. Maple Hudson and ENT Dr. Annalee Genta for same. Takes Astelin with nasal steroid daily. Reports increase sinus pressure over right maxillary region -takes over-the-counter Afrin  Chronic headaches. History of migraines but increased frequency and intensity of frontal symptoms in past 2 weeks. Describes current symptoms as a "vice" and "band" around frontal and bilateral temporal region. Migraines generally abate with Fioricet but have been minimally responsive to current symptoms. Not associated with weakness, vision change, seizure, falls.  Hot flashes/postmenopausal syndrome. His with gynecology on same. Temporarily on estrogen patch early 2014, discontinued because of headache symptoms. Was taking herbal supplement for hot flash symptoms, but discontinued following acute hospitalization for abdominal pain and LFT changes  Past Medical History  Diagnosis Date  . Allergic  rhinitis   . Migraine   . Elevated transaminase level     ?etiology - present 09/2013 hosp for epigastric pain   Family History  Problem Relation Age of Onset  . Hypertension Mother   . Dementia Mother   . Atrial fibrillation Father   . Diabetes type II Father   . Hypertension Father   . Hypertension Brother   . Coronary artery disease Father 51    stent x 2, AoVR   History  Substance Use Topics  . Smoking status: Never Smoker   . Smokeless tobacco: Not on file  . Alcohol Use: No     Review of Systems  Constitutional: Positive for fever and fatigue. Negative for unexpected weight change.  Respiratory: Negative for cough, shortness of breath and wheezing.   Cardiovascular: Negative for chest pain, palpitations and leg swelling.  Gastrointestinal: Positive for abdominal pain. Negative for nausea, vomiting, diarrhea, constipation and abdominal distention.  Genitourinary: Positive for frequency and flank pain. Negative for hematuria, decreased urine volume and pelvic pain.  Musculoskeletal: Negative for back pain, gait problem and joint swelling.  Skin: Negative for color change.  Neurological: Positive for headaches. Negative for dizziness, seizures, syncope, facial asymmetry, speech difficulty, weakness, light-headedness and numbness.  Hematological: Bruises/bleeds easily.  Psychiatric/Behavioral: Negative for dysphoric mood. The patient is not nervous/anxious.   All other systems reviewed and are negative.       Objective:   Physical Exam BP 136/92  Pulse 65  Temp(Src) 99.1 F (37.3 C) (Oral)  Ht 5\' 4"  (1.626 m)  Wt 123 lb 12.8 oz (56.155 kg)  BMI 21.24 kg/m2  SpO2 98% Wt Readings from Last 3 Encounters:  10/11/13 123 lb 12.8 oz (56.155 kg)  09/20/13 123 lb 3.8 oz (55.9 kg)  09/13/13  120 lb (54.432 kg)   Constitutional: She appears well-developed and well-nourished. No distress.  HENT: Head: Normocephalic and atraumatic. Ears: B TMs ok, no erythema or effusion;  Nose: Nose normal. Mouth/Throat: Oropharynx is clear and moist. No oropharyngeal exudate.  Eyes: Conjunctivae and EOM are normal. Pupils are equal, round, and reactive to light. No scleral icterus.  Neck: Normal range of motion. Neck supple. No JVD present. No thyromegaly present.  Cardiovascular: Normal rate, regular rhythm and normal heart sounds.  No murmur heard. No BLE edema. Pulmonary/Chest: Effort normal and breath sounds normal. No respiratory distress. She has no wheezes.  Abdominal: Soft. Bowel sounds are normal. She exhibits no distension. There is mild epigastric tenderness below the xiphoid process. no masses. No appreciable HSM. No rebound or guarding Musculoskeletal: Normal range of motion, no joint effusions. No gross deformities. Back: full range of motion of thoracic and lumbar spine. Non tender to palpation. Negative straight leg raise. DTR's are symmetrically intact. Sensation intact in all dermatomes of the lower extremities. Full strength to manual muscle testing. patient is able to heel toe walk without difficulty and ambulates with normal gait. Neurological: She is alert and oriented to person, place, and time. No cranial nerve deficit. Coordination, balance, strength, speech and gait are normal.  Skin: Skin is warm and dry. No rash noted. No erythema.  Psychiatric: She has a normal mood and affect. Her behavior is normal. Judgment and thought content normal.   Lab Results  Component Value Date   WBC 3.2* 09/21/2013   HGB 12.4 09/21/2013   HCT 35.7* 09/21/2013   PLT 175 09/21/2013   GLUCOSE 85 09/21/2013   CHOL 154 09/20/2013   TRIG 40 09/20/2013   HDL 70 09/20/2013   LDLCALC 76 09/20/2013   ALT 57* 09/30/2013   AST 19 09/30/2013   NA 140 09/21/2013   K 4.0 09/21/2013   CL 106 09/21/2013   CREATININE 0.75 09/21/2013   BUN 8 09/21/2013   CO2 27 09/21/2013   TSH 2.20 09/30/2013   INR 1.10 09/20/2013   Lab Results  Component Value Date   LIPASE 33 09/20/2013   Dg Chest 2  View  09/20/2013   CLINICAL DATA:  Chest and back pain.  EXAM: CHEST  2 VIEW  COMPARISON:  None  FINDINGS: Lower cervical spine fixation. Midline trachea. Normal heart size and mediastinal contours. No pleural effusion or pneumothorax. Diffuse peribronchial thickening. Clear lungs.  IMPRESSION: No acute cardiopulmonary disease. Interstitial thickening, possibly related to chronic bronchitis or asthma.   Electronically Signed   By: Jeronimo Greaves M.D.   On: 09/20/2013 01:09   Ct Angio Chest Pe W/cm &/or Wo Cm  09/20/2013   *RADIOLOGY REPORT*  Clinical Data:  Abdominal and chest pain  CT ANGIOGRAPHY CHEST ABDOMEN PELVIS  Technique:  Multidetector CT imaging of the chest, abdomen, and pelvis was performed using the standard protocol during bolus administration of intravenous contrast.  Multiplanar CT image reconstructions including MIPs were obtained to evaluate the vascular anatomy.  Contrast: OMNIPAQUE IOHEXOL 350 MG/ML SOLN  Comparison:   None.  CTA CHEST  Findings:  Normal caliber aorta.  No aneurysmal dilatation or dissection.  Pulmonary arterial branches are patent.  Normal heart size.  No pleural or pericardial effusion.  No intrathoracic lymphadenopathy.  Central airways are patent.  No pneumothorax.  Biapical scarring. A couple small fissural nodes.  Partially imaged cervical fusion hardware.  No acute osseous finding.   Review of the MIP images confirms the above  findings.  IMPRESSION: No pulmonary embolism, aortic dissection, or acute intrathoracic process.  CTA ABDOMEN PELVIS  Findings:  Organ evaluation is suboptimal in the arterial phase. Within this limitation, low attenuation of the liver can be seen with fatty infiltration.  No radiodense gallstones.  No biliary ductal dilatation.  Unremarkable pancreas, spleen, adrenal glands. Symmetric renal enhancement.  No hydroureteronephrosis.  Mild colonic diverticulosis.  No CT evidence for colitis or diverticulitis.  Appendix normal.  Small bowel loops  are normal course and caliber.  Tiny fat containing umbilical hernia.  No free intraperitoneal air or fluid.  No lymphadenopathy.  Normal caliber aorta and branch vessels.  Patent celiac axis, SMA, single renal arteries, and IMA.  Normal caliber common, internal, and external iliac arteries.  Normal caliber common femoral and proximal portions of the superficial femoral and profunda femoral arteries.  Thin-walled bladder.  Absent uterus.  No adnexal mass.  No acute osseous finding.   Review of the MIP images confirms the above findings.  IMPRESSION: Normal caliber abdominal aorta and branch vessels.  No acute abdominopelvic process identified by CT.   Original Report Authenticated By: Jearld Lesch, M.D.   US Abdomen Complete  09/20/2013   CLINICAL DATA:  Abdominal pain.  Elevated LFTs.  EXAM: ULTRASOUND ABDOMEN COMPLETE  COMPARISON:  CT of the chest 09/20/2013, CT of the abdomen and pelvis 06/20/2006  FINDINGS: Gallbladder:  Gallbladder has a normal appearance. Gallbladder wall is 1.7 mm, within normal limits. No stones or pericholecystic fluid. No sonographic Murphy's sign.  Common bile duct:  Diameter: 2.5 mm  Liver:  No focal lesion identified. Within normal limits in parenchymal echogenicity.  IVC:  No abnormality visualized.  Pancreas:  Visualized portion unremarkable.  Spleen:  Normal in size, 6.4 cm in length.  Right Kidney:  Length: 10.5 cm. Echogenicity within normal limits. No mass or hydronephrosis visualized.  Left Kidney:  Length: 10.6 cm. Echogenicity within normal limits. No mass or hydronephrosis visualized.  Abdominal aorta:  No aneurysm visualized.  Other findings:  None  IMPRESSION: 1. No evidence for acute cholecystitis. 2. Normal appearance of the upper abdomen.   Electronically Signed   By: Rosalie Gums M.D.   On: 09/20/2013 10:18   Ct Angio Abd/pel W/ And/or W/o  09/20/2013   *RADIOLOGY REPORT*  Clinical Data:  Abdominal and chest pain  CT ANGIOGRAPHY CHEST ABDOMEN PELVIS  Technique:   Multidetector CT imaging of the chest, abdomen, and pelvis was performed using the standard protocol during bolus administration of intravenous contrast.  Multiplanar CT image reconstructions including MIPs were obtained to evaluate the vascular anatomy.  Contrast: OMNIPAQUE IOHEXOL 350 MG/ML SOLN  Comparison:   None.  CTA CHEST  Findings:  Normal caliber aorta.  No aneurysmal dilatation or dissection.  Pulmonary arterial branches are patent.  Normal heart size.  No pleural or pericardial effusion.  No intrathoracic lymphadenopathy.  Central airways are patent.  No pneumothorax.  Biapical scarring. A couple small fissural nodes.  Partially imaged cervical fusion hardware.  No acute osseous finding.   Review of the MIP images confirms the above findings.  IMPRESSION: No pulmonary embolism, aortic dissection, or acute intrathoracic process.  CTA ABDOMEN PELVIS  Findings:  Organ evaluation is suboptimal in the arterial phase. Within this limitation, low attenuation of the liver can be seen with fatty infiltration.  No radiodense gallstones.  No biliary ductal dilatation.  Unremarkable pancreas, spleen, adrenal glands. Symmetric renal enhancement.  No hydroureteronephrosis.  Mild colonic diverticulosis.  No CT  evidence for colitis or diverticulitis.  Appendix normal.  Small bowel loops are normal course and caliber.  Tiny fat containing umbilical hernia.  No free intraperitoneal air or fluid.  No lymphadenopathy.  Normal caliber aorta and branch vessels.  Patent celiac axis, SMA, single renal arteries, and IMA.  Normal caliber common, internal, and external iliac arteries.  Normal caliber common femoral and proximal portions of the superficial femoral and profunda femoral arteries.  Thin-walled bladder.  Absent uterus.  No adnexal mass.  No acute osseous finding.   Review of the MIP images confirms the above findings.  IMPRESSION: Normal caliber abdominal aorta and branch vessels.  No acute abdominopelvic process  identified by CT.   Original Report Authenticated By: Jearld Lesch, M.D.     Assessment & Plan:   See problem list. Medications and labs reviewed today.  Time spent with pt today 45 minutes, greater than 50% time spent counseling patient on recent hospitalization 09/20/2013 for increased LFTs in the setting of back and epigastric pain, bilateral flank pain, new headache (different from chronic migraines) and medication review. Also review of prior records brought from outside EMR (GI physician)

## 2013-10-11 NOTE — Assessment & Plan Note (Signed)
Neuro exam benign History most consistent with tension headache, possible exacerbation by analgesic rebound avoiding NSAIDs and Tylenol since discharge History of migraines, recurrent symptoms unresponsive to Fioricet Will check MRI brain as this has not been done in many years Continue treatment of chronic sinusitis and allergic rhinitis, see next Tramadol to use as needed for pain management pending further evaluation of symptoms

## 2013-10-11 NOTE — Assessment & Plan Note (Signed)
[facility] LFTs December 1 with AST 1062 and ALT 857 Hospital evaluation including GI consultation reviewed: normal abdominal ultrasound, negative acute hepatitis panel Since discharge, followup with GI has shown normalization of LFTs Refer for HIDA, ?biliary dyskinesia Check labs to evaluate autoimmune hepatitis or other etiology As symptoms improved with PPI twice daily, continue same, refills provided Consider evaluation by hepatologist if recurrent symptoms/problems and no other etiology identified Patient will avoid acetaminophen and other potentially hepatotoxic meds

## 2013-10-11 NOTE — Assessment & Plan Note (Signed)
Chronic symptoms with seasonal flare Follows with allergist Dr. Maple Hudson and previously ENT Dr. Annalee Genta for same On Dymista nasal steroids with topical antihistamine in place of prior Nasonex/Flonase. Advised against habitual use of Afrin Patient will minimize Sudafed use given tendency towards elevated blood pressure readings Continue saline irrigation as ongoing

## 2013-10-11 NOTE — Assessment & Plan Note (Signed)
December 1 hospitalization for intense back pain radiating to epigastric pain reviewed Negative abdominal ultrasound, CT angio and laboratory evaluation: no evidence for PE, aortic aneurysm, acute MI, mass, pancreatitis, acute cholecystitis ?Gastritis. Continue PPI and followup with GI as planned Avoiding NSAIDs - will use tramadol as needed in its place

## 2013-10-11 NOTE — Progress Notes (Signed)
Pre-visit discussion using our clinic review tool. No additional management support is needed unless otherwise documented below in the visit note.  

## 2013-10-11 NOTE — Patient Instructions (Signed)
It was good to see you today.  We have reviewed your prior records including labs and tests today  Test(s) ordered today. Your results will be released to MyChart (or called to you) after review, usually within 72hours after test completion. If any changes need to be made, you will be notified at that same time.  Medications reviewed and updated, use tramadol as needed for pain- no other changes recommended at this time.  Your prescription(s) have been submitted to your pharmacy. Please take as directed and contact our office if you believe you are having problem(s) with the medication(s).  we'll make referral for MRI brain to evaluate headache and for HIDA to evaluate GB health. Our office will contact you regarding appointment(s) once made.  Please schedule followup in 3-4 months, call sooner if problems.

## 2013-10-12 ENCOUNTER — Encounter: Payer: Self-pay | Admitting: Internal Medicine

## 2013-10-12 LAB — BASIC METABOLIC PANEL
BUN: 13 mg/dL (ref 6–23)
CO2: 31 mEq/L (ref 19–32)
Calcium: 9 mg/dL (ref 8.4–10.5)
Chloride: 103 mEq/L (ref 96–112)
Creatinine, Ser: 1.3 mg/dL — ABNORMAL HIGH (ref 0.4–1.2)
GFR: 46.01 mL/min — ABNORMAL LOW (ref >60.00)
Glucose, Bld: 86 mg/dL (ref 70–99)
Potassium: 4.1 mEq/L (ref 3.5–5.1)
Sodium: 140 mEq/L (ref 135–145)

## 2013-10-12 LAB — HEPATIC FUNCTION PANEL
ALT: 32 U/L (ref 0–35)
AST: 21 U/L (ref 0–37)
Albumin: 4.2 g/dL (ref 3.5–5.2)
Alkaline Phosphatase: 79 U/L (ref 39–117)
Bilirubin, Direct: 0.1 mg/dL (ref 0.0–0.3)
Total Bilirubin: 0.3 mg/dL (ref 0.3–1.2)
Total Protein: 7 g/dL (ref 6.0–8.3)

## 2013-10-12 LAB — ANA: Anti Nuclear Antibody(ANA): NEGATIVE

## 2013-10-12 LAB — EXTRACTABLE NUCLEAR ANTIGEN ANTIBODY
ENA SM Ab Ser-aCnc: 1 AU/mL (ref <30)
SM/RNP: 1 AU/mL (ref <30)
SSA (Ro) (ENA) Antibody, IgG: 18 AU/mL (ref <30)
SSB (La) (ENA) Antibody, IgG: 1 AU/mL (ref <30)
Scleroderma (Scl-70) (ENA) Antibody, IgG: <1 AU/mL (ref <30)
ds DNA Ab: <1 IU/mL (ref <30)

## 2013-10-12 LAB — ACTIN (SMOOTH MUSCLE) ANTIBODY: Smooth Muscle Ab: 7 Units (ref 0–19)

## 2013-10-12 LAB — FERRITIN: Ferritin: 12.6 ng/mL (ref 10.0–291.0)

## 2013-10-18 MED ORDER — AMOXICILLIN-POT CLAVULANATE 875-125 MG PO TABS: 1.0000 | ORAL_TABLET | Freq: Two times a day (BID) | ORAL | Status: AC

## 2013-10-22 ENCOUNTER — Ambulatory Visit (HOSPITAL_COMMUNITY): Payer: 59

## 2013-10-27 ENCOUNTER — Ambulatory Visit (HOSPITAL_COMMUNITY)
Admission: RE | Admit: 2013-10-27 | Discharge: 2013-10-27 | Disposition: A | Discharge status: Home / Self Care | Payer: 59 | Source: Ambulatory Visit | Attending: Internal Medicine | Admitting: Internal Medicine

## 2013-10-27 DIAGNOSIS — R109 Unspecified abdominal pain: Secondary | ICD-10-CM | POA: Insufficient documentation

## 2013-10-27 DIAGNOSIS — R110 Nausea: Secondary | ICD-10-CM | POA: Insufficient documentation

## 2013-10-27 MED ORDER — TECHNETIUM TC 99M MEBROFENIN IV KIT
5.0000 | PACK | Freq: Once | INTRAVENOUS | Status: AC | PRN
  Administered 2013-10-27: 5 via INTRAVENOUS

## 2013-10-28 ENCOUNTER — Ambulatory Visit (HOSPITAL_COMMUNITY)
Admission: RE | Admit: 2013-10-28 | Discharge: 2013-10-28 | Disposition: A | Discharge status: Home / Self Care | Payer: 59 | Source: Ambulatory Visit | Attending: Internal Medicine | Admitting: Internal Medicine

## 2013-10-28 ENCOUNTER — Encounter: Payer: Self-pay | Admitting: Internal Medicine

## 2013-10-28 DIAGNOSIS — Z1231 Encounter for screening mammogram for malignant neoplasm of breast: Secondary | ICD-10-CM

## 2013-10-28 DIAGNOSIS — R109 Unspecified abdominal pain: Secondary | ICD-10-CM | POA: Insufficient documentation

## 2013-10-28 DIAGNOSIS — R7989 Other specified abnormal findings of blood chemistry: Secondary | ICD-10-CM

## 2013-10-28 DIAGNOSIS — R110 Nausea: Secondary | ICD-10-CM | POA: Insufficient documentation

## 2013-11-02 ENCOUNTER — Other Ambulatory Visit: Payer: Self-pay | Admitting: Obstetrics and Gynecology

## 2013-11-02 DIAGNOSIS — R928 Other abnormal and inconclusive findings on diagnostic imaging of breast: Secondary | ICD-10-CM

## 2013-11-04 ENCOUNTER — Encounter (HOSPITAL_COMMUNITY): Payer: 59

## 2013-11-08 ENCOUNTER — Ambulatory Visit
Admission: RE | Admit: 2013-11-08 | Discharge: 2013-11-08 | Disposition: A | Discharge status: Home / Self Care | Payer: 59 | Source: Ambulatory Visit | Attending: Obstetrics and Gynecology | Admitting: Obstetrics and Gynecology

## 2013-11-08 DIAGNOSIS — R928 Other abnormal and inconclusive findings on diagnostic imaging of breast: Secondary | ICD-10-CM

## 2013-12-19 HISTORY — PX: COLONOSCOPY: SHX174

## 2013-12-27 ENCOUNTER — Encounter: Payer: Self-pay | Admitting: Internal Medicine

## 2013-12-27 ENCOUNTER — Ambulatory Visit (INDEPENDENT_AMBULATORY_CARE_PROVIDER_SITE_OTHER): Payer: 59 | Admitting: Internal Medicine

## 2013-12-27 ENCOUNTER — Other Ambulatory Visit: Payer: 59

## 2013-12-27 VITALS — BP 116/72 | HR 86 | Ht 64.0 in | Wt 123.6 lb

## 2013-12-27 DIAGNOSIS — J300 Vasomotor rhinitis: Secondary | ICD-10-CM

## 2013-12-27 DIAGNOSIS — J309 Allergic rhinitis, unspecified: Secondary | ICD-10-CM

## 2013-12-27 MED ORDER — ACRIVASTINE-PSEUDOEPHEDRINE 8-60 MG PO CAPS: ORAL_CAPSULE | ORAL | Status: DC
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
## 2013-12-27 NOTE — Patient Instructions (Signed)
Order- lab- Allergy profile   Dx allergic rhinitis  Script to try antihistamine decongestant Semprex-D

## 2013-12-28 LAB — ALLERGY FULL PROFILE
Allergen, D pternoyssinus,d7: <0.1 kU/L
Allergen,Goose feathers, e70: <0.1 kU/L
Alternaria Alternata: <0.1 kU/L
Aspergillus fumigatus, m3: <0.1 kU/L
Bahia Grass: <0.1 kU/L
Bermuda Grass: <0.1 kU/L
Box Elder IgE: <0.1 kU/L
Candida Albicans: <0.1 kU/L
Cat Dander: <0.1 kU/L
Common Ragweed: <0.1 kU/L
Curvularia lunata: <0.1 kU/L
D. farinae: <0.1 kU/L
Dog Dander: <0.1 kU/L
Elm IgE: <0.1 kU/L
Fescue: <0.1 kU/L
G005 Rye, Perennial: <0.1 kU/L
G009 Red Top: <0.1 kU/L
Goldenrod: <0.1 kU/L
Helminthosporium halodes: <0.1 kU/L
House Dust Hollister: <0.1 kU/L
IgE (Immunoglobulin E), Serum: 4.5 IU/mL (ref 0.0–180.0)
Lamb's Quarters: <0.1 kU/L
Oak: <0.1 kU/L
Plantain: <0.1 kU/L
Stemphylium Botryosum: <0.1 kU/L
Sycamore Tree: <0.1 kU/L
Timothy Grass: <0.1 kU/L

## 2014-01-13 ENCOUNTER — Other Ambulatory Visit: Payer: Self-pay | Admitting: Internal Medicine

## 2014-01-15 NOTE — Assessment & Plan Note (Signed)
Timing fits with seasonal exacerbation Plan- Allergy profile, try Semprex-D

## 2014-01-15 NOTE — Assessment & Plan Note (Signed)
Limited response to manipulation of allergy mechanisms

## 2014-01-17 NOTE — Telephone Encounter (Signed)
Ok to refill both as before 

## 2014-01-17 NOTE — Telephone Encounter (Signed)
CY, please advise if you are okay with giving refill of Fioricet? I do not see where you have every given the med. Thanks.

## 2014-01-17 NOTE — Telephone Encounter (Signed)
Called refills to pharmacy.

## 2014-04-06 ENCOUNTER — Encounter: Payer: Self-pay | Admitting: Internal Medicine

## 2014-04-12 ENCOUNTER — Encounter: Payer: Self-pay | Admitting: Family Medicine

## 2014-04-12 ENCOUNTER — Ambulatory Visit (INDEPENDENT_AMBULATORY_CARE_PROVIDER_SITE_OTHER): Payer: 59 | Admitting: Family Medicine

## 2014-04-12 VITALS — BP 123/81 | Ht 64.0 in | Wt 124.0 lb

## 2014-04-12 DIAGNOSIS — M79674 Pain in right toe(s): Secondary | ICD-10-CM

## 2014-04-12 DIAGNOSIS — M79609 Pain in unspecified limb: Secondary | ICD-10-CM

## 2014-04-12 DIAGNOSIS — S90821A Blister (nonthermal), right foot, initial encounter: Secondary | ICD-10-CM

## 2014-04-12 DIAGNOSIS — IMO0002 Reserved for concepts with insufficient information to code with codable children: Secondary | ICD-10-CM

## 2014-04-12 DIAGNOSIS — M7672 Peroneal tendinitis, left leg: Secondary | ICD-10-CM

## 2014-04-12 DIAGNOSIS — M7750 Other enthesopathy of unspecified foot: Secondary | ICD-10-CM

## 2014-04-12 NOTE — Patient Instructions (Signed)
For peroneal tendinitis left ankle, - Ice after running for 15 min - Heel drops with toes turned out 2 x 30 - Will discuss compression sleeve if not improving  For big toe - Shoes with wider toe box - Try insole modifications

## 2014-04-13 NOTE — Progress Notes (Signed)
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 Her greatest concern is with regards to her right foot. She notes that she has been getting blisters on the medial aspect of the foot over the first MTP joint as well as over the medial great toe. This has been worse for the last month and has been persistent and will not go away. She also gets pain on the plantar surface of her first MTP joint if she runs more than 5K. She's tried blister Band-Aid, donuts, and moleskin. She does note that she has a size 5-1/2 foot and so it is difficult to find shoes that fit. She is also starting to get some lateral ankle pain in the distribution of the peroneal tendon.  ROS: As above in the HPI. All other systems are stable or negative.  OBJECTIVE: APPEARANCE:  Patient in no acute distress.The patient appeared well nourished and normally developed. HEENT: No scleral icterus. Conjunctiva non-injected Resp: Non labored Skin: No rash MSK:  Foot exam: - Neutral arch - Blisters off of the medial aspect of the right first MTP joint and medial great toe near the IP joint. - Shoes have very narrow toe box. With of forefoot is 9.5 cm. Toebox only 8 cm - Excellent motion at the first MTP joint with no hallux rigidus - No pain with resisted great toe flexion with excellent 5 out of 5 strength - Excellent running gait - Normal turning in of the calcaneus with heel lift   MSK [place]: Not performed   ASSESSMENT: #1. Persistent right foot blisters secondary to poor shoe fit with toebox too narrow for the forefoot #2. First MTP joint pain, favor strain from poor arch support versus mild flexor hallucis tendinopathy #3. Left peroneal tendinopathy, early   PLAN: For her foot problems, I did explain to her that she needs to find a shoe with a toebox a more peripherally fit her foot. I also added a scaphoid pad to both shoe inserts to better  support her arch and take some of the pressure off her great toe. I did place a blue foam pad under the first MTP joint of the right foot for additional cushioning. Finally, she was given some he eccentric heel drop exercises with toes turned out for peroneal tendinopathy. We will see her back to check on her shoes that when she obtains properly fitting shoes.

## 2014-05-06 ENCOUNTER — Telehealth: Payer: Self-pay | Admitting: Internal Medicine

## 2014-05-06 MED ORDER — CYCLOBENZAPRINE HCL 5 MG PO TABS: 5.0000 mg | ORAL_TABLET | Freq: Three times a day (TID) | ORAL | Status: DC | PRN

## 2014-05-06 MED ORDER — PREDNISONE (PAK) 10 MG PO TABS: ORAL_TABLET | ORAL | Status: DC

## 2014-05-06 NOTE — Telephone Encounter (Signed)
Pt with L arm numbness <24h since"pop" in neck No pain or weakness Has contacted [facility] due to hx C6-7 issues in past Min improved with Aleve but requests rx for weekend relief - Pt agrees to call if sx worse or unimproved

## 2014-05-10 ENCOUNTER — Encounter: Payer: Self-pay | Admitting: Internal Medicine

## 2014-05-12 ENCOUNTER — Ambulatory Visit: Payer: 59 | Admitting: Sports Medicine

## 2014-07-01 ENCOUNTER — Ambulatory Visit: Payer: 59 | Admitting: Internal Medicine

## 2014-08-01 ENCOUNTER — Ambulatory Visit (INDEPENDENT_AMBULATORY_CARE_PROVIDER_SITE_OTHER): Payer: 59 | Admitting: Internal Medicine

## 2014-08-01 ENCOUNTER — Encounter: Payer: Self-pay | Admitting: Internal Medicine

## 2014-08-01 VITALS — BP 110/64 | HR 73 | Ht 64.0 in | Wt 134.6 lb

## 2014-08-01 DIAGNOSIS — J300 Vasomotor rhinitis: Secondary | ICD-10-CM

## 2014-08-01 DIAGNOSIS — Z23 Encounter for immunization: Secondary | ICD-10-CM

## 2014-08-01 NOTE — Patient Instructions (Addendum)
We can refill Dymista when needed   Consider trying otc Alkalol as a nasal rinse in a Neti pot or squeeze bottle   Pneumovax-23 pneumococcal vaccine

## 2014-08-01 NOTE — Assessment & Plan Note (Signed)
Most of her triggers seem nonallergic. It has been difficult for her to stay comfortable Plan- try nasal rinse with otc Alkalol, given pneumovax
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
## 2014-09-12 ENCOUNTER — Telehealth (HOSPITAL_COMMUNITY): Payer: Self-pay | Admitting: Family Medicine

## 2014-09-12 ENCOUNTER — Encounter: Payer: Self-pay | Admitting: Internal Medicine

## 2014-09-12 ENCOUNTER — Encounter (HOSPITAL_COMMUNITY): Payer: Self-pay | Admitting: Emergency Medicine

## 2014-09-12 ENCOUNTER — Emergency Department (HOSPITAL_COMMUNITY)
Admission: EM | Admit: 2014-09-12 | Discharge: 2014-09-12 | Disposition: A | Discharge status: Home / Self Care | Payer: 59 | Source: Home / Self Care | Attending: Family Medicine | Admitting: Family Medicine

## 2014-09-12 DIAGNOSIS — R1013 Epigastric pain: Secondary | ICD-10-CM

## 2014-09-12 LAB — CBC WITH DIFFERENTIAL/PLATELET
Basophils Absolute: 0 10*3/uL (ref 0.0–0.1)
Basophils Relative: 1 % (ref 0–1)
Eosinophils Absolute: 0.1 10*3/uL (ref 0.0–0.7)
Eosinophils Relative: 2 % (ref 0–5)
HCT: 40.3 % (ref 36.0–46.0)
Hemoglobin: 13.8 g/dL (ref 12.0–15.0)
Lymphocytes Relative: 44 % (ref 12–46)
Lymphs Abs: 1.6 10*3/uL (ref 0.7–4.0)
MCH: 31.9 pg (ref 26.0–34.0)
MCHC: 34.2 g/dL (ref 30.0–36.0)
MCV: 93.3 fL (ref 78.0–100.0)
Monocytes Absolute: 0.5 10*3/uL (ref 0.1–1.0)
Monocytes Relative: 12 % (ref 3–12)
Neutro Abs: 1.5 10*3/uL — ABNORMAL LOW (ref 1.7–7.7)
Neutrophils Relative %: 41 % — ABNORMAL LOW (ref 43–77)
Platelets: 266 10*3/uL (ref 150–400)
RBC: 4.32 MIL/uL (ref 3.87–5.11)
RDW: 12.2 % (ref 11.5–15.5)
WBC: 3.7 10*3/uL — ABNORMAL LOW (ref 4.0–10.5)

## 2014-09-12 LAB — COMPREHENSIVE METABOLIC PANEL
ALT: 14 U/L (ref 0–35)
AST: 21 U/L (ref 0–37)
Albumin: 3.9 g/dL (ref 3.5–5.2)
Alkaline Phosphatase: 95 U/L (ref 39–117)
Anion gap: 12 (ref 5–15)
BUN: 7 mg/dL (ref 6–23)
CO2: 29 mEq/L (ref 19–32)
Calcium: 9.7 mg/dL (ref 8.4–10.5)
Chloride: 101 mEq/L (ref 96–112)
Creatinine, Ser: 0.95 mg/dL (ref 0.50–1.10)
GFR calc Af Amer: 79 mL/min — ABNORMAL LOW (ref >90)
GFR calc non Af Amer: 68 mL/min — ABNORMAL LOW (ref >90)
Glucose, Bld: 84 mg/dL (ref 70–99)
Potassium: 4.3 mEq/L (ref 3.7–5.3)
Sodium: 142 mEq/L (ref 137–147)
Total Bilirubin: 0.3 mg/dL (ref 0.3–1.2)
Total Protein: 6.9 g/dL (ref 6.0–8.3)

## 2014-09-12 LAB — LIPASE, BLOOD: Lipase: 36 U/L (ref 11–59)

## 2014-09-12 LAB — POCT H PYLORI SCREEN: H. PYLORI SCREEN, POC: NEGATIVE

## 2014-09-12 MED ORDER — ONDANSETRON HCL 4 MG PO TABS: 4.0000 mg | ORAL_TABLET | Freq: Three times a day (TID) | ORAL | Status: DC | PRN

## 2014-09-12 NOTE — ED Notes (Signed)
Pt states she ate [place] food last Monday and her epigastric pain began after that.  It is upper mid abdomen, under the ribcage.  She states she had the same thing happen about a year ago and she was told she may have passed a gallstone.  Pt only drinks socially, last drink was in October.

## 2014-09-12 NOTE — ED Notes (Signed)
Pt called and informed of nml results  Linna Darner, MD Family Medicine 09/12/2014, 11:15 PM    Waldemar Dickens, MD 09/12/14 [phone]

## 2014-09-12 NOTE — Discharge Instructions (Signed)
The cause of your abdominal pain is not clear. This could be viral gastroenteritis, pancreatitis, gallstones. Your H Pylori test was negative.  We will check for significant sign of infection or biliary pathology Please start the zofran and stay well hydrated Please go to the emergency room if you get significantly worse

## 2014-09-12 NOTE — ED Provider Notes (Addendum)
CSN: [id number]     Arrival date & time 09/12/14  0827 History   First MD Initiated Contact with Patient 09/12/14 0840     Chief Complaint  Patient presents with  . Abdominal Pain    x1 week  . Nausea   (Consider location/radiation/quality/duration/timing/severity/associated sxs/prior Treatment) HPI  [place] food 8 days ago. Developed indigestion that night. Getting worse. Worse w/ eating. Epigastric w/o radiation. Achy in nature and occasionally sharp. Denies any heartburn or worsening w/ lying down. H/o constipation, stooling 2-3x wkly. Denies fevers, vomiting, rash, dysuria, frequency. No sick contacts - works at [employer].   Same symptoms last year. Admitted to hospital and may have passed a gallstone but workup includin HIDA scan and [place] did not show stones  Past Medical History  Diagnosis Date  . Allergic rhinitis   . Migraine   . Elevated transaminase level     ?etiology - present 09/2013 hosp for epigastric pain   Past Surgical History  Procedure Laterality Date  . Partial hysterectomy  2007  . Neck surgery  2009   Family History  Problem Relation Age of Onset  . Hypertension Mother   . Dementia Mother   . Atrial fibrillation Father   . Diabetes type II Father   . Hypertension Father   . Hypertension Brother   . Coronary artery disease Father 65    stent x 2, AoVR   History  Substance Use Topics  . Smoking status: Never Smoker   . Smokeless tobacco: Not on file  . Alcohol Use: No   OB History    No data available     Review of Systems Per HPI with all other pertinent systems negative.   Allergies  Oxaprozin  Home Medications   Prior to Admission medications   Medication Sig Start Date End Date Taking? Authorizing Provider  cimetidine (TAGAMET) 200 MG tablet Take 200 mg by mouth 2 (two) times daily.   Yes Historical Provider, MD  DYMISTA 137-50 MCG/ACT SUSP PLACE 2 SPRAYS INTO BOTH NOSTRILS AT BEDTIME.   Yes Deneise Lever, MD  pantoprazole (PROTONIX) 40  MG tablet Take 1 tablet (40 mg total) by mouth 2 (two) times daily. 10/11/13  Yes Rowe Clack, MD  pseudoephedrine (SUDAFED) 30 MG tablet Take 30 mg by mouth every 6 (six) hours as needed for congestion.   Yes Historical Provider, MD  butalbital-acetaminophen-caffeine (FIORICET, ESGIC) 50-325-40 MG per tablet TAKE 1 TABLET BY MOUTH EVERY 6 HOURS AS NEEDED FOR HEADACHE    Deneise Lever, MD  calcium-vitamin D (OSCAL WITH D) 500-200 MG-UNIT per tablet Take 1 tablet by mouth daily with breakfast.    Historical Provider, MD  ondansetron (ZOFRAN) 4 MG tablet Take 1 tablet (4 mg total) by mouth every 8 (eight) hours as needed for nausea or vomiting. 09/12/14   Waldemar Dickens, MD  oxymetazoline (AFRIN) 0.05 % nasal spray Place 2 sprays into the nose at bedtime as needed for congestion.    Historical Provider, MD  sodium chloride (OCEAN) 0.65 % nasal spray Place 1 spray into the nose 4 (four) times daily as needed for congestion (and dryness).    Historical Provider, MD   BP 131/87 mmHg  Pulse 75  Temp(Src) 98.2 F (36.8 C) (Oral)  Resp 16  SpO2 100% Physical Exam  Constitutional: She is oriented to person, place, and time. She appears well-developed and well-nourished. No distress.  HENT:  Head: Normocephalic and atraumatic.  Eyes: EOM are normal. Pupils are equal,  round, and reactive to light.  Neck: Normal range of motion.  Cardiovascular: Normal rate and normal heart sounds.   Pulmonary/Chest: Effort normal and breath sounds normal.  Abdominal: Soft.  Hypoactive bowel sounds No masses,  Mild epigastric pain. Negative Murphy's sign Non-ttp at McBurny's point  Musculoskeletal: Normal range of motion.  Neurological: She is alert and oriented to person, place, and time.  Skin: Skin is warm and dry. She is not diaphoretic.  Psychiatric: She has a normal mood and affect. Her behavior is normal. Judgment and thought content normal.    ED Course  Procedures (including critical care  time) Labs Review Labs Reviewed  CBC WITH DIFFERENTIAL  COMPREHENSIVE METABOLIC PANEL  LIPASE, BLOOD    Imaging Review No results found.   MDM   1. Epigastric pain    Etiology unclear.  Reflux vs Hpylori, vs gallstones, vs pancreatitis vs viral gastro vs constipation Zofran, fluids, rest Continue PPI  Hpylori negative  CMET, CBC w diff, Lipase  F/u in ED if worsens.  Precautions given and all questions answered  Linna Darner, MD Family Medicine 09/12/2014, 9:23 AM      Waldemar Dickens, MD 09/12/14 [place], MD 09/12/14 [phone]

## 2014-09-19 ENCOUNTER — Encounter: Payer: Self-pay | Admitting: Internal Medicine

## 2014-09-19 ENCOUNTER — Ambulatory Visit (INDEPENDENT_AMBULATORY_CARE_PROVIDER_SITE_OTHER): Payer: 59 | Admitting: Internal Medicine

## 2014-09-19 VITALS — BP 130/68 | HR 64 | Temp 97.9°F | Wt 135.0 lb

## 2014-09-19 DIAGNOSIS — K5900 Constipation, unspecified: Secondary | ICD-10-CM

## 2014-09-19 DIAGNOSIS — J310 Chronic rhinitis: Secondary | ICD-10-CM

## 2014-09-19 DIAGNOSIS — R1013 Epigastric pain: Secondary | ICD-10-CM

## 2014-09-19 NOTE — Progress Notes (Signed)
Pre visit review using our clinic review tool, if applicable. No additional management support is needed unless otherwise documented below in the visit note. 

## 2014-09-19 NOTE — Progress Notes (Signed)
   Subjective:    Patient ID: Mindy [name], female    DOB: Dec 26, 1960, 53 y.o.   MRN: [id number]  HPI   She has had epigastric discomfort which began after Asian food 2 weeks ago. The pain was severe is a 6-7 in the epigastric area. It has actually improved; but it is worse after meals. Today it was a level III after her meal.  She was seen at urgent care 11/23; labs were normal. Last year she has been evaluated for possible gallstones; liver function test and lipase were elevated at that time.  She also describes reflux which is worse when she runs. She does run 3 times a week  She has been using Protonix. She used Tagamet if Protonix ineffective.  She describes chronic constipation and bloating. She has to take multiple Senna to have a bowel movement      Review of Systems   She denies unexplained weight loss, melena, rectal bleeding.  She has perennial rhinitis and takes Sudafed on a regular basis     Objective:   Physical Exam General appearance is one of good health and nourishment w/o distress.  Eyes: No conjunctival inflammation or scleral icterus is present.  Oral exam: Dental hygiene is good; lips and gums are healthy appearing.There is no oropharyngeal erythema or exudate noted.   Heart:  Normal rate and regular rhythm. S1 and S2 normal without gallop, murmur, click, rub or other extra sounds     Lungs:Chest clear to auscultation; no wheezes, rhonchi,rales ,or rubs present.No increased work of breathing.   Abdomen: bowel sounds normal, soft and non-tender without masses, organomegaly or hernias noted.  No guarding or rebound . Umbilical jewelry  Musculoskeletal: Able to lie flat and sit up without help. Negative straight leg raising bilaterally. Gait normal  Skin:Warm & dry.  Intact without suspicious lesions or rashes ; no jaundice or tenting  Lymphatic: No lymphadenopathy is noted about the head, neck, axilla              Assessment & Plan:  #1  epigastric discomfort in the context of reflux  #2 perennial rhinitis  Plan: See after visit summary

## 2014-09-19 NOTE — Patient Instructions (Addendum)
Reflux of gastric acid may be asymptomatic as this may occur mainly during sleep.The triggers for reflux  include stress; the "aspirin family" ; alcohol; peppermint; and caffeine (coffee, tea, cola, and chocolate). The aspirin family would include aspirin and the nonsteroidal agents such as ibuprofen &  Naproxen. Tylenol would not cause reflux. If having symptoms ; food & drink should be avoided for @ least 2 hours before going to bed.  Until the pain resolves; take the Protonix,protein pump inhibitor, 30 minutes before breakfast and 30 minutes before the evening meal.   Plain Mucinex (NOT D) for thick secretions ;force NON dairy fluids .   Nasal cleansing in the shower as discussed with lather of mild shampoo.After 10 seconds wash off lather while  exhaling through nostrils. Make sure that all residual soap is removed to prevent irritation.  Flonase OR Nasacort AQ 1 spray in each nostril twice a day as needed. Use the "crossover" technique into opposite nostril spraying toward opposite ear @ 45 degree angle, not straight up into nostril.  Use a Neti pot daily only  as needed for significant sinus congestion; going from open side to congested side . Plain Allegra (NOT D )  160 daily , Loratidine 10 mg , OR Zyrtec 10 mg @ bedtime  as needed for itchy eyes & sneezing.

## 2014-09-26 ENCOUNTER — Other Ambulatory Visit: Payer: Self-pay | Admitting: Internal Medicine

## 2014-10-18 ENCOUNTER — Telehealth: Payer: Self-pay | Admitting: Nurse Practitioner

## 2014-10-18 DIAGNOSIS — J208 Acute bronchitis due to other specified organisms: Secondary | ICD-10-CM

## 2014-10-18 MED ORDER — BENZONATATE 100 MG PO CAPS: 100.0000 mg | ORAL_CAPSULE | Freq: Two times a day (BID) | ORAL | Status: DC | PRN

## 2014-10-18 NOTE — Progress Notes (Signed)
We are sorry that you are not feeling well.  Here is how we plan to help!  Based on what you have shared with me it looks like you have upper respiratory tract inflammation that has resulted in a signification cough.  Inflammation and infection in the upper respiratory tract is commonly called bronchitis and has four common causes:  Allergies, Viral Infections, Acid Reflux and Bacterial Infections.  Allergies, viruses and acid reflux are treated by controlling symptoms or eliminating the cause. An example might be a cough caused by taking certain blood pressure medications. You stop the cough by changing the medication. Another example might be a cough caused by acid reflux. Controlling the reflux helps control the cough.  Based on your presentation I believe you most likely have A cough due to a virus.  This is called viral bronchitis and is best treated by rest, plenty of fluids and control of the cough.  You may use Ibuprofen or Tylenol as directed to help your symptoms.    In addition you may use A prescription cough medication called Tessalon Perles 100mg . You may take 1-2 capsules every 8 hours as needed for your cough.    HOME CARE . Only take medications as instructed by your medical team. . Complete the entire course of an antibiotic. . Drink plenty of fluids and get plenty of rest. . Avoid close contacts especially the very young and the elderly . Cover your mouth if you cough or cough into your sleeve. . Always remember to wash your hands . A steam or ultrasonic humidifier can help congestion.    GET HELP RIGHT AWAY IF: . You develop worsening fever. . You become short of breath . You cough up blood. . Your symptoms persist after you have completed your treatment plan MAKE SURE YOU   Understand these instructions.  Will watch your condition.  Will get help right away if you are not doing well or get worse.  Your e-visit answers were reviewed by a board certified advanced  clinical practitioner to complete your personal care plan.  Depending on the condition, your plan could have included both over the counter or prescription medications.  If there is a problem please reply  once you have received a response from your provider.  Your safety is important to [place].  If you have drug allergies check your prescription carefully.    You can use MyChart to ask questions about today's visit, request a non-urgent call back, or ask for a work or school excuse.  You will get an e-mail in the next two days asking about your experience.  I hope that your e-visit has been valuable and will speed your recovery. Thank you for using e-visits.

## 2014-10-21 ENCOUNTER — Telehealth: Payer: Self-pay | Admitting: Nurse Practitioner

## 2014-10-21 DIAGNOSIS — Z8709 Personal history of other diseases of the respiratory system: Secondary | ICD-10-CM

## 2014-10-21 DIAGNOSIS — J0100 Acute maxillary sinusitis, unspecified: Secondary | ICD-10-CM

## 2014-10-21 HISTORY — DX: Personal history of other diseases of the respiratory system: Z87.09

## 2014-10-21 HISTORY — PX: UPPER GI ENDOSCOPY: SHX6162

## 2014-10-21 MED ORDER — AZITHROMYCIN 250 MG PO TABS: ORAL_TABLET | ORAL | Status: DC

## 2014-10-21 NOTE — Progress Notes (Signed)

## 2014-10-27 ENCOUNTER — Encounter (HOSPITAL_COMMUNITY): Payer: Self-pay | Admitting: Emergency Medicine

## 2014-10-27 ENCOUNTER — Emergency Department (HOSPITAL_COMMUNITY)
Admission: EM | Admit: 2014-10-27 | Discharge: 2014-10-27 | Disposition: A | Discharge status: Home / Self Care | Payer: 59 | Source: Home / Self Care | Attending: Emergency Medicine | Admitting: Emergency Medicine

## 2014-10-27 DIAGNOSIS — J019 Acute sinusitis, unspecified: Secondary | ICD-10-CM

## 2014-10-27 DIAGNOSIS — J209 Acute bronchitis, unspecified: Secondary | ICD-10-CM

## 2014-10-27 MED ORDER — ALBUTEROL SULFATE HFA 108 (90 BASE) MCG/ACT IN AERS: 2.0000 | INHALATION_SPRAY | Freq: Four times a day (QID) | RESPIRATORY_TRACT | Status: DC

## 2014-10-27 MED ORDER — FLUTICASONE PROPIONATE 50 MCG/ACT NA SUSP: 2.0000 | Freq: Every day | NASAL | Status: DC

## 2014-10-27 MED ORDER — PREDNISONE 20 MG PO TABS: 20.0000 mg | ORAL_TABLET | Freq: Two times a day (BID) | ORAL | Status: DC

## 2014-10-27 MED ORDER — AMOXICILLIN-POT CLAVULANATE 875-125 MG PO TABS: 1.0000 | ORAL_TABLET | Freq: Two times a day (BID) | ORAL | Status: DC

## 2014-10-27 MED ORDER — HYDROCOD POLST-CHLORPHEN POLST 10-8 MG/5ML PO LQCR: 5.0000 mL | Freq: Two times a day (BID) | ORAL | Status: DC | PRN

## 2014-10-27 NOTE — ED Provider Notes (Signed)
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 The cough is productive of thin white sputum, and she's had some tightness in her chest. She denies any shortness of breath or chest pain. She's also had some nasal congestion with clear drainage, facial pain, headache, and ear congestion. She denies fever, chills, or sore throat. She's had no GI symptoms.  Review of Systems   Other than as noted above, the patient denies any of the following symptoms: Systemic:  No fevers, chills, sweats, or myalgias. Eye:  No redness or discharge. ENT:  No ear pain, headache, nasal congestion, drainage, sinus pressure, or sore throat. Neck:  No neck pain, stiffness, or swollen glands. Lungs:  No cough, sputum production, hemoptysis, wheezing, chest tightness, shortness of breath or chest pain. GI:  No abdominal pain, nausea, vomiting or diarrhea.  [place]   Past medical history, family history, social history, meds, and allergies were reviewed. She is allergic to Daypro. She has taken a course of Z-Pak and also use Delsym for the cough.  Physical exam   Vital signs:  BP 116/70 mmHg  Pulse 75  Temp(Src) 98.7 F (37.1 C) (Oral)  Resp 16  SpO2 99% General:  Alert and oriented.  In no distress.  Skin warm and dry. Eye:  No conjunctival injection or drainage. Lids were normal. ENT:  TMs and canals were normal, without erythema or inflammation.  Nasal mucosa was clear and uncongested, without drainage.  Mucous membranes were moist.  Pharynx was clear with no exudate or drainage.  There were no oral ulcerations or lesions. Neck:  Supple, no adenopathy, tenderness or mass. Lungs:  No respiratory distress.  Lungs were clear to auscultation, without wheezes, rales or rhonchi.  Breath sounds were clear and equal bilaterally.  Heart:  Regular rhythm, without  gallops, murmers or rubs. Skin:  Clear, warm, and dry, without rash or lesions.    Assessment     The primary encounter diagnosis was Acute sinusitis, recurrence not specified, unspecified location. A diagnosis of Acute bronchitis, unspecified organism was also pertinent to this visit.  Plan    1.  Meds:  The following meds were prescribed:   New Prescriptions   ALBUTEROL (PROVENTIL HFA;VENTOLIN HFA) 108 (90 BASE) MCG/ACT INHALER    Inhale 2 puffs into the lungs 4 (four) times daily.   AMOXICILLIN-CLAVULANATE (AUGMENTIN) 875-125 MG PER TABLET    Take 1 tablet by mouth 2 (two) times daily.   CHLORPHENIRAMINE-HYDROCODONE (TUSSIONEX) 10-8 MG/5ML LQCR    Take 5 mLs by mouth every 12 (twelve) hours as needed for cough.   FLUTICASONE (FLONASE) 50 MCG/ACT NASAL SPRAY    Place 2 sprays into both nostrils daily.   PREDNISONE (DELTASONE) 20 MG TABLET    Take 1 tablet (20 mg total) by mouth 2 (two) times daily.    2.  Patient Education/Counseling:  The patient was given appropriate handouts, self care instructions, and instructed in symptomatic relief.  Instructed to get extra fluids and extra rest.    3.  Follow up:  The patient was told to follow up here if no better in 3 to 4 days, or sooner if becoming worse in any way, and given some red flag symptoms such as increasing fever, difficulty breathing, chest pain, or persistent vomiting which would prompt immediate return.  Harden Mo, MD 10/27/14 [phone]

## 2014-10-27 NOTE — ED Notes (Signed)
C/o  Sinus pressure and pain.  HA.  Nasal stuffiness.  Productive cough.   Pt has tried tessalon pearls and z pack with no relief.  Denies fever, n/v/d.  Symptoms present x 2 wks.

## 2014-10-27 NOTE — Discharge Instructions (Signed)
Acute Bronchitis Bronchitis is inflammation of the airways that extend from the windpipe into the lungs (bronchi). The inflammation often causes mucus to develop. This leads to a cough, which is the most common symptom of bronchitis.  In acute bronchitis, the condition usually develops suddenly and goes away over time, usually in a couple weeks. Smoking, allergies, and asthma can make bronchitis worse. Repeated episodes of bronchitis may cause further lung problems.  CAUSES Acute bronchitis is most often caused by the same virus that causes a cold. The virus can spread from person to person (contagious) through coughing, sneezing, and touching contaminated objects. SIGNS AND SYMPTOMS   Cough.   Fever.   Coughing up mucus.   Body aches.   Chest congestion.   Chills.   Shortness of breath.   Sore throat.  DIAGNOSIS  Acute bronchitis is usually diagnosed through a physical exam. Your health care provider will also ask you questions about your medical history. Tests, such as chest X-rays, are sometimes done to rule out other conditions.  TREATMENT  Acute bronchitis usually goes away in a couple weeks. Oftentimes, no medical treatment is necessary. Medicines are sometimes given for relief of fever or cough. Antibiotic medicines are usually not needed but may be prescribed in certain situations. In some cases, an inhaler may be recommended to help reduce shortness of breath and control the cough. A cool mist vaporizer may also be used to help thin bronchial secretions and make it easier to clear the chest.  HOME CARE INSTRUCTIONS  Get plenty of rest.   Drink enough fluids to keep your urine clear or pale yellow (unless you have a medical condition that requires fluid restriction). Increasing fluids may help thin your respiratory secretions (sputum) and reduce chest congestion, and it will prevent dehydration.   Take medicines only as directed by your health care provider.  If  you were prescribed an antibiotic medicine, finish it all even if you start to feel better.  Avoid smoking and secondhand smoke. Exposure to cigarette smoke or irritating chemicals will make bronchitis worse. If you are a smoker, consider using nicotine gum or skin patches to help control withdrawal symptoms. Quitting smoking will help your lungs heal faster.   Reduce the chances of another bout of acute bronchitis by washing your hands frequently, avoiding people with cold symptoms, and trying not to touch your hands to your mouth, nose, or eyes.   Keep all follow-up visits as directed by your health care provider.  SEEK MEDICAL CARE IF: Your symptoms do not improve after 1 week of treatment.  SEEK IMMEDIATE MEDICAL CARE IF:  You develop an increased fever or chills.   You have chest pain.   You have severe shortness of breath.  You have bloody sputum.   You develop dehydration.  You faint or repeatedly feel like you are going to pass out.  You develop repeated vomiting.  You develop a severe headache. MAKE SURE YOU:   Understand these instructions.  Will watch your condition.  Will get help right away if you are not doing well or get worse. Document Released: 11/14/2004 Document Revised: 02/21/2014 Document Reviewed: 03/30/2013 Exit[employer] Patient Information 2015 [employer]. This information is not intended to replace advice given to you by your health care provider. Make sure you discuss any questions you have with your health care provider. Sinusitis Sinusitis is redness, soreness, and inflammation of the paranasal sinuses. Paranasal sinuses are air pockets within the bones of your face (beneath the   eyes, the middle of the forehead, or above the eyes). In healthy paranasal sinuses, mucus is able to drain out, and air is able to circulate through them by way of your nose. However, when your paranasal sinuses are inflamed, mucus and air can become trapped. This can  allow bacteria and other germs to grow and cause infection. Sinusitis can develop quickly and last only a short time (acute) or continue over a long period (chronic). Sinusitis that lasts for more than 12 weeks is considered chronic.  CAUSES  Causes of sinusitis include:  Allergies.  Structural abnormalities, such as displacement of the cartilage that separates your nostrils (deviated septum), which can decrease the air flow through your nose and sinuses and affect sinus drainage.  Functional abnormalities, such as when the small hairs (cilia) that line your sinuses and help remove mucus do not work properly or are not present. SIGNS AND SYMPTOMS  Symptoms of acute and chronic sinusitis are the same. The primary symptoms are pain and pressure around the affected sinuses. Other symptoms include:  Upper toothache.  Earache.  Headache.  Bad breath.  Decreased sense of smell and taste.  A cough, which worsens when you are lying flat.  Fatigue.  Fever.  Thick drainage from your nose, which often is green and may contain pus (purulent).  Swelling and warmth over the affected sinuses. DIAGNOSIS  Your health care provider will perform a physical exam. During the exam, your health care provider may:  Look in your nose for signs of abnormal growths in your nostrils (nasal polyps).  Tap over the affected sinus to check for signs of infection.  View the inside of your sinuses (endoscopy) using an imaging device that has a light attached (endoscope). If your health care provider suspects that you have chronic sinusitis, one or more of the following tests may be recommended:  Allergy tests.  Nasal culture. A sample of mucus is taken from your nose, sent to a lab, and screened for bacteria.  Nasal cytology. A sample of mucus is taken from your nose and examined by your health care provider to determine if your sinusitis is related to an allergy. TREATMENT  Most cases of acute  sinusitis are related to a viral infection and will resolve on their own within 10 days. Sometimes medicines are prescribed to help relieve symptoms (pain medicine, decongestants, nasal steroid sprays, or saline sprays).  However, for sinusitis related to a bacterial infection, your health care provider will prescribe antibiotic medicines. These are medicines that will help kill the bacteria causing the infection.  Rarely, sinusitis is caused by a fungal infection. In theses cases, your health care provider will prescribe antifungal medicine. For some cases of chronic sinusitis, surgery is needed. Generally, these are cases in which sinusitis recurs more than 3 times per year, despite other treatments. HOME CARE INSTRUCTIONS   Drink plenty of water. Water helps thin the mucus so your sinuses can drain more easily.  Use a humidifier.  Inhale steam 3 to 4 times a day (for example, sit in the bathroom with the shower running).  Apply a warm, moist washcloth to your face 3 to 4 times a day, or as directed by your health care provider.  Use saline nasal sprays to help moisten and clean your sinuses.  Take medicines only as directed by your health care provider.  If you were prescribed either an antibiotic or antifungal medicine, finish it all even if you start to feel better. SEEK IMMEDIATE MEDICAL   CARE IF:  You have increasing pain or severe headaches.  You have nausea, vomiting, or drowsiness.  You have swelling around your face.  You have vision problems.  You have a stiff neck.  You have difficulty breathing. MAKE SURE YOU:   Understand these instructions.  Will watch your condition.  Will get help right away if you are not doing well or get worse. Document Released: 10/07/2005 Document Revised: 02/21/2014 Document Reviewed: 10/22/2011 Exit[employer] Patient Information 2015 [employer]. This information is not intended to replace advice given to you by your health care provider.  Make sure you discuss any questions you have with your health care provider.  

## 2014-11-04 ENCOUNTER — Encounter: Payer: Self-pay | Admitting: Internal Medicine

## 2014-11-04 ENCOUNTER — Ambulatory Visit (INDEPENDENT_AMBULATORY_CARE_PROVIDER_SITE_OTHER)
Admission: RE | Admit: 2014-11-04 | Discharge: 2014-11-04 | Disposition: A | Discharge status: Home / Self Care | Payer: 59 | Source: Ambulatory Visit | Attending: Internal Medicine | Admitting: Internal Medicine

## 2014-11-04 ENCOUNTER — Ambulatory Visit (INDEPENDENT_AMBULATORY_CARE_PROVIDER_SITE_OTHER): Payer: 59 | Admitting: Internal Medicine

## 2014-11-04 VITALS — BP 130/78 | HR 98 | Ht 63.0 in | Wt 138.4 lb

## 2014-11-04 DIAGNOSIS — J45909 Unspecified asthma, uncomplicated: Secondary | ICD-10-CM | POA: Insufficient documentation

## 2014-11-04 DIAGNOSIS — J209 Acute bronchitis, unspecified: Secondary | ICD-10-CM

## 2014-11-04 DIAGNOSIS — J00 Acute nasopharyngitis [common cold]: Secondary | ICD-10-CM

## 2014-11-04 DIAGNOSIS — R071 Chest pain on breathing: Secondary | ICD-10-CM | POA: Insufficient documentation

## 2014-11-04 MED ORDER — METHYLPREDNISOLONE ACETATE 80 MG/ML IJ SUSP
80.0000 mg | Freq: Once | INTRAMUSCULAR | Status: AC
  Administered 2014-11-04: 80 mg via INTRAMUSCULAR

## 2014-11-04 MED ORDER — HYDROCOD POLST-CHLORPHEN POLST 10-8 MG/5ML PO LQCR: 5.0000 mL | Freq: Two times a day (BID) | ORAL | Status: DC | PRN

## 2014-11-04 MED ORDER — LEVALBUTEROL HCL 0.63 MG/3ML IN NEBU
0.6300 mg | INHALATION_SOLUTION | Freq: Once | RESPIRATORY_TRACT | Status: AC
Start: 2014-11-04 — End: 2014-11-04
  Administered 2014-11-04: 0.63 mg via RESPIRATORY_TRACT

## 2014-11-04 MED ORDER — PROMETHAZINE-CODEINE 6.25-10 MG/5ML PO SYRP: 5.0000 mL | ORAL_SOLUTION | Freq: Four times a day (QID) | ORAL | Status: DC | PRN

## 2014-11-04 NOTE — Patient Instructions (Addendum)
Neb xop 0.63  Depo 80  Order CXR  Dx acute bronchitis, left anterior chest wall pain  Finish the augmentin and prednisone  Script for prometh codeine cough syrup (won't last as long as tussionex  Script refilling tussionex  Ok to use Afrin, saline nasal spray, saline nasal gel as needed through the weekend for your nose  Ok to use the tessalon 200 mg every 8 hours as needed for help with daytime cough  Please call as needed till you get through this  Use heating pad and brace with pillow as needed for your sore rib cage
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
## 2014-11-04 NOTE — Assessment & Plan Note (Signed)
Sustained bronchitis syndrome with upper airway/tracheobronchitis pattern, probably viral infection initially. Rhinitis story does not suggest sinusitis yet and she has had antibiotics. Cough control is important. Plan-chest x-ray, finish Augmentin and prednisone. Depo-Medrol to provide slow steroid taper and nebulizer Xopenex. Codeine-based cough syrup, shorter half-life, to allow some choice during daytime. She also has Tessalon 100 and I have suggested she take 200 mg every 8 hours if needed, using available supply this weekend. She will contact us after the weekend as needed.

## 2014-11-04 NOTE — Assessment & Plan Note (Signed)
Acute onset pleuritic left anterior rib cage pain quite consistent with a cracked rib after prolonged coughing. Plan-cough suppression, heating pad, chest x-ray

## 2014-11-15 ENCOUNTER — Ambulatory Visit: Payer: Self-pay | Admitting: Internal Medicine

## 2014-11-18 ENCOUNTER — Encounter: Payer: Self-pay | Admitting: Internal Medicine

## 2014-11-18 MED ORDER — PREDNISONE 5 MG PO TABS: ORAL_TABLET | ORAL | Status: DC

## 2014-11-18 NOTE — Telephone Encounter (Signed)
1/29 mychart email from pt: Message     From my last visit with the bronchitis I got better for about a week after finishing my antibiotics and the prednisone, depomedrol injection. However I have started back with the same cough again and feeling bad. I am not running a fever, nonproductive cough but coughing a lot and still hurting in my ribs on the left side from coughing. Do you recommend that I make another appointment?   Called spoke with patient who reports her cough returned this morning (pretty much dry) and she has been having a HA x3 days but is unsure if this is related to some increased head congestion or stress. Pt stated that her symptoms are identical to her chief complaint from the 1.15.16 office visit with CY.  She does deny any wheezing, tightness, f/c/s, n/v/d, hemoptysis.  Pt uses the [facility].    Dr Annamaria Boots please advise, thank you.  Allergies  Allergen Reactions  . Oxaprozin Hives and Itching    "daypro"   Current Outpatient Prescriptions on File Prior to Visit  Medication Sig Dispense Refill  . acetaminophen (TYLENOL) 325 MG tablet Take 650 mg by mouth every 6 (six) hours as needed.    Marland Kitchen albuterol (PROVENTIL HFA;VENTOLIN HFA) 108 (90 BASE) MCG/ACT inhaler Inhale 2 puffs into the lungs 4 (four) times daily. 1 Inhaler 0  . amoxicillin-clavulanate (AUGMENTIN) 875-125 MG per tablet Take 1 tablet by mouth 2 (two) times daily. 20 tablet 0  . benzonatate (TESSALON) 100 MG capsule Take 1 capsule (100 mg total) by mouth 2 (two) times daily as needed for cough. (Patient not taking: Reported on 11/04/2014) 20 capsule 0  . butalbital-acetaminophen-caffeine (FIORICET, ESGIC) 50-325-40 MG per tablet TAKE 1 TABLET BY MOUTH EVERY 6 HOURS AS NEEDED FOR HEADACHE 75 tablet PRN  . calcium-vitamin D (OSCAL WITH D) 500-200 MG-UNIT per tablet Take 1 tablet by mouth daily with breakfast.    . chlorpheniramine-HYDROcodone (TUSSIONEX) 10-8 MG/5ML LQCR Take 5 mLs by mouth every 12  (twelve) hours as needed for cough. 140 mL 0  . DYMISTA 137-50 MCG/ACT SUSP PLACE 2 SPRAYS INTO BOTH NOSTRILS AT BEDTIME. 23 g PRN  . fluticasone (FLONASE) 50 MCG/ACT nasal spray Place 2 sprays into both nostrils daily. (Patient not taking: Reported on 11/04/2014) 16 g 0  . ondansetron (ZOFRAN) 4 MG tablet Take 1 tablet (4 mg total) by mouth every 8 (eight) hours as needed for nausea or vomiting. 30 tablet 0  . oxymetazoline (AFRIN) 0.05 % nasal spray Place 2 sprays into the nose at bedtime as needed for congestion.    . pantoprazole (PROTONIX) 40 MG tablet TAKE 1 TABLET BY MOUTH TWICE DAILY 60 tablet 11  . predniSONE (DELTASONE) 20 MG tablet Take 1 tablet (20 mg total) by mouth 2 (two) times daily. 14 tablet 0  . promethazine-codeine (PHENERGAN WITH CODEINE) 6.25-10 MG/5ML syrup Take 5 mLs by mouth every 6 (six) hours as needed for cough. 200 mL 0  . pseudoephedrine (SUDAFED) 30 MG tablet Take 30 mg by mouth every 6 (six) hours as needed for congestion.    . sodium chloride (OCEAN) 0.65 % nasal spray Place 1 spray into the nose 4 (four) times daily as needed for congestion (and dryness).     No current facility-administered medications on file prior to visit.

## 2014-11-18 NOTE — Telephone Encounter (Signed)
Called spoke with patient - No, she does not feel the HA is the beginnings of a sinus infection. Advised pt of CY's recommendations otherwise Rx sent to verified pharmacy Nothing further needed; will sign off

## 2014-11-18 NOTE — Addendum Note (Signed)
Addended by: Parke Poisson E on: 11/18/2014 03:13 PM   Modules accepted: Orders

## 2014-11-18 NOTE — Telephone Encounter (Signed)
If there is no fever and nothing purulent, does her headache feel to her like a sinus infection. If so please let me know.  Otherwise- let's see if low dose prednisone could calm down the bronchitis: Prednisone 5 mg, # 30    3 daily x 3 days, 2 daily x 3 days, then one daily

## 2014-12-14 IMAGING — US US ABDOMEN COMPLETE
1 series · 14 of 25 positions shown · non-contrast
Comparison: CT of the chest 09/20/2013, CT of the abdomen and
pelvis 06/20/2006

CLINICAL DATA: Abdominal pain.  Elevated LFTs.

EXAM:
ULTRASOUND ABDOMEN COMPLETE

[Series 1: us abdomen complete · 0.22mm/px · 14 of 71 slices shown]
[im 1/71]
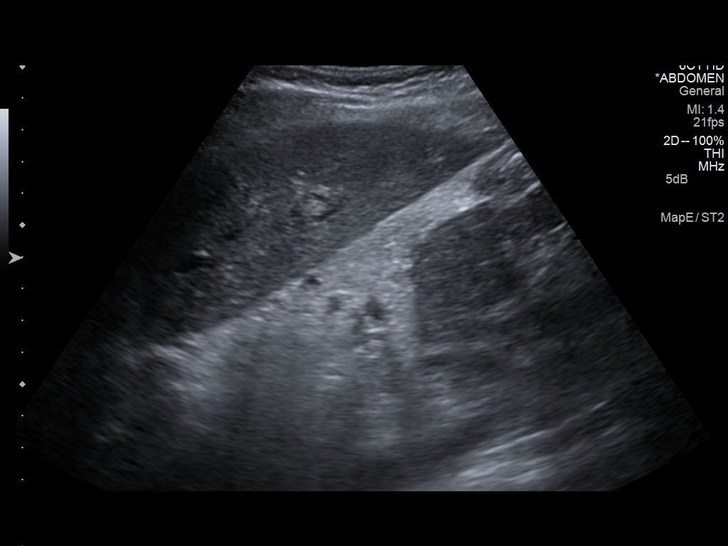
[im 6/71]
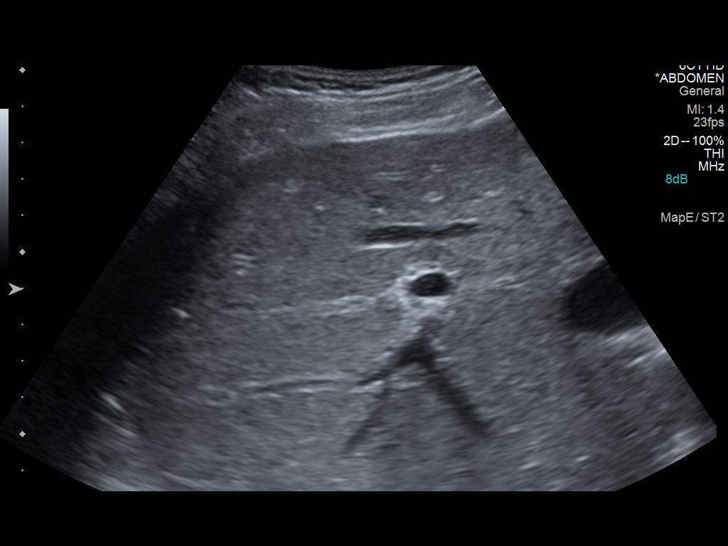
[im 12/71]
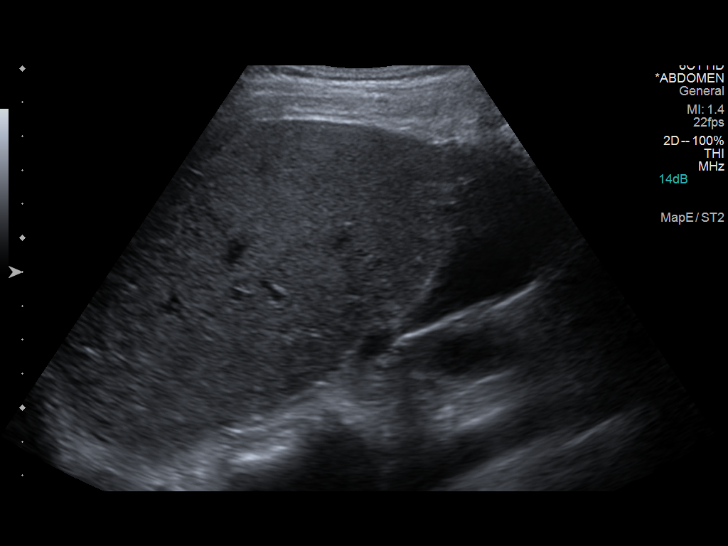
[im 18/71]
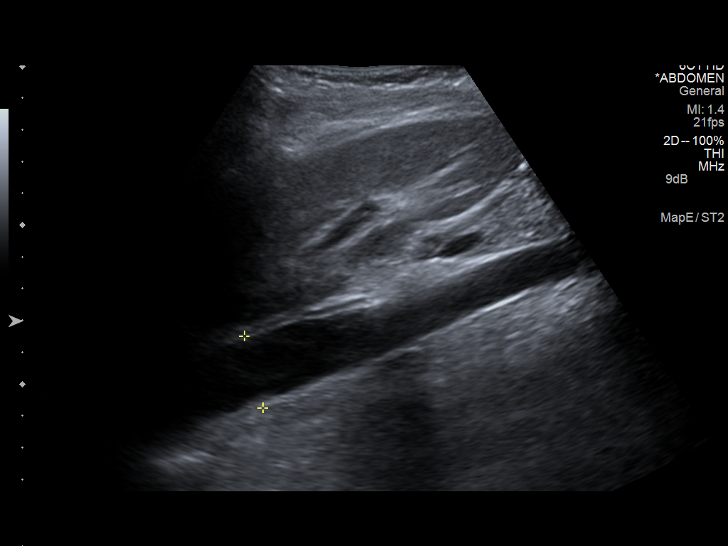
[im 24/71]
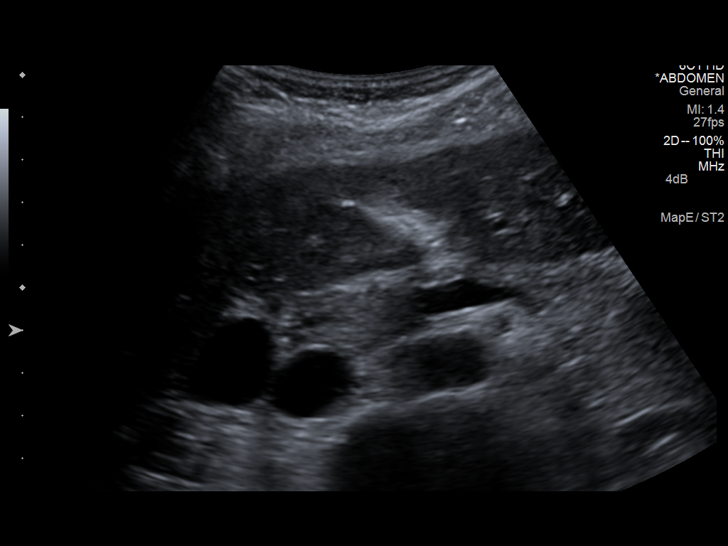
[im 27/71]
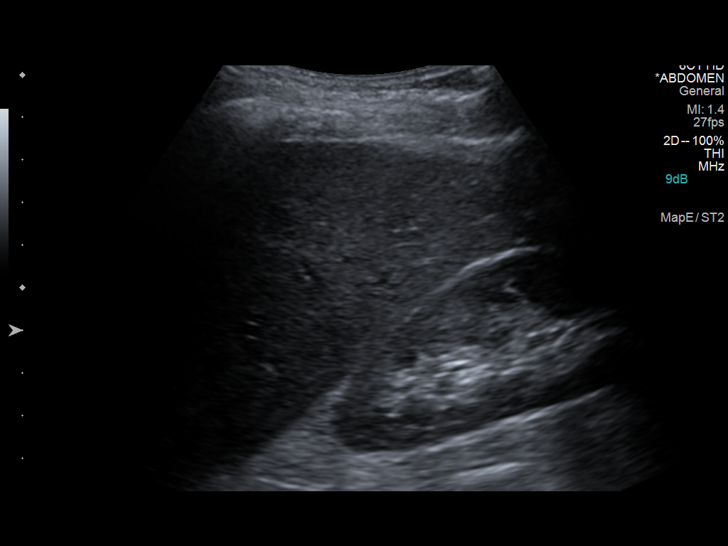
[im 33/71]
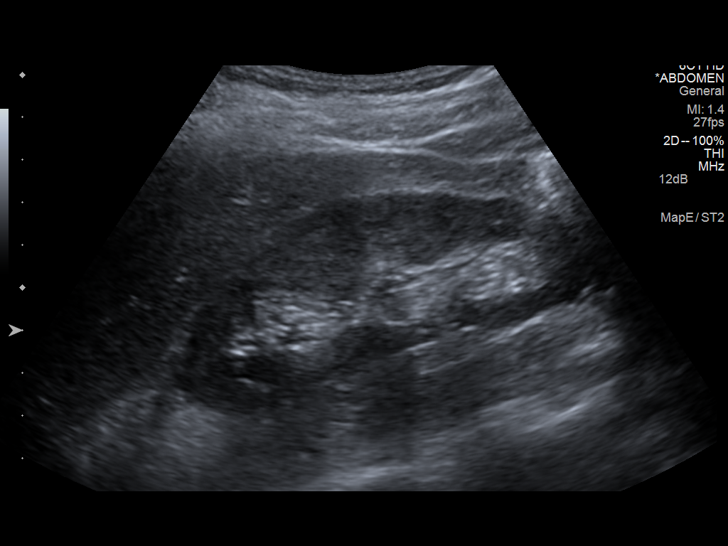
[im 38/71]
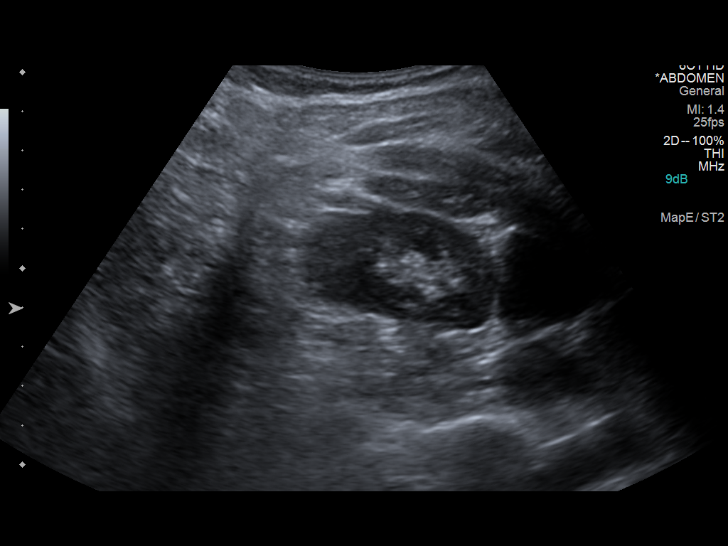
[im 44/71]
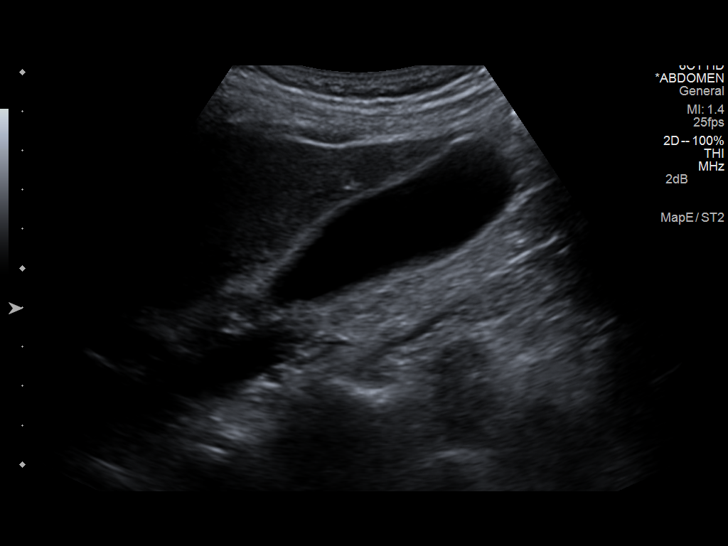
[im 47/71]
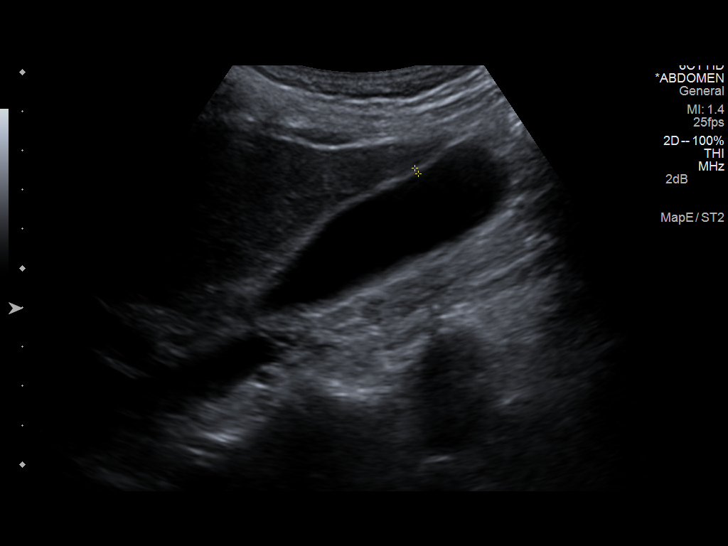
[im 53/71]
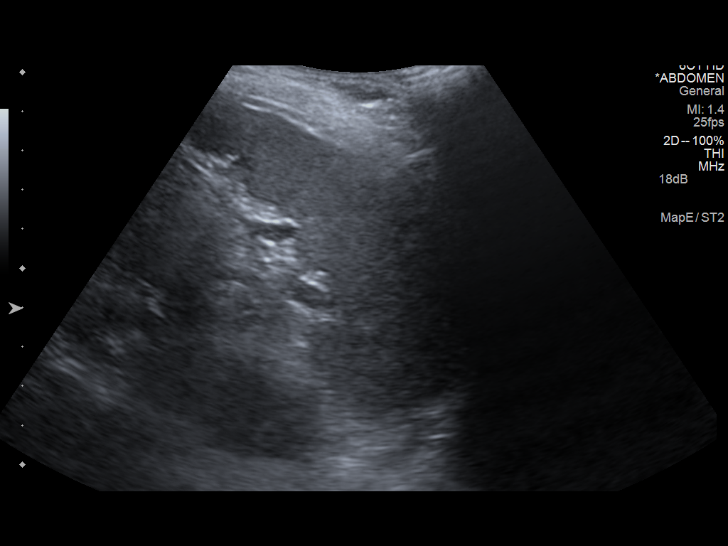
[im 59/71]
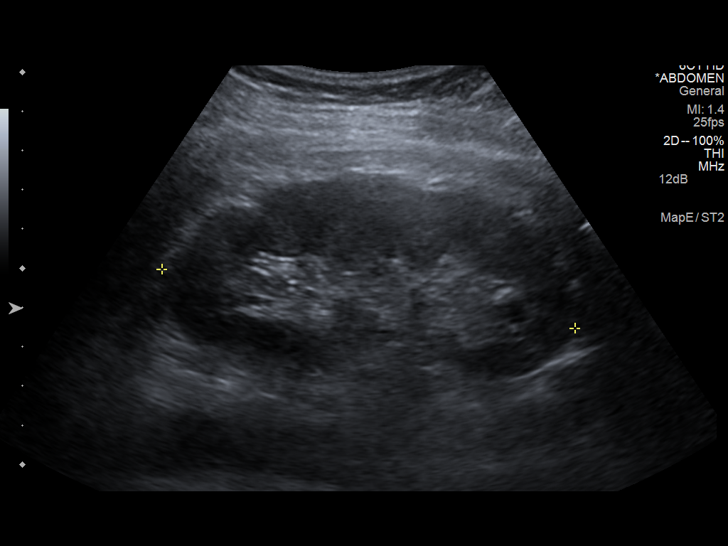
[im 65/71]
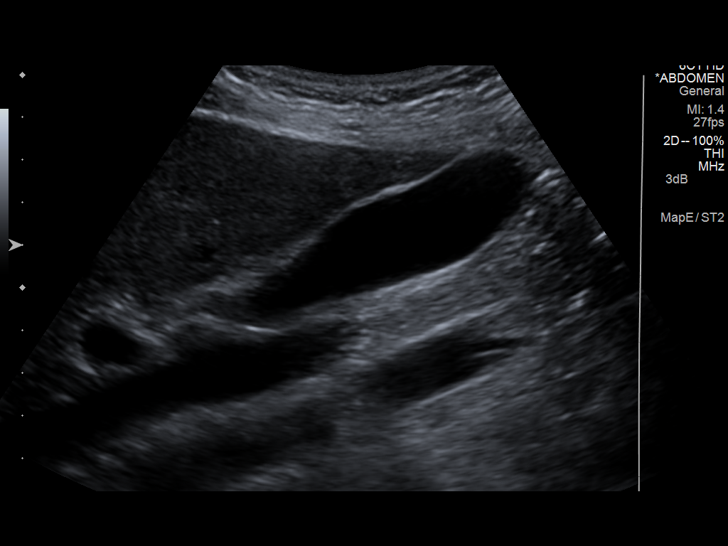
[im 71/71]
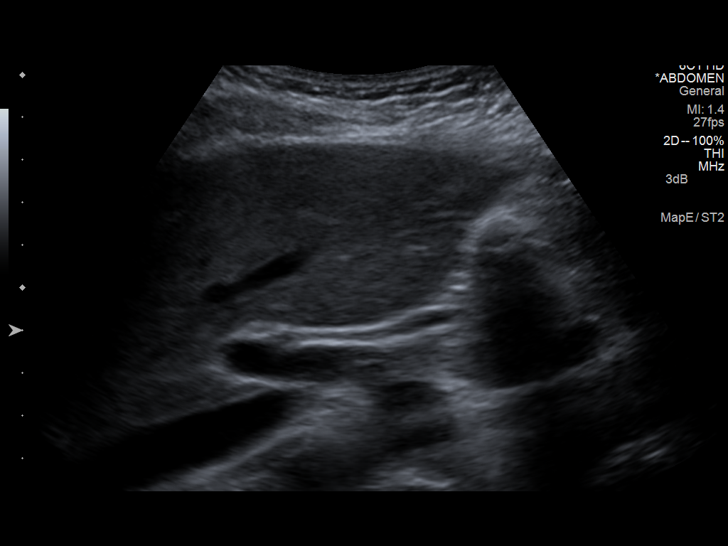

[14 of 25 positions shown; findings below may reference images not displayed]

FINDINGS: Gallbladder:

Gallbladder has a normal appearance. Gallbladder wall is 1.7 mm,
within normal limits. No stones or pericholecystic fluid. No
sonographic Murphy's sign.

Common bile duct:

Diameter: 2.5 mm

Liver:

No focal lesion identified. Within normal limits in parenchymal
echogenicity.

IVC:

No abnormality visualized.

Pancreas:

Visualized portion unremarkable.

Spleen:

Normal in size, 6.4 cm in length.

Right Kidney:

Length: 10.5 cm. Echogenicity within normal limits. No mass or
hydronephrosis visualized.

Left Kidney:

Length: 10.6 cm. Echogenicity within normal limits. No mass or
hydronephrosis visualized.

Abdominal aorta:

No aneurysm visualized.

Other findings:

None
IMPRESSION: 1. No evidence for acute cholecystitis.
2. Normal appearance of the upper abdomen.

## 2014-12-22 ENCOUNTER — Emergency Department (HOSPITAL_COMMUNITY)
Admission: EM | Admit: 2014-12-22 | Discharge: 2014-12-22 | Disposition: A | Discharge status: Home / Self Care | Payer: 59 | Source: Home / Self Care | Attending: Family Medicine | Admitting: Family Medicine

## 2014-12-22 ENCOUNTER — Encounter (HOSPITAL_COMMUNITY): Payer: Self-pay | Admitting: Emergency Medicine

## 2014-12-22 DIAGNOSIS — G43019 Migraine without aura, intractable, without status migrainosus: Secondary | ICD-10-CM

## 2014-12-22 MED ORDER — KETOROLAC TROMETHAMINE 60 MG/2ML IM SOLN
INTRAMUSCULAR | Status: AC
  Filled 2014-12-22: qty 2

## 2014-12-22 MED ORDER — DEXAMETHASONE SODIUM PHOSPHATE 10 MG/ML IJ SOLN
INTRAMUSCULAR | Status: AC
Start: 2014-12-22 — End: 2014-12-22
  Filled 2014-12-22: qty 1

## 2014-12-22 MED ORDER — METOCLOPRAMIDE HCL 5 MG/ML IJ SOLN
5.0000 mg | Freq: Once | INTRAMUSCULAR | Status: AC
  Administered 2014-12-22: 5 mg via INTRAMUSCULAR

## 2014-12-22 MED ORDER — KETOROLAC TROMETHAMINE 60 MG/2ML IM SOLN
60.0000 mg | Freq: Once | INTRAMUSCULAR | Status: AC
  Administered 2014-12-22: 60 mg via INTRAMUSCULAR

## 2014-12-22 MED ORDER — DEXAMETHASONE SODIUM PHOSPHATE 10 MG/ML IJ SOLN
10.0000 mg | Freq: Once | INTRAMUSCULAR | Status: AC
  Administered 2014-12-22: 10 mg via INTRAMUSCULAR

## 2014-12-22 MED ORDER — METOCLOPRAMIDE HCL 5 MG/ML IJ SOLN
INTRAMUSCULAR | Status: AC
  Filled 2014-12-22: qty 2

## 2014-12-22 NOTE — ED Notes (Signed)
C/o  Headache since Tuesday.  Having sensitivity to light.  And nausea.   Mild relief with otc meds.  Denies fever, vomiting, and diarrhea.

## 2014-12-22 NOTE — Discharge Instructions (Signed)
Thank you for coming in today. Go to the emergency room if your headache becomes excruciating or you have weakness or numbness or uncontrolled vomiting.  Return as needed.    Migraine Headache A migraine headache is an intense, throbbing pain on one or both sides of your head. A migraine can last for 30 minutes to several hours. CAUSES  The exact cause of a migraine headache is not always known. However, a migraine may be caused when nerves in the brain become irritated and release chemicals that cause inflammation. This causes pain. Certain things may also trigger migraines, such as:  Alcohol.  Smoking.  Stress.  Menstruation.  Aged cheeses.  Foods or drinks that contain nitrates, glutamate, aspartame, or tyramine.  Lack of sleep.  Chocolate.  Caffeine.  Hunger.  Physical exertion.  Fatigue.  Medicines used to treat chest pain (nitroglycerine), birth control pills, estrogen, and some blood pressure medicines. SIGNS AND SYMPTOMS  Pain on one or both sides of your head.  Pulsating or throbbing pain.  Severe pain that prevents daily activities.  Pain that is aggravated by any physical activity.  Nausea, vomiting, or both.  Dizziness.  Pain with exposure to bright lights, loud noises, or activity.  General sensitivity to bright lights, loud noises, or smells. Before you get a migraine, you may get warning signs that a migraine is coming (aura). An aura may include:  Seeing flashing lights.  Seeing bright spots, halos, or zigzag lines.  Having tunnel vision or blurred vision.  Having feelings of numbness or tingling.  Having trouble talking.  Having muscle weakness. DIAGNOSIS  A migraine headache is often diagnosed based on:  Symptoms.  Physical exam.  A CT scan or MRI of your head. These imaging tests cannot diagnose migraines, but they can help rule out other causes of headaches. TREATMENT Medicines may be given for pain and nausea. Medicines  can also be given to help prevent recurrent migraines.  HOME CARE INSTRUCTIONS  Only take over-the-counter or prescription medicines for pain or discomfort as directed by your health care provider. The use of long-term narcotics is not recommended.  Lie down in a dark, quiet room when you have a migraine.  Keep a journal to find out what may trigger your migraine headaches. For example, write down:  What you eat and drink.  How much sleep you get.  Any change to your diet or medicines.  Limit alcohol consumption.  Quit smoking if you smoke.  Get 7-9 hours of sleep, or as recommended by your health care provider.  Limit stress.  Keep lights dim if bright lights bother you and make your migraines worse. SEEK IMMEDIATE MEDICAL CARE IF:   Your migraine becomes severe.  You have a fever.  You have a stiff neck.  You have vision loss.  You have muscular weakness or loss of muscle control.  You start losing your balance or have trouble walking.  You feel faint or pass out.  You have severe symptoms that are different from your first symptoms. MAKE SURE YOU:   Understand these instructions.  Will watch your condition.  Will get help right away if you are not doing well or get worse. Document Released: 10/07/2005 Document Revised: 02/21/2014 Document Reviewed: 06/14/2013 [facility] Patient Information 2015 [place], [place]. This information is not intended to replace advice given to you by your health care provider. Make sure you discuss any questions you have with your health care provider.

## 2014-12-22 NOTE — ED Provider Notes (Signed)
Mindy [name] is a 54 y.o. female who presents to Urgent Care today for migraine headache present for 2 days. Patient notes photophobia. She denies any nausea, vomiting or diarrhea. She has tried aleve, fiorcet and tylenol which has helped some. The headache is severe and bilateral. Her headache is constant with previous migraines.  She feels well otherwise.    Past Medical History  Diagnosis Date  . Allergic rhinitis   . Migraine   . Elevated transaminase level     ?etiology - present 09/2013 hosp for epigastric pain   Past Surgical History  Procedure Laterality Date  . Partial hysterectomy  2007  . Neck surgery  2009   History  Substance Use Topics  . Smoking status: Never Smoker   . Smokeless tobacco: Not on file  . Alcohol Use: No   ROS as above Medications: Current Facility-Administered Medications  Medication Dose Route Frequency Provider Last Rate Last Dose  . dexamethasone (DECADRON) injection 10 mg  10 mg Intramuscular Once Gregor Hams, MD      . ketorolac (TORADOL) injection 60 mg  60 mg Intramuscular Once Gregor Hams, MD      . metoCLOPramide (REGLAN) injection 5 mg  5 mg Intramuscular Once Gregor Hams, MD       Current Outpatient Prescriptions  Medication Sig Dispense Refill  . DYMISTA 137-50 MCG/ACT SUSP PLACE 2 SPRAYS INTO BOTH NOSTRILS AT BEDTIME. 23 g PRN  . pantoprazole (PROTONIX) 40 MG tablet TAKE 1 TABLET BY MOUTH TWICE DAILY 60 tablet 11  . acetaminophen (TYLENOL) 325 MG tablet Take 650 mg by mouth every 6 (six) hours as needed.    Marland Kitchen albuterol (PROVENTIL HFA;VENTOLIN HFA) 108 (90 BASE) MCG/ACT inhaler Inhale 2 puffs into the lungs 4 (four) times daily. 1 Inhaler 0  . amoxicillin-clavulanate (AUGMENTIN) 875-125 MG per tablet Take 1 tablet by mouth 2 (two) times daily. 20 tablet 0  . benzonatate (TESSALON) 100 MG capsule Take 1 capsule (100 mg total) by mouth 2 (two) times daily as needed for cough. (Patient not taking: Reported on 11/04/2014) 20 capsule 0  .  butalbital-acetaminophen-caffeine (FIORICET, ESGIC) 50-325-40 MG per tablet TAKE 1 TABLET BY MOUTH EVERY 6 HOURS AS NEEDED FOR HEADACHE 75 tablet PRN  . calcium-vitamin D (OSCAL WITH D) 500-200 MG-UNIT per tablet Take 1 tablet by mouth daily with breakfast.    . chlorpheniramine-HYDROcodone (TUSSIONEX) 10-8 MG/5ML LQCR Take 5 mLs by mouth every 12 (twelve) hours as needed for cough. 140 mL 0  . fluticasone (FLONASE) 50 MCG/ACT nasal spray Place 2 sprays into both nostrils daily. (Patient not taking: Reported on 11/04/2014) 16 g 0  . ondansetron (ZOFRAN) 4 MG tablet Take 1 tablet (4 mg total) by mouth every 8 (eight) hours as needed for nausea or vomiting. 30 tablet 0  . oxymetazoline (AFRIN) 0.05 % nasal spray Place 2 sprays into the nose at bedtime as needed for congestion.    . predniSONE (DELTASONE) 20 MG tablet Take 1 tablet (20 mg total) by mouth 2 (two) times daily. 14 tablet 0  . predniSONE (DELTASONE) 5 MG tablet 3 daily x 3 days, 2 daily x 3 days, then one daily 30 tablet 0  . promethazine-codeine (PHENERGAN WITH CODEINE) 6.25-10 MG/5ML syrup Take 5 mLs by mouth every 6 (six) hours as needed for cough. 200 mL 0  . pseudoephedrine (SUDAFED) 30 MG tablet Take 30 mg by mouth every 6 (six) hours as needed for congestion.    . sodium chloride (  OCEAN) 0.65 % nasal spray Place 1 spray into the nose 4 (four) times daily as needed for congestion (and dryness).     Allergies  Allergen Reactions  . Oxaprozin Hives and Itching    "daypro"     Exam:  BP 129/81 mmHg  Pulse 66  Temp(Src) 98.3 F (36.8 C) (Oral)  Resp 18  SpO2 100% Gen: Well NAD HEENT: EOMI,  MMM, PERRLA Lungs: Normal work of breathing. CTABL Heart: RRR no MRG Abd: NABS, Soft. Nondistended, Nontender Exts: Brisk capillary refill, warm and well perfused.  Neuro: AOx3. CN2-12 intace. Normal coordination, reflexes, sensation and strength, balance and gait.   Patient was given a 10mg  Im dexamethasone, 5mg  IM Reglan and 60mg  IM  toradol and felt better  No results found for this or any previous visit (from the past 24 hour(s)). No results found.  Assessment and Plan: 54 y.o. female with Migraine headache. Injections as noted above. Plan to f/u with PCP prn.   Discussed warning signs or symptoms. Please see discharge instructions. Patient expresses understanding.     Gregor Hams, MD 12/22/14 [phone]

## 2015-01-31 ENCOUNTER — Ambulatory Visit: Payer: 59 | Admitting: Internal Medicine

## 2015-02-03 ENCOUNTER — Other Ambulatory Visit: Payer: Self-pay | Admitting: Internal Medicine

## 2015-02-07 NOTE — Telephone Encounter (Signed)
Ok to refill 

## 2015-02-07 NOTE — Telephone Encounter (Signed)
Rx called in to pharmacy. 

## 2015-02-07 NOTE — Telephone Encounter (Signed)
Please advise on refill request

## 2015-03-06 ENCOUNTER — Ambulatory Visit: Payer: 59 | Admitting: Internal Medicine

## 2015-03-27 ENCOUNTER — Ambulatory Visit (INDEPENDENT_AMBULATORY_CARE_PROVIDER_SITE_OTHER): Payer: 59 | Admitting: Internal Medicine

## 2015-03-27 VITALS — BP 100/62 | HR 87 | Ht 63.0 in | Wt 138.0 lb

## 2015-03-27 DIAGNOSIS — J300 Vasomotor rhinitis: Secondary | ICD-10-CM

## 2015-03-27 DIAGNOSIS — J00 Acute nasopharyngitis [common cold]: Secondary | ICD-10-CM

## 2015-03-27 DIAGNOSIS — J209 Acute bronchitis, unspecified: Secondary | ICD-10-CM

## 2015-03-27 NOTE — Patient Instructions (Signed)
Please call if we can help 
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
## 2015-03-28 ENCOUNTER — Encounter: Payer: Self-pay | Admitting: Internal Medicine

## 2015-03-28 NOTE — Assessment & Plan Note (Signed)
Currently in remission. Triggers have usually been weather changes and viral infections. We discussed prospects for good control this summer.

## 2015-03-28 NOTE — Assessment & Plan Note (Signed)
Not much evidence of atopic status. Rhinitis is mostly irritant/vasomotor. Currently symptoms well controlled. Plan-no change.

## 2015-03-30 ENCOUNTER — Ambulatory Visit (INDEPENDENT_AMBULATORY_CARE_PROVIDER_SITE_OTHER): Payer: 59 | Admitting: Sports Medicine

## 2015-03-30 VITALS — BP 141/87 | Ht 64.0 in | Wt 138.4 lb

## 2015-03-30 DIAGNOSIS — M25522 Pain in left elbow: Secondary | ICD-10-CM

## 2015-04-04 NOTE — Progress Notes (Signed)
  Mindy [name] - 54 y.o. female MRN [id number]  Date of birth: 1961/07/02  SUBJECTIVE: Including CC, HPI, ROS HISTORY:  CC: Left elbow pain   HPI: + months of worsening left elbow pain.  Nonradiating.  Denies any numbness or tingling.  She is tried taking occasional anti-inflammatories without significant relief.  Pain does not awaken her at night.  Located over the extensor compartment mainly some tenderness at the lateral elbow.  8 out of 10 at its most severe.  ROS: denies fevers, chills, recent weight gain or weight loss.  No specialty comments available. Social History   Occupational History  . RN    Social History Main Topics  . Smoking status: Never Smoker   . Smokeless tobacco: Not on file  . Alcohol Use: No  . Drug Use: No  . Sexual Activity: Not on file     No problems updated.  OBJECTIVE: HT:5\' 4"  (162.6 cm) WT:138 lb 6.4 oz (62.778 kg) BMI:23.8 BP:(!) 141/87 mmHg HR: bpm TEMP: ( ) RESP:  PHYSICAL EXAM: GENERAL: Adult female. No acute distress PSYCH: Alert and appropriately interactive. SKIN: No open skin lesions or abnormal skin markings on areas inspected as below VASCULAR: radial pulses 2+/4. No significant UE edema NEURO: Upper extremity strength is 5+/5 in all myotomes; sensation is intact to light touch in all dermatomes. LEFT ELBOW: overall well aligned.  No significant deformity.  No, overlying skin changes.  She has marked tenderness palpation over the extensor compartment, moderate tenderness over the lateral epicondylitis.  Grip strength is 5 out of 5.  She does have pain with resisted wrist extension and supination.  Will pulse 2+/4.  Extremity dermatomes intact to light touch.     DATA REVIEWED & OBTAINED:  Limited [facility] ultrasound left elbow marked hypoechoic change within the insertion/origin of the extensor tendon.  There is increased neovascularity.  Birds are thickened.  Extensor tendinopathy lateral condyle, tennis elbow,   ASSESSMENT & PLAN: See  Patient instructions for additional information    ICD-9-CM ICD-10-CM   1. Left elbow pain 719.42 M25.522      Patient would like to try conservative treatment and appropriately center exercises were reviewed today.  Handout provided.  History of migraine headaches will defer nitroglycerin treatment.  Although this was discussed as a potential for the future if failed conservative treatment with eccentric exercises.  Patient was understanding will follow-up for repeat ultrasound in 6 weeks  FOLLOW UP:  Return in about 6 weeks (around 05/11/2015) for repeat ultrasound.

## 2015-04-13 ENCOUNTER — Other Ambulatory Visit: Payer: Self-pay | Admitting: Internal Medicine

## 2015-04-13 MED ORDER — BUTALBITAL-APAP-CAFFEINE 50-325-40 MG PO TABS: 1.0000 | ORAL_TABLET | Freq: Four times a day (QID) | ORAL | Status: DC | PRN

## 2015-04-13 NOTE — Telephone Encounter (Signed)
Fiorcet last refilled by Dr. Annamaria Boots 02/07/15 #75 tabs. Is it okay to refill or does this need to go through PCP? thanks

## 2015-04-13 NOTE — Telephone Encounter (Signed)
Ok to refill as requested 

## 2015-04-13 NOTE — Telephone Encounter (Signed)
RX called in. Nothing further needed

## 2015-04-30 ENCOUNTER — Telehealth: Payer: 59 | Admitting: Family

## 2015-04-30 DIAGNOSIS — J0120 Acute ethmoidal sinusitis, unspecified: Secondary | ICD-10-CM

## 2015-04-30 MED ORDER — FLUTICASONE PROPIONATE 50 MCG/ACT NA SUSP: 2.0000 | Freq: Two times a day (BID) | NASAL | Status: DC

## 2015-04-30 NOTE — Progress Notes (Signed)
We are sorry that you are not feeling well.  Here is how we plan to help!  Based on what you have shared with me it looks like you have sinusitis.  Sinusitis is inflammation and infection in the sinus cavities of the head.  Based on your presentation I believe you most likely have Acute Viral Sinusitis. This is an infection most likely caused by a virus.  There is not specific treatment for viral sinusitis other than to help you with the symptoms until the infection runs it's course.  You may use an oral decongestant such as Mucinex D or if you have glaucoma or high blood pressure use plain Mucinex.  Saline nasal spray help and can safely be used as often as needed for congestion, I have prescribed fluticason nasal spray. Spray two sprays in each nostril twice a day to help reduce your symptoms.  Some authorities believe that zinc sprays or the use of Echinacea may shorten the course of your symptoms.  Also, please do not, under any circumstances, use the Afrin longer than 3 days. It has the high potential to cause rebound nasal congestion and damage to your sinuses after that time.  Sinus infections are not as easily transmitted as other respiratory infection, however we still recommend that you avoid close contact with loved ones, especially the very young and elderly.  Remember to wash your hands thoroughly throughout the day as this is the number one way to prevent the spread of infection!  Home Care:  Only take medications as instructed by your medical team.  Complete the entire course of an antibiotic.  Do not take these medications with alcohol.  A steam or ultrasonic humidifier can help congestion.  You can place a towel over your head and breathe in the steam from hot water coming from a faucet.  Avoid close contacts especially the very young and the elderly.  Cover your mouth when you cough or sneeze.  Always remember to wash your hands.  Get Help Right Away If:  You develop  worsening fever or sinus pain.  You develop a severe head ache or visual changes.  Your symptoms persist after you have completed your treatment plan.  Make sure you  Understand these instructions.  Will watch your condition.  Will get help right away if you are not doing well or get worse.  Your e-visit answers were reviewed by a board certified advanced clinical practitioner to complete your personal care plan.  Depending on the condition, your plan could have included both over the counter or prescription medications.  If there is a problem please reply  once you have received a response from your provider.  Your safety is important to [place].  If you have drug allergies check your prescription carefully.    You can use MyChart to ask questions about today's visit, request a non-urgent call back, or ask for a work or school excuse.  You will get an e-mail in the next two days asking about your experience.  I hope that your e-visit has been valuable and will speed your recovery. Thank you for using e-visits.

## 2015-05-10 ENCOUNTER — Ambulatory Visit: Payer: 59 | Admitting: Sports Medicine

## 2015-05-16 ENCOUNTER — Encounter: Payer: Self-pay | Admitting: Internal Medicine

## 2015-05-16 ENCOUNTER — Ambulatory Visit (INDEPENDENT_AMBULATORY_CARE_PROVIDER_SITE_OTHER): Payer: 59 | Admitting: Internal Medicine

## 2015-05-16 VITALS — BP 120/82 | HR 65 | Temp 97.8°F | Ht 64.0 in | Wt 135.5 lb

## 2015-05-16 DIAGNOSIS — Z0000 Encounter for general adult medical examination without abnormal findings: Secondary | ICD-10-CM

## 2015-05-16 DIAGNOSIS — K828 Other specified diseases of gallbladder: Secondary | ICD-10-CM

## 2015-05-16 MED ORDER — PANTOPRAZOLE SODIUM 40 MG PO TBEC: 40.0000 mg | DELAYED_RELEASE_TABLET | Freq: Two times a day (BID) | ORAL | Status: DC

## 2015-05-16 MED ORDER — DYMISTA 137-50 MCG/ACT NA SUSP: 2.0000 | Freq: Every day | NASAL | Status: DC

## 2015-05-16 NOTE — Assessment & Plan Note (Signed)
December 2014 hospitalization for intense back pain radiating to epigastric region reviewed Associated with transient but significant elevation of LFTs which have since normalized Negative abdominal ultrasound, CT angio and laboratory evaluation: no evidence for PE, aortic aneurysm, acute MI, mass, pancreatitis, acute cholecystitis HIDA negative 10/2013 but recurrent, periodic episodes of epigastric pain radiating to shoulder blade, usually precipitated by meals; never reproducible pain by palpation over abdomen or interscapular Remains on daily to twice daily PPI as per GI suggestion for potential gastritis, but no significant decrease on number of episodes. Continue avoiding NSAIDs - will use tramadol as needed in its place or other pain medications for migraines Given persistent symptoms with classic biliary description, will refer to general surgeon for consideration of potential cholecystectomy

## 2015-05-16 NOTE — Patient Instructions (Addendum)
It was good to see you today.  We have reviewed your prior records including labs and tests today  Health Maintenance reviewed - all recommended immunizations and age-appropriate screenings are up-to-date.  Test(s) ordered today. Return when you are fasting. Your results will be released to [place] (or called to you) after review, usually within 72hours after test completion. If any changes need to be made, you will be notified at that same time.  Medications reviewed and updated, no changes recommended at this time.  we'll make referral to general surgery for opinion about possible cholecystectomy . Our office will contact you regarding appointment(s) once made.  Please schedule followup in 12 months for annual exam and labs, call sooner if problems.  Health Maintenance Adopting a healthy lifestyle and getting preventive care can go a long way to promote health and wellness. Talk with your health care provider about what schedule of regular examinations is right for you. This is a good chance for you to check in with your provider about disease prevention and staying healthy. In between checkups, there are plenty of things you can do on your own. Experts have done a lot of research about which lifestyle changes and preventive measures are most likely to keep you healthy. Ask your health care provider for more information. WEIGHT AND DIET  Eat a healthy diet  Be sure to include plenty of vegetables, fruits, low-fat dairy products, and lean protein.  Do not eat a lot of foods high in solid fats, added sugars, or salt.  Get regular exercise. This is one of the most important things you can do for your health.  Most adults should exercise for at least 150 minutes each week. The exercise should increase your heart rate and make you sweat (moderate-intensity exercise).  Most adults should also do strengthening exercises at least twice a week. This is in addition to the moderate-intensity  exercise.  Maintain a healthy weight  Body mass index (BMI) is a measurement that can be used to identify possible weight problems. It estimates body fat based on height and weight. Your health care provider can help determine your BMI and help you achieve or maintain a healthy weight.  For females 81 years of age and older:   A BMI below 18.5 is considered underweight.  A BMI of 18.5 to 24.9 is normal.  A BMI of 25 to 29.9 is considered overweight.  A BMI of 30 and above is considered obese.  Watch levels of cholesterol and blood lipids  You should start having your blood tested for lipids and cholesterol at 52 years of age, then have this test every 5 years.  You may need to have your cholesterol levels checked more often if:  Your lipid or cholesterol levels are high.  You are older than 52 years of age.  You are at high risk for heart disease.  CANCER SCREENING   Lung Cancer  Lung cancer screening is recommended for adults 73-48 years old who are at high risk for lung cancer because of a history of smoking.  A yearly low-dose CT scan of the lungs is recommended for people who:  Currently smoke.  Have quit within the past 15 years.  Have at least a 30-pack-year history of smoking. A pack year is smoking an average of one pack of cigarettes a day for 1 year.  Yearly screening should continue until it has been 15 years since you quit.  Yearly screening should stop if you develop a  health problem that would prevent you from having lung cancer treatment.  Breast Cancer  Practice breast self-awareness. This means understanding how your breasts normally appear and feel.  It also means doing regular breast self-exams. Let your health care provider know about any changes, no matter how small.  If you are in your 20s or 30s, you should have a clinical breast exam (CBE) by a health care provider every 1-3 years as part of a regular health exam.  If you are 80 or  older, have a CBE every year. Also consider having a breast X-ray (mammogram) every year.  If you have a family history of breast cancer, talk to your health care provider about genetic screening.  If you are at high risk for breast cancer, talk to your health care provider about having an MRI and a mammogram every year.  Breast cancer gene (BRCA) assessment is recommended for women who have family members with BRCA-related cancers. BRCA-related cancers include:  Breast.  Ovarian.  Tubal.  Peritoneal cancers.  Results of the assessment will determine the need for genetic counseling and BRCA1 and BRCA2 testing. Cervical Cancer Routine pelvic examinations to screen for cervical cancer are no longer recommended for nonpregnant women who are considered low risk for cancer of the pelvic organs (ovaries, uterus, and vagina) and who do not have symptoms. A pelvic examination may be necessary if you have symptoms including those associated with pelvic infections. Ask your health care provider if a screening pelvic exam is right for you.   The Pap test is the screening test for cervical cancer for women who are considered at risk.  If you had a hysterectomy for a problem that was not cancer or a condition that could lead to cancer, then you no longer need Pap tests.  If you are older than 65 years, and you have had normal Pap tests for the past 10 years, you no longer need to have Pap tests.  If you have had past treatment for cervical cancer or a condition that could lead to cancer, you need Pap tests and screening for cancer for at least 20 years after your treatment.  If you no longer get a Pap test, assess your risk factors if they change (such as having a new sexual partner). This can affect whether you should start being screened again.  Some women have medical problems that increase their chance of getting cervical cancer. If this is the case for you, your health care provider may  recommend more frequent screening and Pap tests.  The human papillomavirus (HPV) test is another test that may be used for cervical cancer screening. The HPV test looks for the virus that can cause cell changes in the cervix. The cells collected during the Pap test can be tested for HPV.  The HPV test can be used to screen women 67 years of age and older. Getting tested for HPV can extend the interval between normal Pap tests from three to five years.  An HPV test also should be used to screen women of any age who have unclear Pap test results.  After 52 years of age, women should have HPV testing as often as Pap tests.  Colorectal Cancer  This type of cancer can be detected and often prevented.  Routine colorectal cancer screening usually begins at 52 years of age and continues through 52 years of age.  Your health care provider may recommend screening at an earlier age if you have risk factors  for colon cancer.  Your health care provider may also recommend using home test kits to check for hidden blood in the stool.  A small camera at the end of a tube can be used to examine your colon directly (sigmoidoscopy or colonoscopy). This is done to check for the earliest forms of colorectal cancer.  Routine screening usually begins at age 2.  Direct examination of the colon should be repeated every 5-10 years through 52 years of age. However, you may need to be screened more often if early forms of precancerous polyps or small growths are found. Skin Cancer  Check your skin from head to toe regularly.  Tell your health care provider about any new moles or changes in moles, especially if there is a change in a mole's shape or color.  Also tell your health care provider if you have a mole that is larger than the size of a pencil eraser.  Always use sunscreen. Apply sunscreen liberally and repeatedly throughout the day.  Protect yourself by wearing long sleeves, pants, a wide-brimmed hat,  and sunglasses whenever you are outside. HEART DISEASE, DIABETES, AND HIGH BLOOD PRESSURE   Have your blood pressure checked at least every 1-2 years. High blood pressure causes heart disease and increases the risk of stroke.  If you are between 75 years and 75 years old, ask your health care provider if you should take aspirin to prevent strokes.  Have regular diabetes screenings. This involves taking a blood sample to check your fasting blood sugar level.  If you are at a normal weight and have a low risk for diabetes, have this test once every three years after 52 years of age.  If you are overweight and have a high risk for diabetes, consider being tested at a younger age or more often. PREVENTING INFECTION  Hepatitis B  If you have a higher risk for hepatitis B, you should be screened for this virus. You are considered at high risk for hepatitis B if:  You were born in a country where hepatitis B is common. Ask your health care provider which countries are considered high risk.  Your parents were born in a high-risk country, and you have not been immunized against hepatitis B (hepatitis B vaccine).  You have HIV or AIDS.  You use needles to inject street drugs.  You live with someone who has hepatitis B.  You have had sex with someone who has hepatitis B.  You get hemodialysis treatment.  You take certain medicines for conditions, including cancer, organ transplantation, and autoimmune conditions. Hepatitis C  Blood testing is recommended for:  Everyone born from [birth year] through 1963.  Anyone with known risk factors for hepatitis C. Sexually transmitted infections (STIs)  You should be screened for sexually transmitted infections (STIs) including gonorrhea and chlamydia if:  You are sexually active and are younger than 52 years of age.  You are older than 52 years of age and your health care provider tells you that you are at risk for this type of infection.  Your  sexual activity has changed since you were last screened and you are at an increased risk for chlamydia or gonorrhea. Ask your health care provider if you are at risk.  If you do not have HIV, but are at risk, it may be recommended that you take a prescription medicine daily to prevent HIV infection. This is called pre-exposure prophylaxis (PrEP). You are considered at risk if:  You are sexually active and  do not regularly use condoms or know the HIV status of your partner(s).  You take drugs by injection.  You are sexually active with a partner who has HIV. Talk with your health care provider about whether you are at high risk of being infected with HIV. If you choose to begin PrEP, you should first be tested for HIV. You should then be tested every 3 months for as long as you are taking PrEP.  PREGNANCY   If you are premenopausal and you may become pregnant, ask your health care provider about preconception counseling.  If you may become pregnant, take 400 to 800 micrograms (mcg) of folic acid every day.  If you want to prevent pregnancy, talk to your health care provider about birth control (contraception). OSTEOPOROSIS AND MENOPAUSE   Osteoporosis is a disease in which the bones lose minerals and strength with aging. This can result in serious bone fractures. Your risk for osteoporosis can be identified using a bone density scan.  If you are 44 years of age or older, or if you are at risk for osteoporosis and fractures, ask your health care provider if you should be screened.  Ask your health care provider whether you should take a calcium or vitamin D supplement to lower your risk for osteoporosis.  Menopause may have certain physical symptoms and risks.  Hormone replacement therapy may reduce some of these symptoms and risks. Talk to your health care provider about whether hormone replacement therapy is right for you.  HOME CARE INSTRUCTIONS   Schedule regular health, dental, and  eye exams.  Stay current with your immunizations.   Do not use any tobacco products including cigarettes, chewing tobacco, or electronic cigarettes.  If you are pregnant, do not drink alcohol.  If you are breastfeeding, limit how much and how often you drink alcohol.  Limit alcohol intake to no more than 1 drink per day for nonpregnant women. One drink equals 12 ounces of beer, 5 ounces of wine, or 1 ounces of hard liquor.  Do not use street drugs.  Do not share needles.  Ask your health care provider for help if you need support or information about quitting drugs.  Tell your health care provider if you often feel depressed.  Tell your health care provider if you have ever been abused or do not feel safe at home. Document Released: 04/22/2011 Document Revised: 02/21/2014 Document Reviewed: 09/08/2013 [facility] Patient Information 2015 [place], [place]. This information is not intended to replace advice given to you by your health care provider. Make sure you discuss any questions you have with your health care provider.

## 2015-05-16 NOTE — Progress Notes (Signed)
Subjective:    Patient ID: Mindy [name], female    DOB: 01/18/1961, 54 y.o.   MRN: [id number]  HPI  patient is here today for annual physical. Patient feels well today and has no complaints today.  Past Medical History  Diagnosis Date  . Allergic rhinitis   . Migraine   . Elevated transaminase level 12.2014    ?etiology - present 09/2013 hosp for epigastric pain   Family History  Problem Relation Age of Onset  . Hypertension Mother   . Dementia Mother   . Atrial fibrillation Father   . Diabetes type II Father   . Hypertension Father   . Hypertension Brother   . Coronary artery disease Father 15    stent x 2, AoVR   History  Substance Use Topics  . Smoking status: Never Smoker   . Smokeless tobacco: Not on file  . Alcohol Use: No    Review of Systems  Constitutional: Negative for fatigue and unexpected weight change.  Respiratory: Negative for cough, shortness of breath and wheezing.   Cardiovascular: Negative for chest pain, palpitations and leg swelling.  Gastrointestinal: Negative for nausea, vomiting, abdominal pain (occ episodes of epigatric pain with radiation into shoulder blade, exacerbated by meal intale or running), diarrhea, constipation and blood in stool.  Musculoskeletal: Negative for back pain.  Neurological: Negative for dizziness, weakness, light-headedness and headaches.  Psychiatric/Behavioral: Negative for dysphoric mood. The patient is not nervous/anxious.   All other systems reviewed and are negative.      Objective:    Physical Exam  Constitutional: She is oriented to person, place, and time. She appears well-developed and well-nourished. No distress.  HENT:  Head: Normocephalic and atraumatic.  Right Ear: External ear normal.  Left Ear: External ear normal.  Nose: Nose normal.  Mouth/Throat: Oropharynx is clear and moist. No oropharyngeal exudate.  Eyes: EOM are normal. Pupils are equal, round, and reactive to light. Right eye exhibits  no discharge. Left eye exhibits no discharge. No scleral icterus.  Neck: Normal range of motion. Neck supple. No JVD present. No tracheal deviation present. No thyromegaly present.  Cardiovascular: Normal rate, regular rhythm, normal heart sounds and intact distal pulses.  Exam reveals no friction rub.   No murmur heard. Pulmonary/Chest: Effort normal and breath sounds normal. No respiratory distress. She has no wheezes. She has no rales. She exhibits no tenderness.  Abdominal: Soft. Bowel sounds are normal. She exhibits no distension and no mass. There is no tenderness. There is no rebound and no guarding.  Genitourinary:  Defer to gyn  Musculoskeletal: Normal range of motion.  No gross deformities  Lymphadenopathy:    She has no cervical adenopathy.  Neurological: She is alert and oriented to person, place, and time. She has normal reflexes. No cranial nerve deficit.  Skin: Skin is warm and dry. No rash noted. She is not diaphoretic. No erythema.  Psychiatric: She has a normal mood and affect. Her behavior is normal. Judgment and thought content normal.  Nursing note and vitals reviewed.   BP 120/82 mmHg  Pulse 65  Temp(Src) 97.8 F (36.6 C) (Oral)  Ht 5\' 4"  (1.626 m)  Wt 135 lb 8 oz (61.462 kg)  BMI 23.25 kg/m2  SpO2 97% Wt Readings from Last 3 Encounters:  05/16/15 135 lb 8 oz (61.462 kg)  03/30/15 138 lb 6.4 oz (62.778 kg)  03/27/15 138 lb (62.596 kg)    Lab Results  Component Value Date   WBC 3.7* 09/12/2014  HGB 13.8 09/12/2014   HCT 40.3 09/12/2014   PLT 266 09/12/2014   GLUCOSE 84 09/12/2014   CHOL 154 09/20/2013   TRIG 40 09/20/2013   HDL 70 09/20/2013   LDLCALC 76 09/20/2013   ALT 14 09/12/2014   AST 21 09/12/2014   NA 142 09/12/2014   K 4.3 09/12/2014   CL 101 09/12/2014   CREATININE 0.95 09/12/2014   BUN 7 09/12/2014   CO2 29 09/12/2014   TSH 2.20 09/30/2013   INR 1.10 09/20/2013    No results found.     Assessment & Plan:   CPX/z00.00 -  Patient has been counseled on age-appropriate routine health concerns for screening and prevention. These are reviewed and up-to-date. Immunizations are up-to-date or declined. Labs ordered and reviewed.  Problem List Items Addressed This Visit    Biliary dyskinesia    December 2014 hospitalization for intense back pain radiating to epigastric region reviewed Associated with transient but significant elevation of LFTs which have since normalized Negative abdominal ultrasound, CT angio and laboratory evaluation: no evidence for PE, aortic aneurysm, acute MI, mass, pancreatitis, acute cholecystitis HIDA negative 10/2013 but recurrent, periodic episodes of epigastric pain radiating to shoulder blade, usually precipitated by meals; never reproducible pain by palpation over abdomen or interscapular Remains on daily to twice daily PPI as per GI suggestion for potential gastritis, but no significant decrease on number of episodes. Continue avoiding NSAIDs - will use tramadol as needed in its place or other pain medications for migraines Given persistent symptoms with classic biliary description, will refer to general surgeon for consideration of potential cholecystectomy       Relevant Orders   Ambulatory referral to General Surgery    Other Visit Diagnoses    Routine general medical examination at a health care facility    -  Primary    Relevant Orders    Basic metabolic panel    CBC with Differential/Platelet    Hepatic function panel    Lipid panel    TSH    Urinalysis, Routine w reflex microscopic (not at [facility])        Gwendolyn Grant, MD

## 2015-05-16 NOTE — Progress Notes (Signed)
Patient received education resource, including the self-management goal and tool. Patient verbalized understanding. 

## 2015-05-18 ENCOUNTER — Other Ambulatory Visit (INDEPENDENT_AMBULATORY_CARE_PROVIDER_SITE_OTHER): Payer: 59

## 2015-05-18 DIAGNOSIS — Z0000 Encounter for general adult medical examination without abnormal findings: Secondary | ICD-10-CM | POA: Diagnosis not present

## 2015-05-18 LAB — CBC WITH DIFFERENTIAL/PLATELET
Basophils Absolute: 0 10*3/uL (ref 0.0–0.1)
Basophils Relative: 0.5 % (ref 0.0–3.0)
Eosinophils Absolute: 0.1 10*3/uL (ref 0.0–0.7)
Eosinophils Relative: 2.4 % (ref 0.0–5.0)
HCT: 41.9 % (ref 36.0–46.0)
Hemoglobin: 14.3 g/dL (ref 12.0–15.0)
Lymphocytes Relative: 37.6 % (ref 12.0–46.0)
Lymphs Abs: 1.6 10*3/uL (ref 0.7–4.0)
MCHC: 34.1 g/dL (ref 30.0–36.0)
MCV: 93.7 fl (ref 78.0–100.0)
Monocytes Absolute: 0.5 10*3/uL (ref 0.1–1.0)
Monocytes Relative: 11.3 % (ref 3.0–12.0)
Neutro Abs: 2.1 10*3/uL (ref 1.4–7.7)
Neutrophils Relative %: 48.2 % (ref 43.0–77.0)
Platelets: 247 10*3/uL (ref 150.0–400.0)
RBC: 4.47 Mil/uL (ref 3.87–5.11)
RDW: 12.9 % (ref 11.5–15.5)
WBC: 4.4 10*3/uL (ref 4.0–10.5)

## 2015-05-18 LAB — LIPID PANEL
Cholesterol: 224 mg/dL — ABNORMAL HIGH (ref 0–200)
HDL: 72.8 mg/dL (ref >39.00)
LDL Cholesterol: 136 mg/dL — ABNORMAL HIGH (ref 0–99)
NonHDL: 151.33
Total CHOL/HDL Ratio: 3
Triglycerides: 76 mg/dL (ref 0.0–149.0)
VLDL: 15.2 mg/dL (ref 0.0–40.0)

## 2015-05-18 LAB — URINALYSIS, ROUTINE W REFLEX MICROSCOPIC
Bilirubin Urine: NEGATIVE
Hgb urine dipstick: NEGATIVE
Ketones, ur: NEGATIVE
Leukocytes, UA: NEGATIVE
Nitrite: NEGATIVE
RBC / HPF: NONE SEEN (ref 0–?)
Specific Gravity, Urine: 1.01 (ref 1.000–1.030)
Total Protein, Urine: NEGATIVE
Urine Glucose: NEGATIVE
Urobilinogen, UA: 0.2 (ref 0.0–1.0)
WBC, UA: NONE SEEN (ref 0–?)
pH: 6 (ref 5.0–8.0)

## 2015-05-18 LAB — HEPATIC FUNCTION PANEL
ALT: 13 U/L (ref 0–35)
AST: 16 U/L (ref 0–37)
Albumin: 4.3 g/dL (ref 3.5–5.2)
Alkaline Phosphatase: 75 U/L (ref 39–117)
Bilirubin, Direct: 0.1 mg/dL (ref 0.0–0.3)
Total Bilirubin: 0.4 mg/dL (ref 0.2–1.2)
Total Protein: 7.1 g/dL (ref 6.0–8.3)

## 2015-05-18 LAB — BASIC METABOLIC PANEL
BUN: 13 mg/dL (ref 6–23)
CO2: 29 mEq/L (ref 19–32)
Calcium: 9.6 mg/dL (ref 8.4–10.5)
Chloride: 104 mEq/L (ref 96–112)
Creatinine, Ser: 0.9 mg/dL (ref 0.40–1.20)
GFR: 69.89 mL/min (ref >60.00)
Glucose, Bld: 89 mg/dL (ref 70–99)
Potassium: 4.7 mEq/L (ref 3.5–5.1)
Sodium: 140 mEq/L (ref 135–145)

## 2015-05-18 LAB — TSH: TSH: 2.8 u[IU]/mL (ref 0.35–4.50)

## 2015-05-22 ENCOUNTER — Encounter: Payer: Self-pay | Admitting: Sports Medicine

## 2015-05-22 ENCOUNTER — Ambulatory Visit (INDEPENDENT_AMBULATORY_CARE_PROVIDER_SITE_OTHER): Payer: 59 | Admitting: Sports Medicine

## 2015-05-22 VITALS — BP 128/79 | Ht 64.0 in | Wt 138.0 lb

## 2015-05-22 DIAGNOSIS — M25522 Pain in left elbow: Secondary | ICD-10-CM | POA: Diagnosis not present

## 2015-05-22 NOTE — Progress Notes (Signed)
   Subjective:    Patient ID: Mindy [name], female    DOB: 1961/07/10, 55 y.o.   MRN: [id number]  HPI   Patient comes in today for follow-up on left elbow pain. Symptoms have been present since March. Still having pain that she localizes to the lateral aspect of the elbow. If she takes an over-the-counter anti-inflammatory her symptoms are minimal. Her pain is worse with activity. She does get some associated numbness and tingling as well. Recent ultrasound evaluation of the common extensor tendon shows some hypoechoic change consistent with possible tendinopathy year. She has been doing a home exercise program but has not noticed much benefit from it. She denies any swelling.   Review of Systems     Objective:   Physical Exam Well-developed, well-nourished. No acute distress  Left elbow: Full range of motion. No effusion. She has minimal tenderness to palpation over the lateral epicondyle. She is more tender at the radial tunnel which does reproduce paresthesias into the dorsum of her hand. She does have reproducible pain with resisted wrist extension. Good grip strength. Good radial ulnar pulses.       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 Since her symptoms are tolerable with over-the-counter anti-inflammatories I am optimistic that some focused physical therapy will be beneficial. She will follow-up with me in 4 weeks. If symptoms persist I would consider referral to one of the orthopedic hand specialist for their input. She will call me with questions or concerns prior to her follow-up visit.

## 2015-05-29 ENCOUNTER — Other Ambulatory Visit (HOSPITAL_COMMUNITY): Payer: Self-pay | Admitting: Obstetrics and Gynecology

## 2015-05-29 DIAGNOSIS — Z1231 Encounter for screening mammogram for malignant neoplasm of breast: Secondary | ICD-10-CM

## 2015-06-05 ENCOUNTER — Ambulatory Visit (HOSPITAL_COMMUNITY): Payer: 59

## 2015-06-05 ENCOUNTER — Ambulatory Visit (HOSPITAL_COMMUNITY)
Admission: RE | Admit: 2015-06-05 | Discharge: 2015-06-05 | Disposition: A | Discharge status: Home / Self Care | Payer: 59 | Source: Ambulatory Visit | Attending: Obstetrics and Gynecology | Admitting: Obstetrics and Gynecology

## 2015-06-05 DIAGNOSIS — Z1231 Encounter for screening mammogram for malignant neoplasm of breast: Secondary | ICD-10-CM | POA: Diagnosis present

## 2015-06-14 ENCOUNTER — Other Ambulatory Visit: Payer: Self-pay | Admitting: Internal Medicine

## 2015-06-15 NOTE — Telephone Encounter (Signed)
CY please advise on refill request.  Thanks!  

## 2015-06-15 NOTE — Telephone Encounter (Signed)
Ok to refill 

## 2015-06-21 ENCOUNTER — Other Ambulatory Visit: Payer: Self-pay | Admitting: Internal Medicine

## 2015-06-21 ENCOUNTER — Encounter: Payer: Self-pay | Admitting: Internal Medicine

## 2015-06-21 MED ORDER — BUTALBITAL-APAP-CAFFEINE 50-325-40 MG PO TABS: 1.0000 | ORAL_TABLET | Freq: Four times a day (QID) | ORAL | Status: DC | PRN

## 2015-06-21 NOTE — Telephone Encounter (Signed)
Fine to refill Fioricet

## 2015-06-21 NOTE — Telephone Encounter (Signed)
Dr Annamaria Boots please advise on refill  Last given Fiorcet 50-325-40 mg # 75 1 every 6 hours prn pm 04/13/15 with no refills  Last ov 11/04/14 Next ov 03/27/15

## 2015-06-21 NOTE — Telephone Encounter (Signed)
sent a refill request this morning for the Fioricet. i tried to send a refill request last week and MyChart said it was sent bit the request did not go through as far as I can tell because the refill was not submitted. Please lease let me know if there are issues with my request.  Thank you,   Pt last had refill on 04/13/15 Take 1 tablet by mouth every 6 (six) hours as needed for headache. #75 x 0 refills  Please advise Dr. Annamaria Boots thanks

## 2015-06-21 NOTE — Addendum Note (Signed)
Addended by: [name] Rise on: 06/21/2015 03:01 PM   Modules accepted: Orders

## 2015-06-21 NOTE — Telephone Encounter (Signed)
RX called in and pt email sent to pt

## 2015-06-27 ENCOUNTER — Ambulatory Visit (INDEPENDENT_AMBULATORY_CARE_PROVIDER_SITE_OTHER): Payer: 59 | Admitting: Sports Medicine

## 2015-06-27 ENCOUNTER — Encounter: Payer: Self-pay | Admitting: Sports Medicine

## 2015-06-27 VITALS — BP 118/81 | Ht 64.0 in | Wt 138.0 lb

## 2015-06-27 DIAGNOSIS — M7712 Lateral epicondylitis, left elbow: Secondary | ICD-10-CM | POA: Diagnosis not present

## 2015-06-27 DIAGNOSIS — M5412 Radiculopathy, cervical region: Secondary | ICD-10-CM | POA: Diagnosis not present

## 2015-06-27 MED ORDER — PREDNISONE 10 MG PO TABS
ORAL_TABLET | ORAL | Status: DC
Start: 2015-06-27 — End: 2015-09-21

## 2015-06-27 NOTE — Assessment & Plan Note (Signed)
Patient's lateral epicondylitis appears to have been improving (slowly) w/ current treatment regimen. Unfortunately we have currently been met w/ a setback. We have asked that she take a 2 wk vacation from PT at this time. We will reevaluate in 2 weeks at f/u appt.

## 2015-06-27 NOTE — Progress Notes (Addendum)
   HPI  CC: Lt elbow pain, Lt Arm numbness Patient is here for f/u on her Lt elbow pain. Patient has been receiving treatment at physical therapy since her last visit. She reports some improvement over the past week or 2. Unfortunately, this Saturday she experienced a setback when a friend jostled her. Since that time she has been experiencing pain, numbness, and tingling down her Lt arm and neck. Symptoms experienced cross myotomes. Of note: patient is s/p cervical fusion.   ROS: Positive for HA. Neg for dizziness, vision changes, fever, chills, edema, weakness  Objective: BP 118/81 mmHg  Ht _0  (1.626 m)  Wt 138 lb (62.596 kg)  BMI 23.68 kg/m2 Gen: NAD, alert, cooperative MSK: Inspection reveals no abnormalities, atrophy or asymmetry. TTP of lateral epicondyle as well as the brachioradialis, and the soft tissue of the extensors distally. ROM is full in all planes. Rotator cuff strength normal throughout. Speeds test normal. Yergason's normal but w/ tenderness over lateral epicondyle. Normal scapular function observed. Lt SCM TTP.  Numbness and tingling reported in 1st, 4th, and 5th digits of Lt UE. +3 reflexes of bilateral UE except for Lt brachioradialis (+2). Pulses intact throughout.  Neuro: Alert and oriented, Speech clear, No gross deficits.  Assessment and plan:  Lateral epicondylitis of left elbow Patient's lateral epicondylitis appears to have been improving (slowly) w/ current treatment regimen. Unfortunately we have currently been met w/ a setback. We have asked that she take a 2 wk vacation from PT at this time. We will reevaluate in 2 weeks at f/u appt.  Cervical radiculitis Patient is S/p cervical fusion (see on XR from '09). Patient likely suffered an event which has caused inflammation to this sight. Her sxs are c/w multiple nerve irritation crossing multiple dermotomes/myotomes. No new weakness appreciated. - Prednisone dose pack prescribed - F/u in 2 wks   Meds  ordered this encounter  Medications  . predniSONE (DELTASONE) 10 MG tablet    Sig: Take as directed    Dispense:  21 tablet    Refill:  0     Elberta Leatherwood, MD,MS,  PGY2 06/27/2015 11:25 AM   Patient seen and evaluated with the above-named resident. I agree with the plan of care. Unfortunately the patient has suffered an acute episode of left arm cervical radiculopathy. I will put her on a 6 day Sterapred Dosepak in hopes of calming down her symptoms. Her lateral epicondylitis is still present but appears to be improving. This  may in fact the radial tunnel syndrome as she does have pain directly over the radial tunnel as well. It is certainly a mixed picture. Follow-up with me in 2 weeks for reevaluation of both her cervical radiculopathy and her left elbow lateral epicondylitis. In the meantime, she will hold on formal therapy for her lateral epicondylitis.

## 2015-06-27 NOTE — Patient Instructions (Signed)
It was a pleasure seeing you today in our clinic. Today we discussed your elbow and arm pain. Here is the treatment plan we have discussed and agreed upon together:   - Today we have prescribed a Prednisone dose pack for you to take over the next few days. This medication course is not the same each day so we have provided a handout with how to take this medication. Our hope is to calm down much of the inflammation that is causing you discomfort. - We would like to see you back in 2 weeks to reassess this pain/numbness/tingling.  - If you have questions do not hesitate to call.

## 2015-06-27 NOTE — Assessment & Plan Note (Signed)
Patient is S/p cervical fusion (see on XR from '09). Patient likely suffered an event which has caused inflammation to this sight. Her sxs are c/w multiple nerve irritation crossing multiple dermotomes/myotomes. No new weakness appreciated. - Prednisone dose pack prescribed - F/u in 2 wks

## 2015-07-03 ENCOUNTER — Other Ambulatory Visit: Payer: Self-pay | Admitting: Surgery

## 2015-07-11 ENCOUNTER — Ambulatory Visit: Payer: 59 | Admitting: Sports Medicine

## 2015-07-17 ENCOUNTER — Encounter: Payer: Self-pay | Admitting: Sports Medicine

## 2015-07-17 ENCOUNTER — Ambulatory Visit (INDEPENDENT_AMBULATORY_CARE_PROVIDER_SITE_OTHER): Payer: 59 | Admitting: Sports Medicine

## 2015-07-17 VITALS — BP 125/78 | HR 70 | Ht 64.0 in | Wt 138.0 lb

## 2015-07-17 DIAGNOSIS — M25522 Pain in left elbow: Secondary | ICD-10-CM

## 2015-07-17 NOTE — Progress Notes (Signed)
   Subjective:    Patient ID: Mindy [name], female    DOB: 12/02/1960, 54 y.o.   MRN: [id number]  HPI  chief complaint: Left elbow pain  Patient comes in today for follow-up on left elbow pain. 6 day steroid Dosepak helped her pain tremendously. However her pain is beginning to return. Primarily along the lateral elbow. She was doing some physical therapy but we decided to hold on that until today's visit.    Review of Systems     Objective:   Physical Exam Well-developed, well-nourished. No acute distress. Awake alert and oriented 3. Vital signs reviewed.  Left elbow: Full range of motion. No effusion. No soft tissue swelling. She is tender to palpation over the lateral epicondyle with reproducible pain with ECRB testing. There is still some tenderness to palpation over the radial tunnel as well. Good strength. Good stability. Skin is intact. Neurovascularly intact distally.       Assessment & Plan:  Improved left elbow pain secondary to lateral epicondylitis versus radial tunnel syndrome  I discussed a return to physical therapy but we will hold on that for now. She will instead do her home exercises. She also gets good pain relief with Aleve so she can continue with this as needed. I've asked her to contact me in 3 or 4 weeks via telephone or via email for an update on her progress. If symptoms continue to worsen and I would consider a return to physical therapy prior to referral to one of the hand specialists.

## 2015-08-17 ENCOUNTER — Other Ambulatory Visit: Payer: Self-pay | Admitting: Internal Medicine

## 2015-08-17 NOTE — Telephone Encounter (Signed)
PRN refills given by PCP on 7.26.16

## 2015-08-20 ENCOUNTER — Encounter: Payer: Self-pay | Admitting: Internal Medicine

## 2015-08-21 ENCOUNTER — Other Ambulatory Visit: Payer: Self-pay | Admitting: *Deleted

## 2015-08-21 MED ORDER — DYMISTA 137-50 MCG/ACT NA SUSP: 2.0000 | Freq: Every day | NASAL | Status: DC

## 2015-09-08 ENCOUNTER — Other Ambulatory Visit: Payer: Self-pay | Admitting: Internal Medicine

## 2015-09-08 MED ORDER — BUTALBITAL-APAP-CAFFEINE 50-325-40 MG PO TABS: 1.0000 | ORAL_TABLET | Freq: Four times a day (QID) | ORAL | Status: DC | PRN

## 2015-09-08 NOTE — Telephone Encounter (Signed)
Ok to refill 

## 2015-09-08 NOTE — Telephone Encounter (Signed)
RX has been called in and email sent to pt.

## 2015-09-08 NOTE — Telephone Encounter (Signed)
Pt sent in request for fiorcet Last refilled 06/21/15 #75 x 0 refills Take 1 tablet by mouth every 6 (six) hours as needed for headache  Please advise Dr. Annamaria Boots thanks

## 2015-09-12 ENCOUNTER — Encounter (HOSPITAL_COMMUNITY)
Admission: RE | Admit: 2015-09-12 | Discharge: 2015-09-12 | Disposition: A | Discharge status: Home / Self Care | Payer: 59 | Source: Ambulatory Visit | Attending: Surgery | Admitting: Surgery

## 2015-09-12 ENCOUNTER — Encounter (HOSPITAL_COMMUNITY): Payer: Self-pay

## 2015-09-12 DIAGNOSIS — K828 Other specified diseases of gallbladder: Secondary | ICD-10-CM | POA: Diagnosis not present

## 2015-09-12 DIAGNOSIS — Z01812 Encounter for preprocedural laboratory examination: Secondary | ICD-10-CM | POA: Diagnosis not present

## 2015-09-12 HISTORY — DX: Adverse effect of unspecified anesthetic, initial encounter: T41.45XA

## 2015-09-12 HISTORY — DX: Gastro-esophageal reflux disease without esophagitis: K21.9

## 2015-09-12 HISTORY — DX: Other specified postprocedural states: R11.2

## 2015-09-12 HISTORY — DX: Other specified postprocedural states: Z98.890

## 2015-09-12 HISTORY — DX: Other complications of anesthesia, initial encounter: T88.59XA

## 2015-09-12 LAB — BASIC METABOLIC PANEL
Anion gap: 8 (ref 5–15)
BUN: 8 mg/dL (ref 6–20)
CO2: 27 mmol/L (ref 22–32)
Calcium: 9.3 mg/dL (ref 8.9–10.3)
Chloride: 104 mmol/L (ref 101–111)
Creatinine, Ser: 0.86 mg/dL (ref 0.44–1.00)
GFR calc Af Amer: >60 mL/min (ref >60)
GFR calc non Af Amer: >60 mL/min (ref >60)
Glucose, Bld: 89 mg/dL (ref 65–99)
Potassium: 4.1 mmol/L (ref 3.5–5.1)
Sodium: 139 mmol/L (ref 135–145)

## 2015-09-12 LAB — CBC
HCT: 40.3 % (ref 36.0–46.0)
Hemoglobin: 13.8 g/dL (ref 12.0–15.0)
MCH: 31.5 pg (ref 26.0–34.0)
MCHC: 34.2 g/dL (ref 30.0–36.0)
MCV: 92 fL (ref 78.0–100.0)
Platelets: 258 10*3/uL (ref 150–400)
RBC: 4.38 MIL/uL (ref 3.87–5.11)
RDW: 12.4 % (ref 11.5–15.5)
WBC: 6.1 10*3/uL (ref 4.0–10.5)

## 2015-09-12 NOTE — Pre-Procedure Instructions (Addendum)
Mindy [name]  09/12/2015      [facility] - [place], [place] - [address] [place] [address] Phone: [phone] Fax: [phone]  [id number] - [place], [place] [address] Phone: [phone] Fax: [phone]    Your procedure is scheduled on 09/21/15  Report to [facility] short stay admitting at 530 A.M.  Call this number if you have problems the morning of surgery:  [phone]   Remember:  Do not eat food or drink liquids after midnight.  Take these medicines the morning of surgery with A SIP OF WATER tylenol,fioricet if needed, dexilant   STOP all herbel meds, nsaids (aleve,naproxen,advil,ibuprofen) 5 days prior to surgery starting 09/16/15 including vitamins(D) ,aspirin  STOP sudafed now   Do not wear jewelry, make-up or nail polish.  Do not wear lotions, powders, or perfumes.  You may wear deodorant.  Do not shave 48 hours prior to surgery.  Men may shave face and neck.  Do not bring valuables to the hospital.  [facility] is not responsible for any belongings or valuables.  Contacts, dentures or bridgework may not be worn into surgery.  Leave your suitcase in the car.  After surgery it may be brought to your room.  For patients admitted to the hospital, discharge time will be determined by your treatment team.  Patients discharged the day of surgery will not be allowed to drive home.   Name and phone number of your driver:    Special instructions:  Special Instructions: [facility] - Preparing for Surgery  Before surgery, you can play an important role.  Because skin is not sterile, your skin needs to be as free of germs as possible.  You can reduce the number of germs on you skin by washing with CHG (chlorahexidine gluconate) soap before surgery.  CHG is an antiseptic cleaner which kills germs and bonds with the skin to continue killing germs  even after washing.  Please DO NOT use if you have an allergy to CHG or antibacterial soaps.  If your skin becomes reddened/irritated stop using the CHG and inform your nurse when you arrive at Short Stay.  Do not shave (including legs and underarms) for at least 48 hours prior to the first CHG shower.  You may shave your face.  Please follow these instructions carefully:   1.  Shower with CHG Soap the night before surgery and the morning of Surgery.  2.  If you choose to wash your hair, wash your hair first as usual with your normal shampoo.  3.  After you shampoo, rinse your hair and body thoroughly to remove the Shampoo.  4.  Use CHG as you would any other liquid soap.  You can apply chg directly  to the skin and wash gently with scrungie or a clean washcloth.  5.  Apply the CHG Soap to your body ONLY FROM THE NECK DOWN.  Do not use on open wounds or open sores.  Avoid contact with your eyes ears, mouth and genitals (private parts).  Wash genitals (private parts)       with your normal soap.  6.  Wash thoroughly, paying special attention to the area where your surgery will be performed.  7.  Thoroughly rinse your body with warm water from the neck down.  8.  DO NOT shower/wash with your normal soap after using and rinsing off the CHG Soap.  9.  Pat yourself dry with a clean towel.            10.  Wear clean pajamas.            11.  Place clean sheets on your bed the night of your first shower and do not sleep with pets.  Day of Surgery  Do not apply any lotions/deodorants the morning of surgery.  Please wear clean clothes to the hospital/surgery center.  Please read over the following fact sheets that you were given. Pain Booklet, Coughing and Deep Breathing and Surgical Site Infection Prevention

## 2015-09-20 MED ORDER — CEFAZOLIN SODIUM-DEXTROSE 2-3 GM-% IV SOLR
2.0000 g | INTRAVENOUS | Status: AC
  Administered 2015-09-21: 2 g via INTRAVENOUS
  Filled 2015-09-20: qty 50

## 2015-09-20 NOTE — H&P (Signed)
  Mindy [name]  Location: [facility] Patient #: [id number] DOB: Mar 09, 1961 Divorced / Language: Cleophus Molt / Race: White Female   History of Present Illness (Oskar Cretella A. Ninfa Linden MD;  Patient words: New-gallbladder.  The patient is a 54 year old female who presents with abdominal pain. I am seeing her as a second opinion from Dr. Collene Mares. Again, for years she has been having epigastric abdominal pain hurting through to the back and nausea after fatty meals. Occasionally the pain only in the right upper quadrant. She had a HIDA scan which showed a 40% ejection fraction but she had symptoms with ensure ingestion. She has had a normal CT and ultrasound. She has had elevated liver function tests in the past   Allergies Malachi Bonds, CMA;  Oxaprozin *ANALGESICS - ANTI-INFLAMMATORY*  Medication History Malachi Bonds, CMA; Dexilant (60MG  Capsule DR, Oral) Active. Linzess (145MCG Capsule, Oral) Active. Tylenol (325MG  Tablet, Oral) Active. Fioricet/Codeine (50-300-40-30MG  Capsule, Oral) Active. Oscal 500/200 D-3 (500-200MG -UNIT Tablet, Oral) Active. Dymista (137-50MCG/ACT Suspension, Nasal) Active. Premarin (0.625MG  Tablet, Oral) Active. Medications Reconciled  Vitals (Chemira Jones CMA; 07/03/2015 1:59 PM)  Weight: 135.2 lb Height: 64in Body Surface Area: 1.66 m Body Mass Index: 23.21 kg/m  Temp.: 97.74F(Oral)  Pulse: 54 (Regular)  BP: 102/68 (Sitting, Left Arm, Standard)     Physical Exam (Dessirae Scarola A. Ninfa Linden MD;  The physical exam findings are as follows: Note:On exam, she does have epigastric and right upper quadrant tenderness with some guarding Lungs clear CV RRR Skin without rash Neuro normal   Assessment & Plan (Sonny Poth A. Ninfa Linden MD;  BILIARY DYSKINESIA (K82.8) Impression: I had a long discussion with the patient. I believe she may have some dyskinesia and potentially chronic cholecystitis. She will be getting an upper endoscopy by  Dr. Collene Mares in a few weeks. We will then plan on proceeding with a laparoscopic cholecystectomy. I discussed this with her. I discussed all the risks. I also discussed the risk this may not resolve her symptoms. She understands and wished to proceed with surgery after endoscopy

## 2015-09-21 ENCOUNTER — Encounter (HOSPITAL_COMMUNITY): Payer: Self-pay | Admitting: Certified Registered Nurse Anesthetist

## 2015-09-21 ENCOUNTER — Ambulatory Visit (HOSPITAL_COMMUNITY)
Admission: RE | Admit: 2015-09-21 | Discharge: 2015-09-21 | Disposition: A | Discharge status: Home / Self Care | Payer: 59 | Source: Ambulatory Visit | Attending: Surgery | Admitting: Surgery

## 2015-09-21 ENCOUNTER — Encounter (HOSPITAL_COMMUNITY)
Admission: RE | Disposition: A | Discharge status: Home / Self Care | Payer: Self-pay | Source: Ambulatory Visit | Attending: Surgery

## 2015-09-21 ENCOUNTER — Ambulatory Visit (HOSPITAL_COMMUNITY): Payer: 59 | Admitting: Certified Registered Nurse Anesthetist

## 2015-09-21 DIAGNOSIS — Z90710 Acquired absence of both cervix and uterus: Secondary | ICD-10-CM | POA: Insufficient documentation

## 2015-09-21 DIAGNOSIS — K219 Gastro-esophageal reflux disease without esophagitis: Secondary | ICD-10-CM | POA: Insufficient documentation

## 2015-09-21 DIAGNOSIS — K828 Other specified diseases of gallbladder: Secondary | ICD-10-CM | POA: Diagnosis present

## 2015-09-21 DIAGNOSIS — K811 Chronic cholecystitis: Secondary | ICD-10-CM | POA: Insufficient documentation

## 2015-09-21 DIAGNOSIS — R1013 Epigastric pain: Secondary | ICD-10-CM | POA: Diagnosis present

## 2015-09-21 HISTORY — PX: CHOLECYSTECTOMY: SHX55

## 2015-09-21 SURGERY — LAPAROSCOPIC CHOLECYSTECTOMY
Anesthesia: General | Site: Abdomen

## 2015-09-21 MED ORDER — SODIUM CHLORIDE 0.9 % IR SOLN
Status: DC | PRN
  Administered 2015-09-21: 1000 mL

## 2015-09-21 MED ORDER — OXYCODONE HCL 5 MG/5ML PO SOLN: 5.0000 mg | Freq: Once | ORAL | Status: DC | PRN

## 2015-09-21 MED ORDER — LIDOCAINE HCL (CARDIAC) 20 MG/ML IV SOLN
INTRAVENOUS | Status: DC | PRN
  Administered 2015-09-21: 50 mg via INTRAVENOUS

## 2015-09-21 MED ORDER — SUGAMMADEX SODIUM 200 MG/2ML IV SOLN
INTRAVENOUS | Status: AC
  Filled 2015-09-21: qty 2

## 2015-09-21 MED ORDER — ROCURONIUM BROMIDE 100 MG/10ML IV SOLN
INTRAVENOUS | Status: DC | PRN
  Administered 2015-09-21: 40 mg via INTRAVENOUS

## 2015-09-21 MED ORDER — EPHEDRINE SULFATE 50 MG/ML IJ SOLN
INTRAMUSCULAR | Status: DC | PRN
  Administered 2015-09-21: 5 mg via INTRAVENOUS

## 2015-09-21 MED ORDER — ONDANSETRON HCL 4 MG/2ML IJ SOLN: 4.0000 mg | Freq: Once | INTRAMUSCULAR | Status: DC | PRN

## 2015-09-21 MED ORDER — SCOPOLAMINE 1 MG/3DAYS TD PT72
MEDICATED_PATCH | TRANSDERMAL | Status: DC | PRN
  Administered 2015-09-21: 1 via TRANSDERMAL

## 2015-09-21 MED ORDER — 0.9 % SODIUM CHLORIDE (POUR BTL) OPTIME
TOPICAL | Status: DC | PRN
  Administered 2015-09-21: 1000 mL

## 2015-09-21 MED ORDER — HYDROCODONE-ACETAMINOPHEN 5-325 MG PO TABS: 1.0000 | ORAL_TABLET | Freq: Four times a day (QID) | ORAL | Status: DC | PRN

## 2015-09-21 MED ORDER — SUGAMMADEX SODIUM 200 MG/2ML IV SOLN
INTRAVENOUS | Status: DC | PRN
  Administered 2015-09-21: 125 mg via INTRAVENOUS

## 2015-09-21 MED ORDER — DEXAMETHASONE SODIUM PHOSPHATE 10 MG/ML IJ SOLN
INTRAMUSCULAR | Status: DC | PRN
  Administered 2015-09-21: 10 mg via INTRAVENOUS

## 2015-09-21 MED ORDER — FENTANYL CITRATE (PF) 250 MCG/5ML IJ SOLN
INTRAMUSCULAR | Status: AC
  Filled 2015-09-21: qty 5

## 2015-09-21 MED ORDER — PROPOFOL 10 MG/ML IV BOLUS
INTRAVENOUS | Status: DC | PRN
  Administered 2015-09-21: 180 mg via INTRAVENOUS

## 2015-09-21 MED ORDER — OXYCODONE HCL 5 MG PO TABS: 5.0000 mg | ORAL_TABLET | Freq: Once | ORAL | Status: DC | PRN

## 2015-09-21 MED ORDER — MIDAZOLAM HCL 2 MG/2ML IJ SOLN
INTRAMUSCULAR | Status: AC
  Filled 2015-09-21: qty 2

## 2015-09-21 MED ORDER — BUPIVACAINE-EPINEPHRINE 0.25% -1:200000 IJ SOLN
INTRAMUSCULAR | Status: DC | PRN
  Administered 2015-09-21: 20 mL

## 2015-09-21 MED ORDER — PROPOFOL 10 MG/ML IV BOLUS
INTRAVENOUS | Status: AC
  Filled 2015-09-21: qty 20

## 2015-09-21 MED ORDER — MIDAZOLAM HCL 5 MG/5ML IJ SOLN
INTRAMUSCULAR | Status: DC | PRN
  Administered 2015-09-21: 2 mg via INTRAVENOUS

## 2015-09-21 MED ORDER — HYDROMORPHONE HCL 1 MG/ML IJ SOLN
INTRAMUSCULAR | Status: AC
  Filled 2015-09-21: qty 1

## 2015-09-21 MED ORDER — BUPIVACAINE-EPINEPHRINE (PF) 0.25% -1:200000 IJ SOLN
INTRAMUSCULAR | Status: AC
  Filled 2015-09-21: qty 30

## 2015-09-21 MED ORDER — ONDANSETRON HCL 4 MG PO TABS: 4.0000 mg | ORAL_TABLET | Freq: Three times a day (TID) | ORAL | Status: DC | PRN

## 2015-09-21 MED ORDER — HYDROMORPHONE HCL 1 MG/ML IJ SOLN
0.2500 mg | INTRAMUSCULAR | Status: DC | PRN
Start: 2015-09-21 — End: 2015-09-21
  Administered 2015-09-21 (×2): 0.25 mg via INTRAVENOUS

## 2015-09-21 MED ORDER — LACTATED RINGERS IV SOLN
INTRAVENOUS | Status: DC | PRN
  Administered 2015-09-21: 07:00:00 via INTRAVENOUS

## 2015-09-21 MED ORDER — ONDANSETRON HCL 4 MG/2ML IJ SOLN
INTRAMUSCULAR | Status: DC | PRN
  Administered 2015-09-21: 4 mg via INTRAVENOUS

## 2015-09-21 MED ORDER — FENTANYL CITRATE (PF) 100 MCG/2ML IJ SOLN
INTRAMUSCULAR | Status: DC | PRN
  Administered 2015-09-21 (×2): 100 ug via INTRAVENOUS
  Administered 2015-09-21: 50 ug via INTRAVENOUS

## 2015-09-21 SURGICAL SUPPLY — 36 items
APPLIER CLIP 5 13 M/L LIGAMAX5 (MISCELLANEOUS) ×2
APR CLP MED LRG 5 ANG JAW (MISCELLANEOUS) ×1
BAG SPEC RTRVL LRG 6X4 10 (ENDOMECHANICALS) ×1
CANISTER SUCTION 2500CC (MISCELLANEOUS) ×2 IMPLANT
CHLORAPREP W/TINT 26ML (MISCELLANEOUS) ×2 IMPLANT
CLIP APPLIE 5 13 M/L LIGAMAX5 (MISCELLANEOUS) ×1 IMPLANT
COVER SURGICAL LIGHT HANDLE (MISCELLANEOUS) ×2 IMPLANT
ELECT REM PT RETURN 9FT ADLT (ELECTROSURGICAL) ×2
ELECTRODE REM PT RTRN 9FT ADLT (ELECTROSURGICAL) ×1 IMPLANT
GLOVE BIO SURGEON STRL SZ8.5 (GLOVE) ×1 IMPLANT
GLOVE BIOGEL PI IND STRL 8.5 (GLOVE) IMPLANT
GLOVE BIOGEL PI INDICATOR 8.5 (GLOVE) ×1
GLOVE SURG SIGNA 7.5 PF LTX (GLOVE) ×2 IMPLANT
GLOVE SURG SS PI 7.5 STRL IVOR (GLOVE) ×2 IMPLANT
GOWN STRL REUS W/ TWL LRG LVL3 (GOWN DISPOSABLE) ×2 IMPLANT
GOWN STRL REUS W/ TWL XL LVL3 (GOWN DISPOSABLE) ×1 IMPLANT
GOWN STRL REUS W/TWL LRG LVL3 (GOWN DISPOSABLE) ×4
GOWN STRL REUS W/TWL XL LVL3 (GOWN DISPOSABLE) ×2
KIT BASIN OR (CUSTOM PROCEDURE TRAY) ×2 IMPLANT
KIT ROOM TURNOVER OR (KITS) ×2 IMPLANT
LIQUID BAND (GAUZE/BANDAGES/DRESSINGS) ×2 IMPLANT
NS IRRIG 1000ML POUR BTL (IV SOLUTION) ×2 IMPLANT
PAD ARMBOARD 7.5X6 YLW CONV (MISCELLANEOUS) ×2 IMPLANT
POUCH SPECIMEN RETRIEVAL 10MM (ENDOMECHANICALS) ×2 IMPLANT
SCISSORS LAP 5X35 DISP (ENDOMECHANICALS) ×2 IMPLANT
SET IRRIG TUBING LAPAROSCOPIC (IRRIGATION / IRRIGATOR) ×2 IMPLANT
SLEEVE ENDOPATH XCEL 5M (ENDOMECHANICALS) ×4 IMPLANT
SPECIMEN JAR SMALL (MISCELLANEOUS) ×2 IMPLANT
SUT MON AB 4-0 PC3 18 (SUTURE) ×2 IMPLANT
SYR CONTROL 10ML LL (SYRINGE) ×1 IMPLANT
TOWEL OR 17X24 6PK STRL BLUE (TOWEL DISPOSABLE) ×2 IMPLANT
TOWEL OR 17X26 10 PK STRL BLUE (TOWEL DISPOSABLE) ×2 IMPLANT
TRAY LAPAROSCOPIC MC (CUSTOM PROCEDURE TRAY) ×2 IMPLANT
TROCAR XCEL BLUNT TIP 100MML (ENDOMECHANICALS) ×2 IMPLANT
TROCAR XCEL NON-BLD 5MMX100MML (ENDOMECHANICALS) ×2 IMPLANT
TUBING INSUFFLATION (TUBING) ×2 IMPLANT

## 2015-09-21 NOTE — Anesthesia Procedure Notes (Signed)
Procedure Name: Intubation Date/Time: 09/21/2015 7:38 AM Performed by: Shirlyn Goltz Pre-anesthesia Checklist: Patient identified, Emergency Drugs available, Suction available and Patient being monitored Patient Re-evaluated:Patient Re-evaluated prior to inductionOxygen Delivery Method: Circle system utilized Preoxygenation: Pre-oxygenation with 100% oxygen Intubation Type: IV induction Ventilation: Mask ventilation without difficulty Laryngoscope Size: Mac and 3 Grade View: Grade III Tube type: Oral Tube size: 7.0 mm Airway Equipment and Method: Stylet and Bougie stylet Placement Confirmation: ETT inserted through vocal cords under direct vision,  positive ETCO2 and breath sounds checked- equal and bilateral Secured at: 22 cm Tube secured with: Tape Dental Injury: Teeth and Oropharynx as per pre-operative assessment

## 2015-09-21 NOTE — Transfer of Care (Signed)
Immediate Anesthesia Transfer of Care Note  Patient: Mindy [name]  Procedure(s) Performed: Procedure(s): LAPAROSCOPIC CHOLECYSTECTOMY (N/A)  Patient Location: PACU  Anesthesia Type:General  Level of Consciousness: awake, alert  and oriented  Airway & Oxygen Therapy: Patient Spontanous Breathing and Patient connected to nasal cannula oxygen  Post-op Assessment: Report given to RN, Post -op Vital signs reviewed and stable, Patient moving all extremities and Patient moving all extremities X 4  Post vital signs: Reviewed and stable  Last Vitals:  Filed Vitals:   09/21/15 0605  BP: 125/66  Pulse: 62  Temp: 36.5 C  Resp: 16    Complications: No apparent anesthesia complications

## 2015-09-21 NOTE — Progress Notes (Signed)
Report to Darcella Gasman RN

## 2015-09-21 NOTE — Interval H&P Note (Signed)
History and Physical Interval Note: no change in H and P  09/21/2015 6:53 AM  Mindy [name]  has presented today for surgery, with the diagnosis of Biliary Dyskenesia  The various methods of treatment have been discussed with the patient and family. After consideration of risks, benefits and other options for treatment, the patient has consented to  Procedure(s): LAPAROSCOPIC CHOLECYSTECTOMY (N/A) as a surgical intervention .  The patient's history has been reviewed, patient examined, no change in status, stable for surgery.  I have reviewed the patient's chart and labs.  Questions were answered to the patient's satisfaction.     [name] A

## 2015-09-21 NOTE — Discharge Instructions (Signed)
[facility] ______[facility] LAPAROSCOPIC SURGERY: POST OP INSTRUCTIONS Always review your discharge instruction sheet given to you by the facility where your surgery was performed. IF YOU HAVE DISABILITY OR FAMILY LEAVE FORMS, YOU MUST BRING THEM TO THE OFFICE FOR PROCESSING.   DO NOT GIVE THEM TO YOUR DOCTOR.  1. A prescription for pain medication may be given to you upon discharge.  Take your pain medication as prescribed, if needed.  If narcotic pain medicine is not needed, then you may take acetaminophen (Tylenol) or ibuprofen (Advil) as needed. 2. Take your usually prescribed medications unless otherwise directed. 3. If you need a refill on your pain medication, please contact your pharmacy.  They will contact our office to request authorization. Prescriptions will not be filled after 5pm or on week-ends. 4. You should follow a light diet the first few days after arrival home, such as soup and crackers, etc.  Be sure to include lots of fluids daily. 5. Most patients will experience some swelling and bruising in the area of the incisions.  Ice packs will help.  Swelling and bruising can take several days to resolve.  6. It is common to experience some constipation if taking pain medication after surgery.  Increasing fluid intake and taking a stool softener (such as Colace) will usually help or prevent this problem from occurring.  A mild laxative (Milk of Magnesia or Miralax) should be taken according to package instructions if there are no bowel movements after 48 hours. 7. Unless discharge instructions indicate otherwise, you may remove your bandages 24-48 hours after surgery, and you may shower at that time.  You may have steri-strips (small skin tapes) in place directly over the incision.  These strips should be left on the skin for 7-10 days.  If your surgeon used skin glue on the incision, you may shower in 24 hours.  The glue will flake off over the next 2-3 weeks.  Any sutures or  staples will be removed at the office during your follow-up visit. 8. ACTIVITIES:  You may resume regular (light) daily activities beginning the next day--such as daily self-care, walking, climbing stairs--gradually increasing activities as tolerated.  You may have sexual intercourse when it is comfortable.  Refrain from any heavy lifting or straining until approved by your doctor. a. You may drive when you are no longer taking prescription pain medication, you can comfortably wear a seatbelt, and you can safely maneuver your car and apply brakes. b. RETURN TO WORK:  __________________________________________________________ 9. You should see your doctor in the office for a follow-up appointment approximately 2-3 weeks after your surgery.  Make sure that you call for this appointment within a day or two after you arrive home to insure a convenient appointment time. 10. OTHER INSTRUCTIONS: _NO LIFTING MORE THAN 15 TO 20 POUNDS FOR 2 WEEKS 11. ICE PACK AND IBUPROFEN ALSO FOR PAIN 12. _________________________________________________________________________________________________________________________ __________________________________________________________________________________________________________________________ WHEN TO CALL YOUR DOCTOR: 1. Fever over 101.0 2. Inability to urinate 3. Continued bleeding from incision. 4. Increased pain, redness, or drainage from the incision. 5. Increasing abdominal pain  The clinic staff is available to answer your questions during regular business hours.  Please dont hesitate to call and ask to speak to one of the nurses for clinical concerns.  If you have a medical emergency, go to the nearest emergency room or call 911.  A surgeon from [facility] is always on call at the hospital. [address] [place], [place], [place]  [address] ? [address], [place], [place]   [address] [phone] ? [phone] ? FAX [phone] Web  site: [email or web address]

## 2015-09-21 NOTE — Op Note (Signed)
Laparoscopic Cholecystectomy Procedure Note  Indications: This patient presents with symptomatic gallbladder disease and will undergo laparoscopic cholecystectomy.  Pre-operative Diagnosis: Biliary Dyskinesia  Post-operative Diagnosis: Same  Surgeon: Coralie Keens A   Assistants: 0  Anesthesia: General endotracheal anesthesia  ASA Class: 1  Procedure Details  The patient was seen again in the Holding Room. The risks, benefits, complications, treatment options, and expected outcomes were discussed with the patient. The possibilities of reaction to medication, pulmonary aspiration, perforation of viscus, bleeding, recurrent infection, finding a normal gallbladder, the need for additional procedures, failure to diagnose a condition, the possible need to convert to an open procedure, and creating a complication requiring transfusion or operation were discussed with the patient. The likelihood of improving the patient's symptoms with return to their baseline status is good.  The patient and/or family concurred with the proposed plan, giving informed consent. The site of surgery properly noted. The patient was taken to Operating Room, identified as Mindy [name] and the procedure verified as Laparoscopic Cholecystectomy with Intraoperative Cholangiogram. A Time Out was held and the above information confirmed.  Prior to the induction of general anesthesia, antibiotic prophylaxis was administered. General endotracheal anesthesia was then administered and tolerated well. After the induction, the abdomen was prepped with Chloraprep and draped in sterile fashion. The patient was positioned in the supine position.  Local anesthetic agent was injected into the skin near the umbilicus and an incision made. We dissected down to the abdominal fascia with blunt dissection.  The fascia was incised vertically and we entered the peritoneal cavity bluntly.  A pursestring suture of 0-Vicryl was placed around the  fascial opening.  The Hasson cannula was inserted and secured with the stay suture.  Pneumoperitoneum was then created with CO2 and tolerated well without any adverse changes in the patient's vital signs. A 5-mm port was placed in the subxiphoid position.  Two 5-mm ports were placed in the right upper quadrant. All skin incisions were infiltrated with a local anesthetic agent before making the incision and placing the trocars.   We positioned the patient in reverse Trendelenburg, tilted slightly to the patient's left.  The gallbladder was identified, the fundus grasped and retracted cephalad. The gallbladder was minimally thick walled in appearance.  The infundibulum was grasped and retracted laterally, exposing the peritoneum overlying the triangle of Calot. This was then divided and exposed in a blunt fashion. The cystic duct was clearly identified and bluntly dissected circumferentially. A critical view of the cystic duct and cystic artery was obtained.  The cystic duct was then ligated with clips and divided. The cystic artery was, dissected free, ligated with clips and divided as well.   The gallbladder was dissected from the liver bed in retrograde fashion with the electrocautery. The gallbladder was removed and placed in an Endocatch sac. The liver bed was irrigated and inspected. Hemostasis was achieved with the electrocautery. Copious irrigation was utilized and was repeatedly aspirated until clear.  The gallbladder and Endocatch sac were then removed through the umbilical port site.  The pursestring suture was used to close the umbilical fascia.    We again inspected the right upper quadrant for hemostasis.  Pneumoperitoneum was released as we removed the trocars.  4-0 Monocryl was used to close the skin.   Skin glue was then applied. The patient was then extubated and brought to the recovery room in stable condition. Instrument, sponge, and needle counts were correct at closure and at the conclusion  of the case.  Findings: Chronic Cholecystitis without Cholelithiasis  Estimated Blood Loss: Minimal         Drains: 0         Specimens: Gallbladder           Complications: None; patient tolerated the procedure well.         Disposition: PACU - hemodynamically stable.         Condition: stable

## 2015-09-21 NOTE — Anesthesia Postprocedure Evaluation (Signed)
Anesthesia Post Note  Patient: Mindy [name]  Procedure(s) Performed: Procedure(s) (LRB): LAPAROSCOPIC CHOLECYSTECTOMY (N/A)  Patient location during evaluation: PACU Anesthesia Type: General Level of consciousness: awake and awake and alert Pain management: pain level controlled Vital Signs Assessment: post-procedure vital signs reviewed and stable Respiratory status: spontaneous breathing, respiratory function stable and nonlabored ventilation Anesthetic complications: no    Last Vitals:  Filed Vitals:   09/21/15 0935 09/21/15 0952  BP: 143/87 136/88  Pulse: 63 81  Temp:    Resp: 16 16    Last Pain:  Filed Vitals:   09/21/15 0958  PainSc: 2                  Lexany Belknap COKER

## 2015-09-21 NOTE — Anesthesia Preprocedure Evaluation (Signed)
Anesthesia Evaluation  Patient identified by MRN, date of birth, ID band Patient awake    Reviewed: Allergy & Precautions, NPO status , Patient's Chart, lab work & pertinent test results  Airway Mallampati: II  TM Distance: >3 FB Neck ROM: Full    Dental  (+) Teeth Intact, Dental Advisory Given   Pulmonary    breath sounds clear to auscultation       Cardiovascular  Rhythm:Regular Rate:Normal     Neuro/Psych    GI/Hepatic   Endo/Other    Renal/GU      Musculoskeletal   Abdominal   Peds  Hematology   Anesthesia Other Findings   Reproductive/Obstetrics                             Anesthesia Physical Anesthesia Plan  ASA: II  Anesthesia Plan: General   Post-op Pain Management:    Induction: Intravenous  Airway Management Planned: Oral ETT  Additional Equipment:   Intra-op Plan:   Post-operative Plan: Extubation in OR  Informed Consent: I have reviewed the patients History and Physical, chart, labs and discussed the procedure including the risks, benefits and alternatives for the proposed anesthesia with the patient or authorized representative who has indicated his/her understanding and acceptance.   Dental advisory given  Plan Discussed with: CRNA and Anesthesiologist  Anesthesia Plan Comments: (Biliary dyskinesia H/O Post-op N/V GERD  Plan GA with scopolamine  Roberts Gaudy, MD)        Anesthesia Quick Evaluation

## 2015-09-22 ENCOUNTER — Encounter (HOSPITAL_COMMUNITY): Payer: Self-pay | Admitting: Surgery

## 2015-10-22 HISTORY — PX: BREAST EXCISIONAL BIOPSY: SUR124

## 2015-10-28 ENCOUNTER — Ambulatory Visit: Payer: 59

## 2015-11-02 MED FILL — PREMARIN 0.625 MG TABLET: 0.625 | 30 days supply | Qty: 30 | Fill #8

## 2015-11-02 MED FILL — LINZESS 145 MCG CAPSULE: 145 | 90 days supply | Qty: 90 | Fill #1

## 2015-11-02 MED FILL — DEXILANT DR 60 MG CAPSULE: 60 | 30 days supply | Qty: 30 | Fill #3

## 2015-11-25 ENCOUNTER — Encounter: Payer: 59 | Attending: Internal Medicine | Admitting: Dietician

## 2015-11-25 DIAGNOSIS — Z713 Dietary counseling and surveillance: Secondary | ICD-10-CM | POA: Insufficient documentation

## 2015-11-25 NOTE — Progress Notes (Signed)
Patient was seen on 11/25/15 for the Weight Loss Class at the [facility]. The following learning objectives were met by the patient during this class:   Describe healthy choices in each food group  Describe portion size of foods  Use plate method for meal planning  Demonstrate how to read Nutrition Facts food label  Set realistic goals for weight loss, diet changes, and physical activity.   Goals:  1. Make healthy food choices in each food group.  2. Reduce portion size of foods.  3. Increase fruit and vegetable intake.  4. Use plate method for meal planning.  5. Increase physical activity.    Handouts given:   1. Nutrition Strategies for Weight Loss   2. Meal plan/portion card   3. MyPlate Planner   4. Weight Management Recipe Resources   5. Bake, Broil, Grill   

## 2015-11-26 ENCOUNTER — Other Ambulatory Visit: Payer: Self-pay | Admitting: Internal Medicine

## 2015-11-27 MED ORDER — BUTALBITAL-APAP-CAFFEINE 50-325-40 MG PO TABS: 1.0000 | ORAL_TABLET | Freq: Four times a day (QID) | ORAL | Status: DC | PRN

## 2015-11-27 MED FILL — BUTALBITAL/APAP/CAFFEINE TB: 50-325-40 | 18 days supply | Qty: 75 | Fill #0

## 2015-11-27 NOTE — Telephone Encounter (Signed)
Ok to refill 

## 2015-11-27 NOTE — Telephone Encounter (Signed)
CY - please advise. Thanks! 

## 2015-11-27 NOTE — Telephone Encounter (Signed)
Rx has been called in  

## 2015-11-27 NOTE — Telephone Encounter (Signed)
Pt asking for refill of Fiorecet. Please advise. Thank you.

## 2015-12-01 MED FILL — PREMARIN 0.625 MG TABLET: 0.625 | 30 days supply | Qty: 30 | Fill #9

## 2015-12-01 MED FILL — DEXILANT DR 60 MG CAPSULE: 60 | 30 days supply | Qty: 30 | Fill #4

## 2015-12-10 ENCOUNTER — Telehealth: Payer: 59 | Admitting: Physician Assistant

## 2015-12-10 DIAGNOSIS — J019 Acute sinusitis, unspecified: Secondary | ICD-10-CM | POA: Diagnosis not present

## 2015-12-10 DIAGNOSIS — B9689 Other specified bacterial agents as the cause of diseases classified elsewhere: Secondary | ICD-10-CM

## 2015-12-10 MED ORDER — AMOXICILLIN-POT CLAVULANATE 875-125 MG PO TABS: 1.0000 | ORAL_TABLET | Freq: Two times a day (BID) | ORAL | Status: DC

## 2015-12-10 NOTE — Progress Notes (Signed)

## 2015-12-11 MED FILL — AMOX TR-K CLV 875-125 MG TA: 875-125 | 7 days supply | Qty: 14 | Fill #0

## 2015-12-12 ENCOUNTER — Encounter (HOSPITAL_COMMUNITY): Payer: Self-pay | Admitting: *Deleted

## 2015-12-12 ENCOUNTER — Emergency Department (INDEPENDENT_AMBULATORY_CARE_PROVIDER_SITE_OTHER): Payer: 59

## 2015-12-12 ENCOUNTER — Emergency Department (INDEPENDENT_AMBULATORY_CARE_PROVIDER_SITE_OTHER)
Admission: EM | Admit: 2015-12-12 | Discharge: 2015-12-12 | Disposition: A | Discharge status: Home / Self Care | Payer: 59 | Source: Home / Self Care | Attending: Family Medicine | Admitting: Family Medicine

## 2015-12-12 DIAGNOSIS — R519 Headache, unspecified: Secondary | ICD-10-CM

## 2015-12-12 DIAGNOSIS — R51 Headache: Secondary | ICD-10-CM | POA: Diagnosis not present

## 2015-12-12 DIAGNOSIS — J3489 Other specified disorders of nose and nasal sinuses: Secondary | ICD-10-CM | POA: Diagnosis not present

## 2015-12-12 MED ORDER — PREDNISONE 50 MG PO TABS: ORAL_TABLET | ORAL | Status: DC

## 2015-12-12 MED ORDER — IPRATROPIUM BROMIDE 0.06 % NA SOLN: 2.0000 | Freq: Four times a day (QID) | NASAL | Status: DC

## 2015-12-12 NOTE — Discharge Instructions (Signed)
Use medicine as prescribed and see your doctor as needed. °

## 2015-12-12 NOTE — ED Notes (Signed)
Pt  Has   questians  About  Her  Plan of  Care     Dr  Juventino Slovak  Notified   And  Spoke  To  Patient  About  The  Details

## 2015-12-12 NOTE — ED Notes (Addendum)
Pt  Had  stuffyness    And  Congested  Treated   For   Sinus   Infection  2  Days        Ago       Slightly  Blurred  Vision  On the r  Side

## 2015-12-12 NOTE — ED Provider Notes (Signed)
CSN: [id number]     Arrival date & time 12/12/15  1259 History   None    Chief Complaint  Patient presents with  . Headache   (Consider location/radiation/quality/duration/timing/severity/associated sxs/prior Treatment) Patient is a 55 y.o. female presenting with headaches. The history is provided by the patient.  Headache Pain location:  Frontal Quality:  Dull Radiates to:  Does not radiate Onset quality:  Gradual Duration:  2 days Chronicity:  New Similar to prior headaches: no   Associated symptoms: congestion, drainage, facial pain and sinus pressure   Associated symptoms: no fever, no loss of balance and no sore throat     Past Medical History  Diagnosis Date  . Allergic rhinitis   . Migraine   . Elevated transaminase level 12.2014    ?etiology - present 09/2013 hosp for epigastric pain  . Complication of anesthesia   . PONV (postoperative nausea and vomiting)   . GERD (gastroesophageal reflux disease)    Past Surgical History  Procedure Laterality Date  . Partial hysterectomy  2007  . Neck surgery  2009  . Cholecystectomy N/A 09/21/2015    Procedure: LAPAROSCOPIC CHOLECYSTECTOMY;  Surgeon: Coralie Keens, MD;  Location: [facility] OR;  Service: General;  Laterality: N/A;   Family History  Problem Relation Age of Onset  . Hypertension Mother   . Dementia Mother   . Atrial fibrillation Father   . Diabetes type II Father   . Hypertension Father   . Hypertension Brother   . Coronary artery disease Father 33    stent x 2, AoVR   Social History  Substance Use Topics  . Smoking status: Never Smoker   . Smokeless tobacco: None  . Alcohol Use: Yes     Comment: occ   OB History    No data available     Review of Systems  Constitutional: Negative.  Negative for fever and chills.  HENT: Positive for congestion, postnasal drip and sinus pressure. Negative for facial swelling and sore throat.   Cardiovascular: Negative.   Gastrointestinal: Negative.   Neurological:  Positive for headaches. Negative for loss of balance.  All other systems reviewed and are negative.   Allergies  Oxaprozin  Home Medications   Prior to Admission medications   Medication Sig Start Date End Date Taking? Authorizing Provider  acetaminophen (TYLENOL) 325 MG tablet Take 650 mg by mouth every 6 (six) hours as needed.    Historical Provider, MD  amoxicillin-clavulanate (AUGMENTIN) 875-125 MG tablet Take 1 tablet by mouth 2 (two) times daily. 12/10/15   Brunetta Jeans, PA-C  butalbital-acetaminophen-caffeine (FIORICET, ESGIC) 2791670327 MG tablet Take 1 tablet by mouth every 6 (six) hours as needed for headache. 11/27/15   Deneise Lever, MD  calcium-vitamin D (OSCAL WITH D) 500-200 MG-UNIT per tablet Take 1 tablet by mouth daily with breakfast.    Historical Provider, MD  DEXILANT 60 MG capsule Take 60 mg by mouth daily.  07/12/15   Historical Provider, MD  DYMISTA 137-50 MCG/ACT SUSP Place 2 sprays into both nostrils at bedtime. 08/21/15   Deneise Lever, MD  estrogens, conjugated, (PREMARIN) 0.625 MG tablet Take 0.625 mg by mouth daily. Take daily for 21 days then do not take for 7 days.    Historical Provider, MD  HYDROcodone-acetaminophen (NORCO) 5-325 MG tablet Take 1-2 tablets by mouth every 6 (six) hours as needed for moderate pain. 09/21/15   Coralie Keens, MD  ipratropium (ATROVENT) 0.06 % nasal spray Place 2 sprays into both nostrils 4 (  four) times daily. 12/12/15   Billy Fischer, MD  LINZESS 145 MCG CAPS capsule Take 145 mcg by mouth every other day.  07/12/15   Historical Provider, MD  ondansetron (ZOFRAN) 4 MG tablet Take 1 tablet (4 mg total) by mouth every 8 (eight) hours as needed for nausea or vomiting. 09/21/15   Coralie Keens, MD  oxymetazoline (AFRIN) 0.05 % nasal spray Place 2 sprays into the nose at bedtime as needed for congestion.    Historical Provider, MD  predniSONE (DELTASONE) 50 MG tablet 1 tab daily for 2 days then 1/2 tab daily for 2 days. 12/12/15    Billy Fischer, MD  sodium chloride (OCEAN) 0.65 % nasal spray Place 1 spray into the nose 4 (four) times daily as needed for congestion (and dryness).    Historical Provider, MD   Meds Ordered and Administered this Visit  Medications - No data to display  BP 128/79 mmHg  Pulse 78  Temp(Src) 98.6 F (37 C) (Oral)  Resp 16  SpO2 100% No data found.   Physical Exam  Constitutional: She is oriented to person, place, and time. She appears well-developed and well-nourished.  HENT:  Right Ear: External ear normal.  Left Ear: External ear normal.  Mouth/Throat: Oropharynx is clear and moist.  Eyes: Conjunctivae are normal. Pupils are equal, round, and reactive to light.  Neck: Normal range of motion. Neck supple.  Cardiovascular: Normal rate, normal heart sounds and intact distal pulses.   Pulmonary/Chest: Effort normal and breath sounds normal.  Lymphadenopathy:    She has no cervical adenopathy.  Neurological: She is alert and oriented to person, place, and time. No cranial nerve deficit. Coordination normal.  Skin: Skin is warm and dry.  Nursing note and vitals reviewed.   ED Course  Procedures (including critical care time)  Labs Review Labs Reviewed - No data to display  Imaging Review Dg Sinuses Complete  12/12/2015  CLINICAL DATA:  Sinus infection EXAM: PARANASAL SINUSES - COMPLETE 3 + VIEW COMPARISON:  06/04/2010 FINDINGS: Paranasal sinuses are grossly clear.  No obvious bony destruction. IMPRESSION: No evidence of acute sinusitis. Electronically Signed   By: Marybelle Killings M.D.   On: 12/12/2015 13:40   X-rays reviewed and report per radiologist.   Visual Acuity Review  Right Eye Distance:   Left Eye Distance:   Bilateral Distance:    Right Eye Near:   Left Eye Near:    Bilateral Near:         MDM   1. Sinus headache    Meds ordered this encounter  Medications  . ipratropium (ATROVENT) 0.06 % nasal spray    Sig: Place 2 sprays into both nostrils 4  (four) times daily.    Dispense:  15 mL    Refill:  1  . predniSONE (DELTASONE) 50 MG tablet    Sig: 1 tab daily for 2 days then 1/2 tab daily for 2 days.    Dispense:  3 tablet    Refill:  0       Billy Fischer, MD 12/12/15 1351

## 2015-12-13 MED FILL — IPRATROPIUM 0.06% SPRAY: 0.06 | 21 days supply | Qty: 15 | Fill #0

## 2015-12-13 MED FILL — predniSONE 50 MG TABS: 50 | 4 days supply | Qty: 3 | Fill #0

## 2015-12-27 ENCOUNTER — Encounter: Payer: Self-pay | Admitting: Internal Medicine

## 2015-12-27 ENCOUNTER — Ambulatory Visit (INDEPENDENT_AMBULATORY_CARE_PROVIDER_SITE_OTHER): Payer: 59 | Admitting: Internal Medicine

## 2015-12-27 VITALS — BP 102/70 | HR 59 | Temp 98.6°F | Resp 16 | Wt 136.0 lb

## 2015-12-27 DIAGNOSIS — G43009 Migraine without aura, not intractable, without status migrainosus: Secondary | ICD-10-CM | POA: Diagnosis not present

## 2015-12-27 DIAGNOSIS — R519 Headache, unspecified: Secondary | ICD-10-CM | POA: Insufficient documentation

## 2015-12-27 DIAGNOSIS — R51 Headache: Secondary | ICD-10-CM

## 2015-12-27 DIAGNOSIS — G4489 Other headache syndrome: Secondary | ICD-10-CM | POA: Diagnosis not present

## 2015-12-27 DIAGNOSIS — G43909 Migraine, unspecified, not intractable, without status migrainosus: Secondary | ICD-10-CM | POA: Insufficient documentation

## 2015-12-27 MED ORDER — ELETRIPTAN HYDROBROMIDE 40 MG PO TABS: 40.0000 mg | ORAL_TABLET | ORAL | Status: DC | PRN

## 2015-12-27 MED FILL — RELPAX 40 MG TABLET: 40 | 30 days supply | Qty: 9 | Fill #0

## 2015-12-27 NOTE — Progress Notes (Signed)
Subjective:    Patient ID: Mindy [name], female    DOB: 1960-11-07, 55 y.o.   MRN: [id number]  HPI She is here for an acute visit for headaches.  Migraine headache: Her headache started three weeks ago.  She woke up with sinus issues and could not get her headache under control.  She took the fioricet and it usually that works.  2-3 times a year it does not work.  She sometimes has to go to urgent care to get her intractable headache controlled.  She had pain on the right side, loss of peripheral vision, nausea and sinus congestion.  She has chronic allergies and sinus issues and she sees Dr Tarri Fuller for her allergies.  Urgent care wanted to deal with the congestion, not the headache.  She still has a headache, but no longer has the migraine.  The headache is daily.   She wakes up with it. By the end of the day the headache has gotten worse due to being on the computer all day.  She is also on HRT for menopausal symptoms.  She has had to stop the patch in the past due to headaches.  She does not think her current HRT is causing her headaches.  She is not sure if the headaches, migraines are are a combination of stress, sinus issues, etc.  She has taken allergy shots.  She thinks her stuffiness is an issue.  She has seen ENT and could have sinus surgery, which may or may not help.  She has taken sudafed in the past and that helps, but she tries not to take it long term.    She did try a friends relpax and it helped her migraine in the past.    She has tried oral anti-histamines and they have not helped.  She has tried singular.  She uses the nasal sprays.   Her migraines vary, can be either side of her head or her whole head, she often has nausea, no aura typically, but can have vision changes.    Medications and allergies reviewed with patient and updated if appropriate.  Patient Active Problem List   Diagnosis Date Noted  . Lateral epicondylitis of left elbow 06/27/2015  . Cervical  radiculitis 06/27/2015  . Biliary dyskinesia 05/16/2015  . Elevated transaminase level 09/20/2013  . Epigastric pain 09/20/2013  . Non-allergic vasomotor rhinitis 06/01/2012  . ALLERGIC RHINITIS 11/13/2007    Current Outpatient Prescriptions on File Prior to Visit  Medication Sig Dispense Refill  . acetaminophen (TYLENOL) 325 MG tablet Take 650 mg by mouth every 6 (six) hours as needed.    . butalbital-acetaminophen-caffeine (FIORICET, ESGIC) 50-325-40 MG tablet Take 1 tablet by mouth every 6 (six) hours as needed for headache. 75 tablet 0  . calcium-vitamin D (OSCAL WITH D) 500-200 MG-UNIT per tablet Take 1 tablet by mouth daily with breakfast.    . DEXILANT 60 MG capsule Take 60 mg by mouth daily.   12  . DYMISTA 137-50 MCG/ACT SUSP Place 2 sprays into both nostrils at bedtime. 23 g 0  . estrogens, conjugated, (PREMARIN) 0.625 MG tablet Take 0.625 mg by mouth daily. Take daily for 21 days then do not take for 7 days.    Marland Kitchen ipratropium (ATROVENT) 0.06 % nasal spray Place 2 sprays into both nostrils 4 (four) times daily. 15 mL 1  . LINZESS 145 MCG CAPS capsule Take 145 mcg by mouth every other day.   3  . oxymetazoline (AFRIN) 0.05 %  nasal spray Place 2 sprays into the nose at bedtime as needed for congestion.    . sodium chloride (OCEAN) 0.65 % nasal spray Place 1 spray into the nose 4 (four) times daily as needed for congestion (and dryness).     No current facility-administered medications on file prior to visit.    Past Medical History  Diagnosis Date  . Allergic rhinitis   . Migraine   . Elevated transaminase level 12.2014    ?etiology - present 09/2013 hosp for epigastric pain  . Complication of anesthesia   . PONV (postoperative nausea and vomiting)   . GERD (gastroesophageal reflux disease)     Past Surgical History  Procedure Laterality Date  . Partial hysterectomy  2007  . Neck surgery  2009  . Cholecystectomy N/A 09/21/2015    Procedure: LAPAROSCOPIC CHOLECYSTECTOMY;   Surgeon: Coralie Keens, MD;  Location: [place];  Service: General;  Laterality: N/A;    Social History   Social History  . Marital Status: Divorced    Spouse Name: N/A  . Number of Children: N/A  . Years of Education: N/A   Occupational History  . RN    Social History Main Topics  . Smoking status: Never Smoker   . Smokeless tobacco: Not on file  . Alcohol Use: Yes     Comment: occ  . Drug Use: No  . Sexual Activity: Not on file   Other Topics Concern  . Not on file   Social History Narrative    Family History  Problem Relation Age of Onset  . Hypertension Mother   . Dementia Mother   . Atrial fibrillation Father   . Diabetes type II Father   . Hypertension Father   . Hypertension Brother   . Coronary artery disease Father 42    stent x 2, AoVR    Review of Systems See HPI    Objective:   Filed Vitals:   12/27/15 1136  BP: 102/70  Pulse: 59  Temp: 98.6 F (37 C)  Resp: 16   Filed Weights   12/27/15 1136  Weight: 136 lb (61.689 kg)   Body mass index is 23.33 kg/(m^2).   Physical Exam  Constitutional: She is oriented to person, place, and time. She appears well-developed and well-nourished. No distress.  Neurological: She is alert and oriented to person, place, and time. No cranial nerve deficit. Coordination normal.  Psychiatric: She has a normal mood and affect. Her behavior is normal. Judgment and thought content normal.          Assessment & Plan:   Migraines, headaches:  Continue fioricet as needed Will try relpax for migraines - it has worked for her in the past She will continue to work on getting her allergies and sinus issues well controlled Uses nasal sprays Oral anti-histamines are not effective, singulair is not effective Call with questions or concerns  She will schedule a PE

## 2015-12-27 NOTE — Assessment & Plan Note (Signed)
Migraine headaches Several triggers: stress, sinus issues, allergies, computer Uses fioricet or relpax

## 2015-12-27 NOTE — Progress Notes (Signed)
Pre visit review using our clinic review tool, if applicable. No additional management support is needed unless otherwise documented below in the visit note. 

## 2015-12-27 NOTE — Patient Instructions (Signed)
   Medications reviewed and updated.  Changes include start relpax as needed for your migraines.  You can continue to use the fioricet as needed, but do not take together.   Your prescription(s) have been submitted to your pharmacy. Please take as directed and contact our office if you believe you are having problem(s) with the medication(s).  Please followup for your physical exam.

## 2016-01-01 MED FILL — DEXILANT DR 60 MG CAPSULE: 60 | 30 days supply | Qty: 30 | Fill #5

## 2016-01-10 DIAGNOSIS — Z124 Encounter for screening for malignant neoplasm of cervix: Secondary | ICD-10-CM | POA: Diagnosis not present

## 2016-01-10 DIAGNOSIS — Z01419 Encounter for gynecological examination (general) (routine) without abnormal findings: Secondary | ICD-10-CM | POA: Diagnosis not present

## 2016-01-10 DIAGNOSIS — Z6823 Body mass index (BMI) 23.0-23.9, adult: Secondary | ICD-10-CM | POA: Diagnosis not present

## 2016-01-10 MED FILL — valACYclovir HCL 1 GM TABS: 1 | 7 days supply | Qty: 21 | Fill #0

## 2016-01-10 MED FILL — PREMARIN 0.45 MG TABLET: 0.45 | 30 days supply | Qty: 30 | Fill #0

## 2016-01-17 DIAGNOSIS — L821 Other seborrheic keratosis: Secondary | ICD-10-CM | POA: Diagnosis not present

## 2016-01-17 DIAGNOSIS — D225 Melanocytic nevi of trunk: Secondary | ICD-10-CM | POA: Diagnosis not present

## 2016-01-17 DIAGNOSIS — D2339 Other benign neoplasm of skin of other parts of face: Secondary | ICD-10-CM | POA: Diagnosis not present

## 2016-01-17 DIAGNOSIS — L2084 Intrinsic (allergic) eczema: Secondary | ICD-10-CM | POA: Diagnosis not present

## 2016-01-17 DIAGNOSIS — B029 Zoster without complications: Secondary | ICD-10-CM | POA: Diagnosis not present

## 2016-01-17 DIAGNOSIS — L853 Xerosis cutis: Secondary | ICD-10-CM | POA: Diagnosis not present

## 2016-01-17 MED FILL — HYDROCORTISONE 2.5% CREAM: 2.5 | 20 days supply | Qty: 60 | Fill #0

## 2016-01-19 MED FILL — PREMARIN 0.625 MG TABLET: 0.625 | 30 days supply | Qty: 30 | Fill #0

## 2016-01-30 MED FILL — DEXILANT DR 60 MG CAPSULE: 60 | 30 days supply | Qty: 30 | Fill #6

## 2016-01-31 ENCOUNTER — Other Ambulatory Visit: Payer: Self-pay | Admitting: Internal Medicine

## 2016-02-01 MED ORDER — DYMISTA 137-50 MCG/ACT NA SUSP: 2.0000 | Freq: Every day | NASAL | Status: DC

## 2016-02-01 MED ORDER — BUTALBITAL-APAP-CAFFEINE 50-325-40 MG PO TABS: 1.0000 | ORAL_TABLET | Freq: Four times a day (QID) | ORAL | Status: DC | PRN

## 2016-02-01 MED FILL — DYMISTA NASAL SPRAY: 137-50 | 30 days supply | Qty: 23 | Fill #0

## 2016-02-01 MED FILL — BUTALBITAL/APAP/CAFFEINE TB: 50-325-40 | 18 days supply | Qty: 75 | Fill #0

## 2016-02-01 NOTE — Telephone Encounter (Signed)
Ok to refill fioricet for 6 months, Dymista for 12 months

## 2016-02-01 NOTE — Telephone Encounter (Signed)
Pt requesting refill on dymista and fioricet. Last fioricet refill: 11/27/15 #75 with 0 refills- take 1 tab po q6h prn headache.  Last ov: 03/27/15 Next ov: 03/25/16  CY ok to refill?  Thanks!

## 2016-02-22 MED FILL — PREMARIN 0.625 MG TABLET: 0.625 | 30 days supply | Qty: 30 | Fill #1

## 2016-02-28 MED FILL — DEXILANT DR 60 MG CAPSULE: 60 | 30 days supply | Qty: 30 | Fill #7

## 2016-02-28 MED FILL — RELPAX 40 MG TABLET: 40 | 30 days supply | Qty: 9 | Fill #1

## 2016-03-19 MED FILL — BUTALB-ACETAMIN-CAFF 50-325: 50-325-40 | 18 days supply | Qty: 75 | Fill #1

## 2016-03-25 ENCOUNTER — Ambulatory Visit (INDEPENDENT_AMBULATORY_CARE_PROVIDER_SITE_OTHER): Payer: 59 | Admitting: Internal Medicine

## 2016-03-25 ENCOUNTER — Encounter: Payer: Self-pay | Admitting: Internal Medicine

## 2016-03-25 VITALS — BP 114/62 | HR 78 | Ht 63.0 in | Wt 135.0 lb

## 2016-03-25 DIAGNOSIS — R51 Headache: Secondary | ICD-10-CM | POA: Diagnosis not present

## 2016-03-25 DIAGNOSIS — J300 Vasomotor rhinitis: Secondary | ICD-10-CM | POA: Diagnosis not present

## 2016-03-25 DIAGNOSIS — R519 Headache, unspecified: Secondary | ICD-10-CM
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
## 2016-03-25 NOTE — Assessment & Plan Note (Signed)
Chronic rhinitis as an adult with seasonal and environmental exacerbations particularly reflecting weather changes. She says she is satisfied for now with imperfect control using current meds.

## 2016-03-25 NOTE — Patient Instructions (Addendum)
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

## 2016-03-25 NOTE — Assessment & Plan Note (Signed)
Chronic tendency to headache which tends to associate with exacerbations of her rhinitis

## 2016-03-26 ENCOUNTER — Ambulatory Visit: Payer: 59 | Admitting: Internal Medicine

## 2016-03-26 MED FILL — PREMARIN 0.625 MG TABLET: 0.625 | 30 days supply | Qty: 30 | Fill #2

## 2016-03-28 MED FILL — DEXILANT DR 60 MG CAPSULE: 60 | 30 days supply | Qty: 30 | Fill #8

## 2016-04-26 MED FILL — DEXILANT DR 60 MG CAPSULE: 60 | 30 days supply | Qty: 30 | Fill #9

## 2016-04-26 MED FILL — PREMARIN 0.625 MG TABLET: 0.625 | 30 days supply | Qty: 30 | Fill #3

## 2016-05-06 MED FILL — RELPAX 40 MG TABLET: 40 | 30 days supply | Qty: 9 | Fill #2

## 2016-05-06 MED FILL — BUTALB-ACETAMIN-CAFF 50-325: 50-325-40 | 18 days supply | Qty: 75 | Fill #2

## 2016-05-27 MED FILL — DEXILANT DR 60 MG CAPSULE: 60 | 30 days supply | Qty: 30 | Fill #10

## 2016-05-29 MED FILL — PREMARIN 0.625 MG TABLET: 0.625 | 30 days supply | Qty: 30 | Fill #4

## 2016-06-05 ENCOUNTER — Other Ambulatory Visit (INDEPENDENT_AMBULATORY_CARE_PROVIDER_SITE_OTHER): Payer: 59

## 2016-06-05 ENCOUNTER — Encounter: Payer: Self-pay | Admitting: Internal Medicine

## 2016-06-05 ENCOUNTER — Ambulatory Visit (INDEPENDENT_AMBULATORY_CARE_PROVIDER_SITE_OTHER): Payer: 59 | Admitting: Internal Medicine

## 2016-06-05 VITALS — BP 102/74 | HR 76 | Temp 98.3°F | Resp 16 | Ht 63.0 in | Wt 127.0 lb

## 2016-06-05 DIAGNOSIS — K219 Gastro-esophageal reflux disease without esophagitis: Secondary | ICD-10-CM

## 2016-06-05 DIAGNOSIS — G43009 Migraine without aura, not intractable, without status migrainosus: Secondary | ICD-10-CM | POA: Diagnosis not present

## 2016-06-05 DIAGNOSIS — Z0000 Encounter for general adult medical examination without abnormal findings: Secondary | ICD-10-CM | POA: Diagnosis not present

## 2016-06-05 LAB — COMPREHENSIVE METABOLIC PANEL
ALT: 10 U/L (ref 0–35)
AST: 14 U/L (ref 0–37)
Albumin: 4.4 g/dL (ref 3.5–5.2)
Alkaline Phosphatase: 73 U/L (ref 39–117)
BUN: 9 mg/dL (ref 6–23)
CO2: 30 mEq/L (ref 19–32)
Calcium: 9.5 mg/dL (ref 8.4–10.5)
Chloride: 103 mEq/L (ref 96–112)
Creatinine, Ser: 0.82 mg/dL (ref 0.40–1.20)
GFR: 77.5 mL/min (ref >60.00)
Glucose, Bld: 87 mg/dL (ref 70–99)
Potassium: 4.4 mEq/L (ref 3.5–5.1)
Sodium: 140 mEq/L (ref 135–145)
Total Bilirubin: 0.4 mg/dL (ref 0.2–1.2)
Total Protein: 6.9 g/dL (ref 6.0–8.3)

## 2016-06-05 LAB — CBC WITH DIFFERENTIAL/PLATELET
Basophils Absolute: 0 10*3/uL (ref 0.0–0.1)
Basophils Relative: 0.7 % (ref 0.0–3.0)
Eosinophils Absolute: 0.1 10*3/uL (ref 0.0–0.7)
Eosinophils Relative: 2.9 % (ref 0.0–5.0)
HCT: 40.5 % (ref 36.0–46.0)
Hemoglobin: 14.1 g/dL (ref 12.0–15.0)
Lymphocytes Relative: 40.8 % (ref 12.0–46.0)
Lymphs Abs: 1.4 10*3/uL (ref 0.7–4.0)
MCHC: 34.7 g/dL (ref 30.0–36.0)
MCV: 92.4 fl (ref 78.0–100.0)
Monocytes Absolute: 0.4 10*3/uL (ref 0.1–1.0)
Monocytes Relative: 11 % (ref 3.0–12.0)
Neutro Abs: 1.6 10*3/uL (ref 1.4–7.7)
Neutrophils Relative %: 44.6 % (ref 43.0–77.0)
Platelets: 267 10*3/uL (ref 150.0–400.0)
RBC: 4.38 Mil/uL (ref 3.87–5.11)
RDW: 13.2 % (ref 11.5–15.5)
WBC: 3.5 10*3/uL — ABNORMAL LOW (ref 4.0–10.5)

## 2016-06-05 LAB — LIPID PANEL
Cholesterol: 230 mg/dL — ABNORMAL HIGH (ref 0–200)
HDL: 80.2 mg/dL (ref >39.00)
LDL Cholesterol: 132 mg/dL — ABNORMAL HIGH (ref 0–99)
NonHDL: 149.8
Total CHOL/HDL Ratio: 3
Triglycerides: 91 mg/dL (ref 0.0–149.0)
VLDL: 18.2 mg/dL (ref 0.0–40.0)

## 2016-06-05 LAB — TSH: TSH: 3.56 u[IU]/mL (ref 0.35–4.50)

## 2016-06-05 MED ORDER — LINACLOTIDE 72 MCG PO CAPS: 72.0000 ug | ORAL_CAPSULE | Freq: Every day | ORAL | 0 refills | Status: DC

## 2016-06-05 MED FILL — LINZESS 72 MCG CAPSULE: 72 | 30 days supply | Qty: 30 | Fill #0

## 2016-06-05 NOTE — Progress Notes (Signed)
Subjective:    Patient ID: Mindy [name], female    DOB: 1961-03-30, 55 y.o.   MRN: [id number]  HPI She is here for a physical exam.   She denies any changes in her history of family history since her last physical.    She has no concerns.   Medications and allergies reviewed with patient and updated if appropriate.  Patient Active Problem List   Diagnosis Date Noted  . Migraine 12/27/2015  . Headache 12/27/2015  . Lateral epicondylitis of left elbow 06/27/2015  . Cervical radiculitis 06/27/2015  . Elevated transaminase level 09/20/2013  . Epigastric pain 09/20/2013  . 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 acetaminophen (TYLENOL) 325 MG tablet Take 650 mg by mouth every 6 (six) hours as needed.    . butalbital-acetaminophen-caffeine (FIORICET, ESGIC) 50-325-40 MG tablet Take 1 tablet by mouth every 6 (six) hours as needed for headache. 75 tablet 5  . DEXILANT 60 MG capsule Take 60 mg by mouth daily.   12  . DYMISTA 137-50 MCG/ACT SUSP Place 2 sprays into both nostrils at bedtime. 23 g 11  . eletriptan (RELPAX) 40 MG tablet Take 1 tablet (40 mg total) by mouth as needed for migraine or headache. May repeat in 2 hours if headache persists or recurs. 10 tablet 5  . estrogens, conjugated, (PREMARIN) 0.625 MG tablet Take 0.625 mg by mouth daily. Take daily for 21 days then do not take for 7 days.    Marland Kitchen LINZESS 145 MCG CAPS capsule Take 145 mcg by mouth every other day.   3  . Naproxen Sodium (ALEVE) 220 MG CAPS Take by mouth.    Marland Kitchen oxymetazoline (AFRIN) 0.05 % nasal spray Place 2 sprays into the nose at bedtime as needed for congestion.    . sodium chloride (OCEAN) 0.65 % nasal spray Place 1 spray into the nose 4 (four) times daily as needed for congestion (and dryness).     No current facility-administered medications on file prior to visit.     Past Medical History:  Diagnosis  Date  . Allergic rhinitis   . Complication of anesthesia   . Elevated transaminase level 12.2014   ?etiology - present 09/2013 hosp for epigastric pain  . GERD (gastroesophageal reflux disease)   . Migraine   . PONV (postoperative nausea and vomiting)     Past Surgical History:  Procedure Laterality Date  . CHOLECYSTECTOMY N/A 09/21/2015   Procedure: LAPAROSCOPIC CHOLECYSTECTOMY;  Surgeon: Coralie Keens, MD;  Location: [facility];  Service: General;  Laterality: N/A;  . NECK SURGERY  2009  . PARTIAL HYSTERECTOMY  2007    Social History   Social History  . Marital status: Divorced    Spouse name: N/A  . Number of children: N/A  . Years of education: N/A   Occupational History  . RN    Social History Main Topics  . Smoking status: Never Smoker  . Smokeless tobacco: Never Used  . Alcohol use Yes     Comment: occ  . Drug use: No  . Sexual activity: Not Asked   Other Topics Concern  . None   Social History Narrative   Exercise: on average 3/week - running, weights sometimes    Family History  Problem Relation Age of Onset  . Atrial fibrillation Father   . Diabetes type II Father   . Hypertension Father   . Coronary artery  disease Father 31    stent x 2, AoVR  . Hypertension Mother   . Dementia Mother   . Hypertension Brother     Review of Systems  Constitutional: Negative for appetite change, chills, fatigue, fever and unexpected weight change.  HENT: Positive for sinus pressure (occ with allergies). Negative for hearing loss.   Eyes: Negative for visual disturbance.  Respiratory: Negative for cough, shortness of breath and wheezing.   Cardiovascular: Negative for chest pain, palpitations and leg swelling.  Gastrointestinal: Positive for constipation (takes linzess 1-2 times a week, not daily). Negative for abdominal pain, blood in stool, diarrhea and nausea.       Gerd controlled  Genitourinary: Negative for dysuria and hematuria.  Musculoskeletal: Negative  for arthralgias and back pain.  Skin: Negative for color change and rash.  Neurological: Positive for headaches. Negative for dizziness and light-headedness.  Psychiatric/Behavioral: Negative for dysphoric mood. The patient is not nervous/anxious.        Objective:   Vitals:   06/05/16 0754  BP: 102/74  Pulse: 76  Resp: 16  Temp: 98.3 F (36.8 C)   Filed Weights   06/05/16 0754  Weight: 127 lb (57.6 kg)   Body mass index is 22.5 kg/m.   Physical Exam Constitutional: She appears well-developed and well-nourished. No distress.  HENT:  Head: Normocephalic and atraumatic.  Right Ear: External ear normal. Normal ear canal and TM Left Ear: External ear normal.  Normal ear canal and TM Mouth/Throat: Oropharynx is clear and moist.  Eyes: Conjunctivae and EOM are normal.  Neck: Neck supple. No tracheal deviation present. No thyromegaly present.  No carotid bruit  Cardiovascular: Normal rate, regular rhythm and normal heart sounds.   No murmur heard.  No edema. Pulmonary/Chest: Effort normal and breath sounds normal. No respiratory distress. She has no wheezes. She has no rales.  Breast: deferred to Gyn Abdominal: Soft. She exhibits no distension. There is no tenderness.  Lymphadenopathy: She has no cervical adenopathy.  Skin: Skin is warm and dry. She is not diaphoretic.  Psychiatric: She has a normal mood and affect. Her behavior is normal.         Assessment & Plan:   Physical exam: Screening blood work   ordered Immunizations td up to date at work, other immunizations up to date Colonoscopy   Up to date  Mammogram  Up to date  Gyn    Up to date  Eye exams  Has appt this month EKG done 09/2013 Exercise  - regular - runs three times a week, does weights Weight - normal BMI Skin  -- sees derm, no concerns today Substance abuse - none  See Problem List for Assessment and Plan of chronic medical problems.

## 2016-06-05 NOTE — Assessment & Plan Note (Signed)
GERD controlled Continue daily medication  

## 2016-06-05 NOTE — Progress Notes (Signed)
Pre visit review using our clinic review tool, if applicable. No additional management support is needed unless otherwise documented below in the visit note. 

## 2016-06-05 NOTE — Assessment & Plan Note (Signed)
1-2 migraines a month replax works well Continue relpax as needed

## 2016-06-05 NOTE — Patient Instructions (Addendum)
Test(s) ordered today. Your results will be released to [place] (or called to you) after review, usually within 72hours after test completion. If any changes need to be made, you will be notified at that same time.  All other Health Maintenance issues reviewed.   All recommended immunizations and age-appropriate screenings are up-to-date or discussed.  No immunizations administered today.   Medications reviewed and updated.  No changes recommended at this time.  Please followup in one year or sooner if needed   Health Maintenance, Female Adopting a healthy lifestyle and getting preventive care can go a long way to promote health and wellness. Talk with your health care provider about what schedule of regular examinations is right for you. This is a good chance for you to check in with your provider about disease prevention and staying healthy. In between checkups, there are plenty of things you can do on your own. Experts have done a lot of research about which lifestyle changes and preventive measures are most likely to keep you healthy. Ask your health care provider for more information. WEIGHT AND DIET  Eat a healthy diet  Be sure to include plenty of vegetables, fruits, low-fat dairy products, and lean protein.  Do not eat a lot of foods high in solid fats, added sugars, or salt.  Get regular exercise. This is one of the most important things you can do for your health.  Most adults should exercise for at least 150 minutes each week. The exercise should increase your heart rate and make you sweat (moderate-intensity exercise).  Most adults should also do strengthening exercises at least twice a week. This is in addition to the moderate-intensity exercise.  Maintain a healthy weight  Body mass index (BMI) is a measurement that can be used to identify possible weight problems. It estimates body fat based on height and weight. Your health care provider can help determine your BMI and  help you achieve or maintain a healthy weight.  For females 47 years of age and older:   A BMI below 18.5 is considered underweight.  A BMI of 18.5 to 24.9 is normal.  A BMI of 25 to 29.9 is considered overweight.  A BMI of 30 and above is considered obese.  Watch levels of cholesterol and blood lipids  You should start having your blood tested for lipids and cholesterol at 53 years of age, then have this test every 5 years.  You may need to have your cholesterol levels checked more often if:  Your lipid or cholesterol levels are high.  You are older than 53 years of age.  You are at high risk for heart disease.  CANCER SCREENING   Lung Cancer  Lung cancer screening is recommended for adults 31-94 years old who are at high risk for lung cancer because of a history of smoking.  A yearly low-dose CT scan of the lungs is recommended for people who:  Currently smoke.  Have quit within the past 15 years.  Have at least a 30-pack-year history of smoking. A pack year is smoking an average of one pack of cigarettes a day for 1 year.  Yearly screening should continue until it has been 15 years since you quit.  Yearly screening should stop if you develop a health problem that would prevent you from having lung cancer treatment.  Breast Cancer  Practice breast self-awareness. This means understanding how your breasts normally appear and feel.  It also means doing regular breast self-exams. Let your  health care provider know about any changes, no matter how small.  If you are in your 20s or 30s, you should have a clinical breast exam (CBE) by a health care provider every 1-3 years as part of a regular health exam.  If you are 104 or older, have a CBE every year. Also consider having a breast X-ray (mammogram) every year.  If you have a family history of breast cancer, talk to your health care provider about genetic screening.  If you are at high risk for breast cancer, talk  to your health care provider about having an MRI and a mammogram every year.  Breast cancer gene (BRCA) assessment is recommended for women who have family members with BRCA-related cancers. BRCA-related cancers include:  Breast.  Ovarian.  Tubal.  Peritoneal cancers.  Results of the assessment will determine the need for genetic counseling and BRCA1 and BRCA2 testing. Cervical Cancer Your health care provider may recommend that you be screened regularly for cancer of the pelvic organs (ovaries, uterus, and vagina). This screening involves a pelvic examination, including checking for microscopic changes to the surface of your cervix (Pap test). You may be encouraged to have this screening done every 3 years, beginning at age 42.  For women ages 97-65, health care providers may recommend pelvic exams and Pap testing every 3 years, or they may recommend the Pap and pelvic exam, combined with testing for human papilloma virus (HPV), every 5 years. Some types of HPV increase your risk of cervical cancer. Testing for HPV may also be done on women of any age with unclear Pap test results.  Other health care providers may not recommend any screening for nonpregnant women who are considered low risk for pelvic cancer and who do not have symptoms. Ask your health care provider if a screening pelvic exam is right for you.  If you have had past treatment for cervical cancer or a condition that could lead to cancer, you need Pap tests and screening for cancer for at least 20 years after your treatment. If Pap tests have been discontinued, your risk factors (such as having a new sexual partner) need to be reassessed to determine if screening should resume. Some women have medical problems that increase the chance of getting cervical cancer. In these cases, your health care provider may recommend more frequent screening and Pap tests. Colorectal Cancer  This type of cancer can be detected and often  prevented.  Routine colorectal cancer screening usually begins at 53 years of age and continues through 53 years of age.  Your health care provider may recommend screening at an earlier age if you have risk factors for colon cancer.  Your health care provider may also recommend using home test kits to check for hidden blood in the stool.  A small camera at the end of a tube can be used to examine your colon directly (sigmoidoscopy or colonoscopy). This is done to check for the earliest forms of colorectal cancer.  Routine screening usually begins at age 51.  Direct examination of the colon should be repeated every 5-10 years through 53 years of age. However, you may need to be screened more often if early forms of precancerous polyps or small growths are found. Skin Cancer  Check your skin from head to toe regularly.  Tell your health care provider about any new moles or changes in moles, especially if there is a change in a mole's shape or color.  Also tell your health  care provider if you have a mole that is larger than the size of a pencil eraser.  Always use sunscreen. Apply sunscreen liberally and repeatedly throughout the day.  Protect yourself by wearing long sleeves, pants, a wide-brimmed hat, and sunglasses whenever you are outside. HEART DISEASE, DIABETES, AND HIGH BLOOD PRESSURE   High blood pressure causes heart disease and increases the risk of stroke. High blood pressure is more likely to develop in:  People who have blood pressure in the high end of the normal range (130-139/85-89 mm Hg).  People who are overweight or obese.  People who are African American.  If you are 109-50 years of age, have your blood pressure checked every 3-5 years. If you are 22 years of age or older, have your blood pressure checked every year. You should have your blood pressure measured twice--once when you are at a hospital or clinic, and once when you are not at a hospital or clinic.  Record the average of the two measurements. To check your blood pressure when you are not at a hospital or clinic, you can use:  An automated blood pressure machine at a pharmacy.  A home blood pressure monitor.  If you are between 75 years and 79 years old, ask your health care provider if you should take aspirin to prevent strokes.  Have regular diabetes screenings. This involves taking a blood sample to check your fasting blood sugar level.  If you are at a normal weight and have a low risk for diabetes, have this test once every three years after 53 years of age.  If you are overweight and have a high risk for diabetes, consider being tested at a younger age or more often. PREVENTING INFECTION  Hepatitis B  If you have a higher risk for hepatitis B, you should be screened for this virus. You are considered at high risk for hepatitis B if:  You were born in a country where hepatitis B is common. Ask your health care provider which countries are considered high risk.  Your parents were born in a high-risk country, and you have not been immunized against hepatitis B (hepatitis B vaccine).  You have HIV or AIDS.  You use needles to inject street drugs.  You live with someone who has hepatitis B.  You have had sex with someone who has hepatitis B.  You get hemodialysis treatment.  You take certain medicines for conditions, including cancer, organ transplantation, and autoimmune conditions. Hepatitis C  Blood testing is recommended for:  Everyone born from [birth year] through 1963.  Anyone with known risk factors for hepatitis C. Sexually transmitted infections (STIs)  You should be screened for sexually transmitted infections (STIs) including gonorrhea and chlamydia if:  You are sexually active and are younger than 53 years of age.  You are older than 53 years of age and your health care provider tells you that you are at risk for this type of infection.  Your sexual activity  has changed since you were last screened and you are at an increased risk for chlamydia or gonorrhea. Ask your health care provider if you are at risk.  If you do not have HIV, but are at risk, it may be recommended that you take a prescription medicine daily to prevent HIV infection. This is called pre-exposure prophylaxis (PrEP). You are considered at risk if:  You are sexually active and do not regularly use condoms or know the HIV status of your partner(s).  You take  drugs by injection.  You are sexually active with a partner who has HIV. Talk with your health care provider about whether you are at high risk of being infected with HIV. If you choose to begin PrEP, you should first be tested for HIV. You should then be tested every 3 months for as long as you are taking PrEP.  PREGNANCY   If you are premenopausal and you may become pregnant, ask your health care provider about preconception counseling.  If you may become pregnant, take 400 to 800 micrograms (mcg) of folic acid every day.  If you want to prevent pregnancy, talk to your health care provider about birth control (contraception). OSTEOPOROSIS AND MENOPAUSE   Osteoporosis is a disease in which the bones lose minerals and strength with aging. This can result in serious bone fractures. Your risk for osteoporosis can be identified using a bone density scan.  If you are 17 years of age or older, or if you are at risk for osteoporosis and fractures, ask your health care provider if you should be screened.  Ask your health care provider whether you should take a calcium or vitamin D supplement to lower your risk for osteoporosis.  Menopause may have certain physical symptoms and risks.  Hormone replacement therapy may reduce some of these symptoms and risks. Talk to your health care provider about whether hormone replacement therapy is right for you.  HOME CARE INSTRUCTIONS   Schedule regular health, dental, and eye  exams.  Stay current with your immunizations.   Do not use any tobacco products including cigarettes, chewing tobacco, or electronic cigarettes.  If you are pregnant, do not drink alcohol.  If you are breastfeeding, limit how much and how often you drink alcohol.  Limit alcohol intake to no more than 1 drink per day for nonpregnant women. One drink equals 12 ounces of beer, 5 ounces of wine, or 1 ounces of hard liquor.  Do not use street drugs.  Do not share needles.  Ask your health care provider for help if you need support or information about quitting drugs.  Tell your health care provider if you often feel depressed.  Tell your health care provider if you have ever been abused or do not feel safe at home.   This information is not intended to replace advice given to you by your health care provider. Make sure you discuss any questions you have with your health care provider.   Document Released: 04/22/2011 Document Revised: 10/28/2014 Document Reviewed: 09/08/2013 Elsevier Interactive Patient Education [employer].

## 2016-06-08 ENCOUNTER — Encounter: Payer: Self-pay | Admitting: Internal Medicine

## 2016-06-18 DIAGNOSIS — H5211 Myopia, right eye: Secondary | ICD-10-CM | POA: Diagnosis not present

## 2016-06-18 DIAGNOSIS — H52221 Regular astigmatism, right eye: Secondary | ICD-10-CM | POA: Diagnosis not present

## 2016-06-18 DIAGNOSIS — H524 Presbyopia: Secondary | ICD-10-CM | POA: Diagnosis not present

## 2016-06-25 MED FILL — DEXILANT DR 60 MG CAPSULE: 60 | 30 days supply | Qty: 30 | Fill #11

## 2016-07-01 MED FILL — BUTALB-ACETAMIN-CAFF 50-325: 50-325-40 | 18 days supply | Qty: 75 | Fill #3

## 2016-07-01 MED FILL — PREMARIN 0.625 MG TABLET: 0.625 | 30 days supply | Qty: 30 | Fill #5

## 2016-07-17 ENCOUNTER — Other Ambulatory Visit: Payer: Self-pay | Admitting: Internal Medicine

## 2016-07-17 MED ORDER — LINACLOTIDE 72 MCG PO CAPS: 72.0000 ug | ORAL_CAPSULE | Freq: Every day | ORAL | 5 refills | Status: DC

## 2016-07-17 MED FILL — DYMISTA NASAL SPRAY: 137-50 | 30 days supply | Qty: 23 | Fill #1

## 2016-07-17 MED FILL — LINZESS 72 MCG CAPSULE: 72 | 30 days supply | Qty: 30 | Fill #0

## 2016-07-17 MED FILL — ELETRIPTAN HBR 40 MG TABLET: 40 | 30 days supply | Qty: 9 | Fill #3

## 2016-07-20 ENCOUNTER — Telehealth: Payer: 59 | Admitting: Family

## 2016-07-20 DIAGNOSIS — N390 Urinary tract infection, site not specified: Secondary | ICD-10-CM

## 2016-07-20 MED ORDER — SULFAMETHOXAZOLE-TRIMETHOPRIM 800-160 MG PO TABS: 1.0000 | ORAL_TABLET | Freq: Two times a day (BID) | ORAL | 0 refills | Status: DC

## 2016-07-20 NOTE — Progress Notes (Signed)

## 2016-07-25 MED FILL — DEXILANT DR 60 MG CAPSULE: 60 | 90 days supply | Qty: 90 | Fill #0

## 2016-08-05 ENCOUNTER — Other Ambulatory Visit: Payer: Self-pay | Admitting: Obstetrics and Gynecology

## 2016-08-05 DIAGNOSIS — Z1231 Encounter for screening mammogram for malignant neoplasm of breast: Secondary | ICD-10-CM

## 2016-08-05 MED FILL — PREMARIN 0.625 MG TABLET: 0.625 | 30 days supply | Qty: 30 | Fill #6

## 2016-08-15 MED FILL — BUTALBITAL/APAP/CAFFEINE TB: 50-325-40 | 18 days supply | Qty: 75 | Fill #4

## 2016-08-15 MED FILL — ELETRIPTAN HBR 40 MG TABLET: 40 | 30 days supply | Qty: 9 | Fill #4

## 2016-08-19 ENCOUNTER — Ambulatory Visit
Admission: RE | Admit: 2016-08-19 | Discharge: 2016-08-19 | Disposition: A | Discharge status: Home / Self Care | Payer: 59 | Source: Ambulatory Visit | Attending: Obstetrics and Gynecology | Admitting: Obstetrics and Gynecology

## 2016-08-19 DIAGNOSIS — Z1231 Encounter for screening mammogram for malignant neoplasm of breast: Secondary | ICD-10-CM

## 2016-08-20 ENCOUNTER — Other Ambulatory Visit (INDEPENDENT_AMBULATORY_CARE_PROVIDER_SITE_OTHER): Payer: 59

## 2016-08-20 ENCOUNTER — Encounter: Payer: Self-pay | Admitting: Internal Medicine

## 2016-08-20 ENCOUNTER — Ambulatory Visit (INDEPENDENT_AMBULATORY_CARE_PROVIDER_SITE_OTHER): Payer: 59 | Admitting: Internal Medicine

## 2016-08-20 VITALS — BP 116/84 | HR 59 | Temp 97.8°F | Resp 16 | Ht 63.0 in | Wt 124.0 lb

## 2016-08-20 DIAGNOSIS — R1031 Right lower quadrant pain: Secondary | ICD-10-CM

## 2016-08-20 DIAGNOSIS — L290 Pruritus ani: Secondary | ICD-10-CM | POA: Diagnosis not present

## 2016-08-20 LAB — COMPREHENSIVE METABOLIC PANEL
ALT: 12 U/L (ref 0–35)
AST: 15 U/L (ref 0–37)
Albumin: 4.4 g/dL (ref 3.5–5.2)
Alkaline Phosphatase: 80 U/L (ref 39–117)
BUN: 8 mg/dL (ref 6–23)
CO2: 31 mEq/L (ref 19–32)
Calcium: 9.4 mg/dL (ref 8.4–10.5)
Chloride: 99 mEq/L (ref 96–112)
Creatinine, Ser: 0.89 mg/dL (ref 0.40–1.20)
GFR: 70.46 mL/min (ref >60.00)
Glucose, Bld: 81 mg/dL (ref 70–99)
Potassium: 4.3 mEq/L (ref 3.5–5.1)
Sodium: 136 mEq/L (ref 135–145)
Total Bilirubin: 0.5 mg/dL (ref 0.2–1.2)
Total Protein: 7.2 g/dL (ref 6.0–8.3)

## 2016-08-20 LAB — CBC WITH DIFFERENTIAL/PLATELET
Basophils Absolute: 0 10*3/uL (ref 0.0–0.1)
Basophils Relative: 0.8 % (ref 0.0–3.0)
Eosinophils Absolute: 0 10*3/uL (ref 0.0–0.7)
Eosinophils Relative: 1.2 % (ref 0.0–5.0)
HCT: 40.9 % (ref 36.0–46.0)
Hemoglobin: 14.2 g/dL (ref 12.0–15.0)
Lymphocytes Relative: 38.9 % (ref 12.0–46.0)
Lymphs Abs: 1.3 10*3/uL (ref 0.7–4.0)
MCHC: 34.7 g/dL (ref 30.0–36.0)
MCV: 94 fl (ref 78.0–100.0)
Monocytes Absolute: 0.3 10*3/uL (ref 0.1–1.0)
Monocytes Relative: 10.3 % (ref 3.0–12.0)
Neutro Abs: 1.6 10*3/uL (ref 1.4–7.7)
Neutrophils Relative %: 48.8 % (ref 43.0–77.0)
Platelets: 279 10*3/uL (ref 150.0–400.0)
RBC: 4.35 Mil/uL (ref 3.87–5.11)
RDW: 13.4 % (ref 11.5–15.5)
WBC: 3.3 10*3/uL — ABNORMAL LOW (ref 4.0–10.5)

## 2016-08-20 LAB — URINALYSIS, ROUTINE W REFLEX MICROSCOPIC
Bilirubin Urine: NEGATIVE
Hgb urine dipstick: NEGATIVE
Ketones, ur: NEGATIVE
Leukocytes, UA: NEGATIVE
Nitrite: NEGATIVE
RBC / HPF: NONE SEEN (ref 0–?)
Specific Gravity, Urine: <=1.005 — AB (ref 1.000–1.030)
Total Protein, Urine: NEGATIVE
Urine Glucose: NEGATIVE
Urobilinogen, UA: 0.2 (ref 0.0–1.0)
pH: 7 (ref 5.0–8.0)

## 2016-08-20 MED ORDER — HYDROCORTISONE 2.5 % RE CREA: 1.0000 "application " | TOPICAL_CREAM | Freq: Two times a day (BID) | RECTAL | 0 refills | Status: DC

## 2016-08-20 MED FILL — PROCTO-MED HC 2.5% CREAM: 2.5 | 10 days supply | Qty: 30 | Fill #0

## 2016-08-20 NOTE — Progress Notes (Signed)
Subjective:    Patient ID: Mindy [name], female    DOB: 06-30-1961, 55 y.o.   MRN: [id number]  HPI She is here for an acute visit.   RLQ pain:  She has had pain intermittently for two months.  In 2007 she had a fibroid and had a hysterectomy.  The overies were left.  She does have a history of ovarian cysts.   She does have pain daily or even weekly. She can feel sometimes when rolling over in bed.  It is not worse with exercising. The pain occ sharp and sometimes achy.  She denies changes in bowel movements, urination or.  Anal itching: She has been experiencing intermittent anal itching. She also feels something in the anal area and thinks hemorrhoid. Her symptoms are worse with exercise/when she is hot and after bowel movements. She has tried over-the-counter medications, preparation H and it was not effective. There have been no changes in bowel habits. Last colonoscopy was in 2015.    Medications and allergies reviewed with patient and updated if appropriate.  Patient Active Problem List   Diagnosis Date Noted  . GERD (gastroesophageal reflux disease) 06/05/2016  . Migraine 12/27/2015  . Headache 12/27/2015  . Lateral epicondylitis of left elbow 06/27/2015  . Cervical radiculitis 06/27/2015  . Epigastric pain 09/20/2013  . 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 acetaminophen (TYLENOL) 325 MG tablet Take 650 mg by mouth every 6 (six) hours as needed.    . butalbital-acetaminophen-caffeine (FIORICET, ESGIC) 50-325-40 MG tablet Take 1 tablet by mouth every 6 (six) hours as needed for headache. 75 tablet 5  . DEXILANT 60 MG capsule Take 60 mg by mouth daily.   12  . DYMISTA 137-50 MCG/ACT SUSP Place 2 sprays into both nostrils at bedtime. 23 g 11  . eletriptan (RELPAX) 40 MG tablet Take 1 tablet (40 mg total) by mouth as needed for migraine or headache. May repeat in 2 hours if  headache persists or recurs. 10 tablet 5  . estrogens, conjugated, (PREMARIN) 0.625 MG tablet Take 0.625 mg by mouth daily. Take daily for 21 days then do not take for 7 days.    Marland Kitchen LINZESS 145 MCG CAPS capsule Take 145 mcg by mouth every other day.   3  . Naproxen Sodium (ALEVE) 220 MG CAPS Take by mouth.    Marland Kitchen oxymetazoline (AFRIN) 0.05 % nasal spray Place 2 sprays into the nose at bedtime as needed for congestion.    . sodium chloride (OCEAN) 0.65 % nasal spray Place 1 spray into the nose 4 (four) times daily as needed for congestion (and dryness).    Marland Kitchen linaclotide (LINZESS) 72 MCG capsule Take 1 capsule (72 mcg total) by mouth daily before breakfast. (Patient not taking: Reported on 08/20/2016) 30 capsule 5   No current facility-administered medications on file prior to visit.     Past Medical History:  Diagnosis Date  . Allergic rhinitis   . Complication of anesthesia   . Elevated transaminase level 12.2014   ?etiology - present 09/2013 hosp for epigastric pain  . GERD (gastroesophageal reflux disease)   . Migraine   . PONV (postoperative nausea and vomiting)     Past Surgical History:  Procedure Laterality Date  . CHOLECYSTECTOMY N/A 09/21/2015   Procedure: LAPAROSCOPIC CHOLECYSTECTOMY;  Surgeon: Coralie Keens, MD;  Location: [place];  Service: General;  Laterality: N/A;  . NECK  SURGERY  2009  . PARTIAL HYSTERECTOMY  2007    Social History   Social History  . Marital status: Divorced    Spouse name: N/A  . Number of children: N/A  . Years of education: N/A   Occupational History  . RN    Social History Main Topics  . Smoking status: Never Smoker  . Smokeless tobacco: Never Used  . Alcohol use Yes     Comment: occ  . Drug use: No  . Sexual activity: Not on file   Other Topics Concern  . Not on file   Social History Narrative   Exercise: on average 3/week - running, weights sometimes    Family History  Problem Relation Age of Onset  . Atrial fibrillation  Father   . Diabetes type II Father   . Hypertension Father   . Coronary artery disease Father 61    stent x 2, AoVR  . Hypertension Mother   . Dementia Mother   . Hypertension Brother     Review of Systems  Constitutional: Negative for chills and fever.  Gastrointestinal: Positive for abdominal pain (RLQ) and nausea (occ). Negative for anal bleeding, blood in stool, constipation and diarrhea.       Anal itching intermittently x couple of months - worse with BM or when she gets hot(exercise).  She does feel something in the area and thinks she has hemorrhoids  Genitourinary: Negative for dysuria, frequency, hematuria, vaginal bleeding, vaginal discharge and vaginal pain.       Objective:   Vitals:   08/20/16 0803  BP: 116/84  Pulse: (!) 59  Resp: 16  Temp: 97.8 F (36.6 C)   Filed Weights   08/20/16 0803  Weight: 124 lb (56.2 kg)   Body mass index is 21.97 kg/m.   Physical Exam  Constitutional: She appears well-developed and well-nourished. No distress.  Abdominal: Soft. She exhibits no distension and no mass. There is tenderness (RLQ). There is no rebound and no guarding.  Genitourinary:  Genitourinary Comments: Rectal exam deferred  Musculoskeletal: She exhibits no edema.  Skin: She is not diaphoretic.          Assessment & Plan:   See Problem List for Assessment and Plan of chronic medical problems.

## 2016-08-20 NOTE — Progress Notes (Signed)
Pre visit review using our clinic review tool, if applicable. No additional management support is needed unless otherwise documented below in the visit note. 

## 2016-08-20 NOTE — Assessment & Plan Note (Signed)
Intermittent pain for approximately 2 months No changes in bowel habits or urination, no fevers She is tender on exam and her right lower quadrant She does have a history of ovarian cysts Check CBC, CMP, urinalysis and culture Pelvic and transvaginal ultrasound If the above is all normal we will pursue a CT scan

## 2016-08-20 NOTE — Assessment & Plan Note (Addendum)
Last colonoscopy 2015-normal Symptoms consistent with hemorrhoid We will try topical hydrocortisone 2.5% cream-use twice a day up to 14 days at a time Return if no improvement

## 2016-08-20 NOTE — Patient Instructions (Signed)
  Test(s) ordered today. Your results will be released to [place] (or called to you) after review, usually within 72hours after test completion. If any changes need to be made, you will be notified at that same time.   Medications reviewed and updated.  Changes include a cream for possible hemorrhoids.   Your prescription(s) have been submitted to your pharmacy. Please take as directed and contact our office if you believe you are having problem(s) with the medication(s).  An [place] was ordered - someone will call you to schedule this.

## 2016-08-21 ENCOUNTER — Other Ambulatory Visit: Payer: Self-pay | Admitting: Obstetrics and Gynecology

## 2016-08-21 ENCOUNTER — Encounter: Payer: Self-pay | Admitting: Internal Medicine

## 2016-08-21 DIAGNOSIS — R928 Other abnormal and inconclusive findings on diagnostic imaging of breast: Secondary | ICD-10-CM

## 2016-08-22 ENCOUNTER — Encounter: Payer: Self-pay | Admitting: Internal Medicine

## 2016-08-22 LAB — URINE CULTURE

## 2016-08-26 ENCOUNTER — Other Ambulatory Visit: Payer: Self-pay | Admitting: Obstetrics and Gynecology

## 2016-08-27 ENCOUNTER — Ambulatory Visit
Admission: RE | Admit: 2016-08-27 | Discharge: 2016-08-27 | Disposition: A | Discharge status: Home / Self Care | Payer: 59 | Source: Ambulatory Visit | Attending: Obstetrics and Gynecology | Admitting: Obstetrics and Gynecology

## 2016-08-27 ENCOUNTER — Other Ambulatory Visit: Payer: Self-pay | Admitting: Obstetrics and Gynecology

## 2016-08-27 DIAGNOSIS — R928 Other abnormal and inconclusive findings on diagnostic imaging of breast: Secondary | ICD-10-CM

## 2016-08-27 DIAGNOSIS — R922 Inconclusive mammogram: Secondary | ICD-10-CM | POA: Diagnosis not present

## 2016-08-27 DIAGNOSIS — N6489 Other specified disorders of breast: Secondary | ICD-10-CM | POA: Diagnosis not present

## 2016-08-28 ENCOUNTER — Ambulatory Visit
Admission: RE | Admit: 2016-08-28 | Discharge: 2016-08-28 | Disposition: A | Discharge status: Home / Self Care | Payer: 59 | Source: Ambulatory Visit | Attending: Obstetrics and Gynecology | Admitting: Obstetrics and Gynecology

## 2016-08-28 ENCOUNTER — Other Ambulatory Visit: Payer: Self-pay | Admitting: Obstetrics and Gynecology

## 2016-08-28 DIAGNOSIS — R928 Other abnormal and inconclusive findings on diagnostic imaging of breast: Secondary | ICD-10-CM

## 2016-08-28 DIAGNOSIS — N6489 Other specified disorders of breast: Secondary | ICD-10-CM | POA: Diagnosis not present

## 2016-08-28 DIAGNOSIS — N6091 Unspecified benign mammary dysplasia of right breast: Secondary | ICD-10-CM | POA: Diagnosis not present

## 2016-08-29 ENCOUNTER — Other Ambulatory Visit: Payer: Self-pay | Admitting: Internal Medicine

## 2016-08-29 DIAGNOSIS — R1031 Right lower quadrant pain: Secondary | ICD-10-CM

## 2016-09-05 MED FILL — PREMARIN 0.625 MG TABLET: 0.625 | 30 days supply | Qty: 30 | Fill #7

## 2016-09-06 ENCOUNTER — Other Ambulatory Visit: Payer: Self-pay | Admitting: Surgery

## 2016-09-06 DIAGNOSIS — N6021 Fibroadenosis of right breast: Secondary | ICD-10-CM | POA: Diagnosis not present

## 2016-09-18 ENCOUNTER — Other Ambulatory Visit: Payer: Self-pay | Admitting: Surgery

## 2016-09-18 DIAGNOSIS — N6021 Fibroadenosis of right breast: Secondary | ICD-10-CM

## 2016-09-26 MED FILL — BUTALBITAL/APAP/CAFFEINE TB: 50-325-40 | 18 days supply | Qty: 75 | Fill #5

## 2016-10-02 ENCOUNTER — Encounter (HOSPITAL_COMMUNITY)
Admission: RE | Admit: 2016-10-02 | Discharge: 2016-10-02 | Disposition: A | Discharge status: Home / Self Care | Payer: 59 | Source: Ambulatory Visit | Attending: Surgery | Admitting: Surgery

## 2016-10-02 ENCOUNTER — Encounter (HOSPITAL_COMMUNITY): Payer: Self-pay

## 2016-10-02 DIAGNOSIS — N6021 Fibroadenosis of right breast: Secondary | ICD-10-CM

## 2016-10-02 DIAGNOSIS — Z01818 Encounter for other preprocedural examination: Secondary | ICD-10-CM | POA: Diagnosis not present

## 2016-10-02 HISTORY — DX: Family history of other specified conditions: Z84.89

## 2016-10-02 HISTORY — DX: Personal history of other diseases of the respiratory system: Z87.09

## 2016-10-02 HISTORY — DX: Other seasonal allergic rhinitis: J30.2

## 2016-10-02 LAB — CBC
HCT: 39.2 % (ref 36.0–46.0)
Hemoglobin: 13.5 g/dL (ref 12.0–15.0)
MCH: 32.4 pg (ref 26.0–34.0)
MCHC: 34.4 g/dL (ref 30.0–36.0)
MCV: 94 fL (ref 78.0–100.0)
Platelets: 278 10*3/uL (ref 150–400)
RBC: 4.17 MIL/uL (ref 3.87–5.11)
RDW: 12.1 % (ref 11.5–15.5)
WBC: 4.6 10*3/uL (ref 4.0–10.5)

## 2016-10-02 NOTE — Pre-Procedure Instructions (Signed)
Mindy [name]  10/02/2016      [place], [place] - [address] [facility]. [address] [place] [address] Phone: [phone] Fax: [phone]  [id number] - [place], [place] [address] Phone: [phone] Fax: [phone]  [facility] - [place], [place] - [address] AT [place] [address] [place] [address] Phone: [phone] Fax: [phone]    Your procedure is scheduled on Tuesday, December 19th, 2017.  Report to [facility] Admitting at 8:15 A.M.   Call this number if you have problems the morning of surgery:  [phone]   Remember:  Do not eat food or drink liquids after midnight.  Please drink Boost Breeze by 6:15 AM the morning of surgery.  Boost Breeze may be stored in refrigerator but do not pour over ice.  You may rinse your mouth with water after drinking but please do not swallow any water.     Take these medicines the morning of surgery with A SIP OF WATER: Dexilant, Nasal Spray as needed.    Stop taking: Naproxen Sodium (Aleve), Pseudoephedrine (Sudafed), Aspirin, NSAIDS, Ibuprofen, Advil, Motrin, BC's, Goody's, Fish oil, all herbal medications, and all vitamins.    Do not wear jewelry, make-up or nail polish.  Do not wear lotions, powders, or perfumes, or deoderant.  Do not shave 48 hours prior to surgery.    Do not bring valuables to the hospital.  [facility] is not responsible for any belongings or valuables.  Contacts, dentures or bridgework may not be worn into surgery.  Leave your suitcase in the car.  After surgery it may be brought to your room.  For patients admitted to the hospital, discharge time will be determined by your treatment team.  Patients discharged the day of surgery will not be allowed to drive home.   Special instructions:  Preparing for Surgery.   [facility]- Preparing For Surgery  Before surgery, you can play an important role. Because skin is not sterile, your skin needs to be as free of germs as possible. You can reduce the number of germs on your skin by washing with CHG (chlorahexidine gluconate) Soap before surgery.  CHG is an antiseptic cleaner which kills germs and bonds with the skin to continue killing germs even after washing.  Please do not use if you have an allergy to CHG or antibacterial soaps. If your skin becomes reddened/irritated stop using the CHG.  Do not shave (including legs and underarms) for at least 48 hours prior to first CHG shower. It is OK to shave your face.  Please follow these instructions carefully.   1. Shower the NIGHT BEFORE SURGERY and the MORNING OF SURGERY with CHG.   2. If you chose to wash your hair, wash your hair first as usual with your normal shampoo.  3. After you shampoo, rinse your hair and body thoroughly to remove the shampoo.  4. Use CHG as you would any other liquid soap. You can apply CHG directly to the skin and wash gently with a scrungie or a clean washcloth.   5. Apply the CHG Soap to your body ONLY FROM THE NECK DOWN.  Do not use on open wounds or open sores. Avoid contact with your eyes, ears, mouth and genitals (private parts). Wash genitals (private parts) with your normal soap.  6. Wash thoroughly, paying special  attention to the area where your surgery will be performed.  7. Thoroughly rinse your body with warm water from the neck down.  8. DO NOT shower/wash with your normal soap after using and rinsing off the CHG Soap.  9. Pat yourself dry with a CLEAN TOWEL.   10. Wear CLEAN PAJAMAS   11. Place CLEAN SHEETS on your bed the night of your first shower and DO NOT SLEEP WITH PETS.  Day of Surgery: Do not apply any deodorants/lotions. Please wear clean clothes to the hospital/surgery center.      Please read over the following fact sheets that you were given.

## 2016-10-03 ENCOUNTER — Ambulatory Visit (INDEPENDENT_AMBULATORY_CARE_PROVIDER_SITE_OTHER): Payer: 59 | Admitting: Internal Medicine

## 2016-10-03 ENCOUNTER — Encounter: Payer: Self-pay | Admitting: Internal Medicine

## 2016-10-03 DIAGNOSIS — J300 Vasomotor rhinitis: Secondary | ICD-10-CM | POA: Diagnosis not present

## 2016-10-03 DIAGNOSIS — G43009 Migraine without aura, not intractable, without status migrainosus: Secondary | ICD-10-CM | POA: Diagnosis not present

## 2016-10-03 MED ORDER — BUTALBITAL-APAP-CAFFEINE 50-325-40 MG PO TABS: 1.0000 | ORAL_TABLET | Freq: Four times a day (QID) | ORAL | 5 refills | Status: DC | PRN

## 2016-10-03 NOTE — Patient Instructions (Signed)
Fioricet refilled. Call for other refills as needed.  Please call if we can help

## 2016-10-03 NOTE — Progress Notes (Signed)
Patient ID: Mindy [name], female    DOB: 23-Jun-1961, 55 y.o.   MRN: [id number]  HPI female never smoker followed for chronic rhinitis/sinusitis, complicated by headache Allergy profile 12/27/13- Neg, Total IgE 4.5 Had failed allergy vaccine Eos not elevated 09/12/14 Sinus xray 12/12/2015-no evidence of acute sinusitis, clear -------------------------------------------------------------------  03/25/2016-55 year old female never smoker followed for chronic rhinitis/sinusitis, complicated by headache FOLLOWS FOR: Pt states she has been slightly stuffy and sneezing for about past week. She uses Sudafed, occasional Afrin, saline nasal spray. Not much use for antihistamines. Stuffiness is a problem but drainage is not. No cough or wheeze. We discussed study describing anti-inflammatory effect of verapamil on rhinitis. Her blood pressure tends to run a little low. She is satisfied with status quo.  10/03/2016-55 year old female never smoker followed for chronic rhinitis/sinusitis, complicated by headache, breast cancer FOLLOWS FOR: Sinus pressure, dry hacky cough started over the weekend-treating the symptoms. Otherwise allergies have been doing good. Sinus xray 12/12/2015-no evidence of acute sinusitis, clear  Using nasal saline spray and occasional Afrin. Still uses Dymista nasal spray at bedtime. We discussed lab findings from past evaluations not showing active allergy component or obvious active acute sinus disease.  Review of Systems- see HPI Constitutional:   No-   weight loss, night sweats, fevers, chills, fatigue, lassitude. HEENT:   +  headaches,  No-difficulty swallowing, tooth/dental problems, sore throat,       sneezing, itching, ear ache, +nasal congestion, post nasal drip,  CV:  chest pain, orthopnea, PND, swelling in lower extremities, anasarca, dizziness, palpitations Resp: +shortness of breath with exertion or at rest.              No-   productive cough, + non-productive  cough,  No-  coughing up of blood.              No-   change in color of mucus.  No- wheezing.   Skin: No-   rash or lesions. GI:  No-   heartburn, indigestion, abdominal pain, nausea, vomiting,  GU:  MS:  No-   joint pain or swelling.   Neuro- nothing unusual Psych:  No- change in mood or affect. No depression or anxiety.  No memory loss.  Objective:   Physical Exam General- Alert, Oriented, Affect-appropriate, Distress- none Skin- rash-none, lesions- none, excoriation- none Lymphadenopathy- none Head- atraumatic            Eyes- Gross vision intact, PERRLA, conjunctivae clear secretions            Ears- Hearing, canals            Nose- , +turbinate edema, +Septal dev, no-mucus,  No-polyps, erosion, perforation. +periorbital edema, + frequent sniffing with no visible drainage            Throat- Mallampati III-IV , mucosa clear , drainage- none, tonsils- atrophic Neck- flexible , trachea midline, no stridor , thyroid nl, carotid no bruit Chest - symmetrical excursion , unlabored           Heart/CV- RRR , no murmur , no gallop  , no rub, nl s1 s2                           - JVD- none , edema- none, stasis changes- none, varices- none           Lung-  wheeze -none, cough-none , dullness-none, rub- none  Chest wall-  Abd-  Br/ Gen/ Rectal- Not done, not indicated Extrem- cyanosis- none, clubbing, none, atrophy- none, strength- nl Neuro- grossly intact to observation

## 2016-10-07 ENCOUNTER — Ambulatory Visit
Admission: RE | Admit: 2016-10-07 | Discharge: 2016-10-07 | Disposition: A | Discharge status: Home / Self Care | Payer: 59 | Source: Ambulatory Visit | Attending: Surgery | Admitting: Surgery

## 2016-10-07 DIAGNOSIS — R928 Other abnormal and inconclusive findings on diagnostic imaging of breast: Secondary | ICD-10-CM | POA: Diagnosis not present

## 2016-10-07 DIAGNOSIS — N6021 Fibroadenosis of right breast: Secondary | ICD-10-CM

## 2016-10-07 MED FILL — PREMARIN 0.625 MG TABLET: 0.625 | 30 days supply | Qty: 30 | Fill #8

## 2016-10-07 NOTE — H&P (Signed)
  Guillermo A. Stephanie Coup  Location: [facility] Patient #: [id number] DOB: [birth year] Divorced / Language: Cleophus Molt / Race: White Female   History of Present Illness  The patient is a 55 year old female who presents with a complaint of Breast problems. This is a patient might who I have operated on in the past for a cholecystectomy. She was found recently on screening mammography to have an area of distortion in the right breast. She has since had a stereotactic biopsy showing a complex sclerosing lesion. There were no other abdomen is on the field. She has had no previous breast biopsies. She has had no nipple discharge. She does have a family history of breast cancer in both grandmothers. She is otherwise healthy and has had no change in her medical condition since I saw her last   Allergies Oxaprozin *ANALGESICS - ANTI-INFLAMMATORY*  Daypro *ANALGESICS - ANTI-INFLAMMATORY*   Medication History  Dexilant (60MG  Capsule DR, Oral) Active. Linzess (72MCG Capsule, Oral) Active. Premarin (0.625MG  Tablet, Oral) Active. Tylenol (325MG  Tablet, Oral) Active. Butalbital-APAP-Caffeine (50-325-40MG  Tablet, Oral) Active. Dymista (137-50MCG/ACT Suspension, Nasal) Active. Relpax (40MG  Tablet, Oral) Active. Medications Reconciled  Vitals   Weight: 124 lb Height: 64in Body Surface Area: 1.6 m Body Mass Index: 21.28 kg/m  Temp.: 98.45F  Pulse: 67 (Regular)  BP: 118/80 (Sitting, Left Arm, Standard     Physical Exam The physical exam findings are as follows: Note:Today she is well appearance. Lungs are clear bilaterally Cardiovascular regular rate and rhythm There is ecchymosis and a small hematoma of the right breast but no other palpable lesions and no axillary adenopathy on the right Skin without rash Abdomen soft non  I have reviewed the pathology showing a complex sclerosing lesion. I also reviewed her mammograms and ultrasound    Assessment &  Plan  SCLEROSING ADENOSIS OF BREAST, RIGHT (N60.21)  Impression: I have discussed the diagnosis of the right breast complex sclerosing lesion with her and her daughter. A radioactive seed right breast lumpectomy is recommended for histologic evaluation for malignancy. I discussed the reasoning for this with her. I discussed the surgical procedure in detail. I discussed the risk of surgery which includes but is not limited to bleeding, infection, need for further surgery if malignancy is identified, injury to surrounding structures, postoperative recovery, etc. She understands and wishes to proceed with surgery which will be scheduled

## 2016-10-08 ENCOUNTER — Encounter (HOSPITAL_COMMUNITY): Payer: Self-pay | Admitting: *Deleted

## 2016-10-08 ENCOUNTER — Ambulatory Visit (HOSPITAL_COMMUNITY): Payer: 59 | Admitting: Certified Registered Nurse Anesthetist

## 2016-10-08 ENCOUNTER — Ambulatory Visit (HOSPITAL_COMMUNITY)
Admission: RE | Admit: 2016-10-08 | Discharge: 2016-10-08 | Disposition: A | Discharge status: Home / Self Care | Payer: 59 | Source: Ambulatory Visit | Attending: Surgery | Admitting: Surgery

## 2016-10-08 ENCOUNTER — Ambulatory Visit
Admission: RE | Admit: 2016-10-08 | Discharge: 2016-10-08 | Disposition: A | Discharge status: Home / Self Care | Payer: 59 | Source: Ambulatory Visit | Attending: Surgery | Admitting: Surgery

## 2016-10-08 ENCOUNTER — Encounter (HOSPITAL_COMMUNITY)
Admission: RE | Disposition: A | Discharge status: Home / Self Care | Payer: Self-pay | Source: Ambulatory Visit | Attending: Surgery

## 2016-10-08 DIAGNOSIS — N6021 Fibroadenosis of right breast: Secondary | ICD-10-CM

## 2016-10-08 DIAGNOSIS — N6081 Other benign mammary dysplasias of right breast: Secondary | ICD-10-CM | POA: Diagnosis not present

## 2016-10-08 DIAGNOSIS — Z79899 Other long term (current) drug therapy: Secondary | ICD-10-CM | POA: Insufficient documentation

## 2016-10-08 DIAGNOSIS — Z803 Family history of malignant neoplasm of breast: Secondary | ICD-10-CM | POA: Insufficient documentation

## 2016-10-08 DIAGNOSIS — R928 Other abnormal and inconclusive findings on diagnostic imaging of breast: Secondary | ICD-10-CM | POA: Diagnosis not present

## 2016-10-08 DIAGNOSIS — Z79890 Hormone replacement therapy (postmenopausal): Secondary | ICD-10-CM | POA: Diagnosis not present

## 2016-10-08 DIAGNOSIS — N6489 Other specified disorders of breast: Secondary | ICD-10-CM | POA: Insufficient documentation

## 2016-10-08 DIAGNOSIS — D241 Benign neoplasm of right breast: Secondary | ICD-10-CM | POA: Diagnosis not present

## 2016-10-08 DIAGNOSIS — N6011 Diffuse cystic mastopathy of right breast: Secondary | ICD-10-CM | POA: Diagnosis not present

## 2016-10-08 DIAGNOSIS — R921 Mammographic calcification found on diagnostic imaging of breast: Secondary | ICD-10-CM | POA: Diagnosis not present

## 2016-10-08 HISTORY — PX: BREAST LUMPECTOMY WITH RADIOACTIVE SEED LOCALIZATION: SHX6424

## 2016-10-08 SURGERY — BREAST LUMPECTOMY WITH RADIOACTIVE SEED LOCALIZATION
Anesthesia: General | Site: Breast | Laterality: Right

## 2016-10-08 MED ORDER — LIDOCAINE 2% (20 MG/ML) 5 ML SYRINGE
INTRAMUSCULAR | Status: AC
  Filled 2016-10-08: qty 5

## 2016-10-08 MED ORDER — ACETAMINOPHEN 650 MG RE SUPP: 650.0000 mg | RECTAL | Status: DC | PRN

## 2016-10-08 MED ORDER — FENTANYL CITRATE (PF) 100 MCG/2ML IJ SOLN
25.0000 ug | INTRAMUSCULAR | Status: DC | PRN
  Administered 2016-10-08 (×2): 50 ug via INTRAVENOUS

## 2016-10-08 MED ORDER — SCOPOLAMINE 1 MG/3DAYS TD PT72
MEDICATED_PATCH | TRANSDERMAL | Status: AC
  Filled 2016-10-08: qty 1

## 2016-10-08 MED ORDER — CHLORHEXIDINE GLUCONATE CLOTH 2 % EX PADS: 6.0000 | MEDICATED_PAD | Freq: Once | CUTANEOUS | Status: DC

## 2016-10-08 MED ORDER — MORPHINE SULFATE (PF) 2 MG/ML IV SOLN: 1.0000 mg | INTRAVENOUS | Status: DC | PRN

## 2016-10-08 MED ORDER — MIDAZOLAM HCL 5 MG/5ML IJ SOLN
INTRAMUSCULAR | Status: DC | PRN
  Administered 2016-10-08: 2 mg via INTRAVENOUS

## 2016-10-08 MED ORDER — LACTATED RINGERS IV SOLN
INTRAVENOUS | Status: DC | PRN
  Administered 2016-10-08 (×2): via INTRAVENOUS

## 2016-10-08 MED ORDER — HYDROMORPHONE HCL 1 MG/ML IJ SOLN
0.2500 mg | INTRAMUSCULAR | Status: DC | PRN
Start: 2016-10-08 — End: 2016-10-08

## 2016-10-08 MED ORDER — ONDANSETRON HCL 4 MG/2ML IJ SOLN
INTRAMUSCULAR | Status: DC | PRN
  Administered 2016-10-08: 4 mg via INTRAVENOUS

## 2016-10-08 MED ORDER — FENTANYL CITRATE (PF) 100 MCG/2ML IJ SOLN
INTRAMUSCULAR | Status: AC
  Filled 2016-10-08: qty 4

## 2016-10-08 MED ORDER — PROPOFOL 10 MG/ML IV BOLUS
INTRAVENOUS | Status: DC | PRN
  Administered 2016-10-08: 150 mg via INTRAVENOUS
  Administered 2016-10-08: 50 mg via INTRAVENOUS

## 2016-10-08 MED ORDER — ONDANSETRON HCL 4 MG PO TABS: 4.0000 mg | ORAL_TABLET | Freq: Three times a day (TID) | ORAL | 1 refills | Status: DC | PRN

## 2016-10-08 MED ORDER — ONDANSETRON HCL 4 MG/2ML IJ SOLN: 4.0000 mg | Freq: Once | INTRAMUSCULAR | Status: DC | PRN

## 2016-10-08 MED ORDER — SCOPOLAMINE 1 MG/3DAYS TD PT72
MEDICATED_PATCH | TRANSDERMAL | Status: DC | PRN
  Administered 2016-10-08: 1 via TRANSDERMAL

## 2016-10-08 MED ORDER — ACETAMINOPHEN 160 MG/5ML PO SOLN
325.0000 mg | ORAL | Status: DC | PRN
  Filled 2016-10-08: qty 20.3

## 2016-10-08 MED ORDER — EPHEDRINE SULFATE 50 MG/ML IJ SOLN
INTRAMUSCULAR | Status: DC | PRN
  Administered 2016-10-08: 25 mg via INTRAVENOUS

## 2016-10-08 MED ORDER — OXYCODONE HCL 5 MG PO TABS: 5.0000 mg | ORAL_TABLET | Freq: Once | ORAL | Status: DC | PRN

## 2016-10-08 MED ORDER — HYDROCODONE-ACETAMINOPHEN 5-325 MG PO TABS: 1.0000 | ORAL_TABLET | ORAL | 0 refills | Status: DC | PRN

## 2016-10-08 MED ORDER — DEXAMETHASONE SODIUM PHOSPHATE 10 MG/ML IJ SOLN
INTRAMUSCULAR | Status: DC | PRN
  Administered 2016-10-08: 10 mg via INTRAVENOUS

## 2016-10-08 MED ORDER — LIDOCAINE 2% (20 MG/ML) 5 ML SYRINGE
INTRAMUSCULAR | Status: DC | PRN
  Administered 2016-10-08: 100 mg via INTRAVENOUS

## 2016-10-08 MED ORDER — CEFAZOLIN SODIUM-DEXTROSE 2-4 GM/100ML-% IV SOLN
INTRAVENOUS | Status: AC
  Filled 2016-10-08: qty 100

## 2016-10-08 MED ORDER — DEXAMETHASONE SODIUM PHOSPHATE 10 MG/ML IJ SOLN
INTRAMUSCULAR | Status: AC
  Filled 2016-10-08: qty 1

## 2016-10-08 MED ORDER — SODIUM CHLORIDE 0.9% FLUSH: 3.0000 mL | Freq: Two times a day (BID) | INTRAVENOUS | Status: DC

## 2016-10-08 MED ORDER — ACETAMINOPHEN 325 MG PO TABS: 325.0000 mg | ORAL_TABLET | ORAL | Status: DC | PRN

## 2016-10-08 MED ORDER — BUPIVACAINE-EPINEPHRINE (PF) 0.25% -1:200000 IJ SOLN
INTRAMUSCULAR | Status: AC
  Filled 2016-10-08: qty 30

## 2016-10-08 MED ORDER — FENTANYL CITRATE (PF) 100 MCG/2ML IJ SOLN
INTRAMUSCULAR | Status: DC | PRN
  Administered 2016-10-08: 50 ug via INTRAVENOUS

## 2016-10-08 MED ORDER — ONDANSETRON HCL 4 MG/2ML IJ SOLN
INTRAMUSCULAR | Status: AC
  Filled 2016-10-08: qty 2

## 2016-10-08 MED ORDER — OXYCODONE HCL 5 MG PO TABS: 5.0000 mg | ORAL_TABLET | ORAL | Status: DC | PRN

## 2016-10-08 MED ORDER — MIDAZOLAM HCL 2 MG/2ML IJ SOLN
INTRAMUSCULAR | Status: AC
  Filled 2016-10-08: qty 2

## 2016-10-08 MED ORDER — SODIUM CHLORIDE 0.9 % IV SOLN: 250.0000 mL | INTRAVENOUS | Status: DC | PRN

## 2016-10-08 MED ORDER — SODIUM CHLORIDE 0.9% FLUSH: 3.0000 mL | INTRAVENOUS | Status: DC | PRN

## 2016-10-08 MED ORDER — CEFAZOLIN SODIUM-DEXTROSE 2-4 GM/100ML-% IV SOLN
2.0000 g | INTRAVENOUS | Status: AC
  Administered 2016-10-08: 2 g via INTRAVENOUS

## 2016-10-08 MED ORDER — 0.9 % SODIUM CHLORIDE (POUR BTL) OPTIME
TOPICAL | Status: DC | PRN
  Administered 2016-10-08: 1000 mL

## 2016-10-08 MED ORDER — ACETAMINOPHEN 325 MG PO TABS: 650.0000 mg | ORAL_TABLET | ORAL | Status: DC | PRN

## 2016-10-08 MED ORDER — OXYCODONE HCL 5 MG/5ML PO SOLN: 5.0000 mg | Freq: Once | ORAL | Status: DC | PRN

## 2016-10-08 MED ORDER — MEPERIDINE HCL 25 MG/ML IJ SOLN: 6.2500 mg | INTRAMUSCULAR | Status: DC | PRN

## 2016-10-08 MED ORDER — BUPIVACAINE-EPINEPHRINE 0.25% -1:200000 IJ SOLN
INTRAMUSCULAR | Status: DC | PRN
  Administered 2016-10-08: 15 mL

## 2016-10-08 MED ORDER — PROPOFOL 10 MG/ML IV BOLUS
INTRAVENOUS | Status: AC
  Filled 2016-10-08: qty 20

## 2016-10-08 MED ORDER — FENTANYL CITRATE (PF) 100 MCG/2ML IJ SOLN
INTRAMUSCULAR | Status: AC
  Filled 2016-10-08: qty 2

## 2016-10-08 MED FILL — HYDROCODON-APAP 5-325: 5-325 | 3 days supply | Qty: 40 | Fill #0

## 2016-10-08 MED FILL — ONDANSETRON HCL 4 MG TABLET: 4 | 7 days supply | Qty: 20 | Fill #0

## 2016-10-08 SURGICAL SUPPLY — 41 items
ADH SKN CLS APL DERMABOND .7 (GAUZE/BANDAGES/DRESSINGS) ×1
APPLIER CLIP 9.375 MED OPEN (MISCELLANEOUS) ×2
APR CLP MED 9.3 20 MLT OPN (MISCELLANEOUS) ×1
BINDER BREAST LRG (GAUZE/BANDAGES/DRESSINGS) ×1 IMPLANT
BINDER BREAST XLRG (GAUZE/BANDAGES/DRESSINGS) IMPLANT
BLADE SURG 15 STRL LF DISP TIS (BLADE) ×1 IMPLANT
BLADE SURG 15 STRL SS (BLADE) ×2
CANISTER SUCTION 2500CC (MISCELLANEOUS) ×2 IMPLANT
CHLORAPREP W/TINT 26ML (MISCELLANEOUS) ×2 IMPLANT
CLIP APPLIE 9.375 MED OPEN (MISCELLANEOUS) ×1 IMPLANT
COVER PROBE W GEL 5X96 (DRAPES) ×2 IMPLANT
COVER SURGICAL LIGHT HANDLE (MISCELLANEOUS) ×2 IMPLANT
DERMABOND ADVANCED (GAUZE/BANDAGES/DRESSINGS) ×1
DERMABOND ADVANCED .7 DNX12 (GAUZE/BANDAGES/DRESSINGS) ×1 IMPLANT
DEVICE DUBIN SPECIMEN MAMMOGRA (MISCELLANEOUS) ×2 IMPLANT
DRAPE CHEST BREAST 15X10 FENES (DRAPES) ×2 IMPLANT
DRAPE UTILITY XL STRL (DRAPES) ×2 IMPLANT
ELECT CAUTERY BLADE 6.4 (BLADE) ×2 IMPLANT
ELECT REM PT RETURN 9FT ADLT (ELECTROSURGICAL) ×2
ELECTRODE REM PT RTRN 9FT ADLT (ELECTROSURGICAL) ×1 IMPLANT
GLOVE SURG SIGNA 7.5 PF LTX (GLOVE) ×2 IMPLANT
GOWN STRL REUS W/ TWL LRG LVL3 (GOWN DISPOSABLE) ×1 IMPLANT
GOWN STRL REUS W/ TWL XL LVL3 (GOWN DISPOSABLE) ×1 IMPLANT
GOWN STRL REUS W/TWL LRG LVL3 (GOWN DISPOSABLE) ×2
GOWN STRL REUS W/TWL XL LVL3 (GOWN DISPOSABLE) ×2
KIT BASIN OR (CUSTOM PROCEDURE TRAY) ×2 IMPLANT
KIT MARKER MARGIN INK (KITS) ×2 IMPLANT
NDL HYPO 25X1 1.5 SAFETY (NEEDLE) ×1 IMPLANT
NEEDLE HYPO 25X1 1.5 SAFETY (NEEDLE) ×2 IMPLANT
NS IRRIG 1000ML POUR BTL (IV SOLUTION) IMPLANT
PACK SURGICAL SETUP 50X90 (CUSTOM PROCEDURE TRAY) ×2 IMPLANT
PENCIL BUTTON HOLSTER BLD 10FT (ELECTRODE) ×2 IMPLANT
SPONGE LAP 18X18 X RAY DECT (DISPOSABLE) ×2 IMPLANT
SUT MNCRL AB 4-0 PS2 18 (SUTURE) ×2 IMPLANT
SUT VIC AB 3-0 SH 18 (SUTURE) ×2 IMPLANT
SYR BULB 3OZ (MISCELLANEOUS) ×2 IMPLANT
SYR CONTROL 10ML LL (SYRINGE) ×2 IMPLANT
TOWEL OR 17X24 6PK STRL BLUE (TOWEL DISPOSABLE) ×1 IMPLANT
TOWEL OR 17X26 10 PK STRL BLUE (TOWEL DISPOSABLE) ×2 IMPLANT
TUBE CONNECTING 12X1/4 (SUCTIONS) ×2 IMPLANT
YANKAUER SUCT BULB TIP NO VENT (SUCTIONS) ×2 IMPLANT

## 2016-10-08 NOTE — Op Note (Signed)
RIGHT BREAST LUMPECTOMY WITH RADIOACTIVE SEED LOCALIZATION  Procedure Note  Mindy [name] 10/08/2016   Pre-op Diagnosis: COMPLEX SCLEROSING LESION OF RIGHT BREAST     Post-op Diagnosis: same  Procedure(s): RIGHT BREAST LUMPECTOMY WITH RADIOACTIVE SEED LOCALIZATION  Surgeon(s): Coralie Keens, MD  Anesthesia: General  Staff:  Circulator: Rosanne Sack, RN Scrub Person: Antony Madura Barrett Circulator Assistant: Harrel Lemon, RN  Estimated Blood Loss: 10 cc               Specimens: sent to path  Indications: This patient was found to have an abnormality in the right breast in the upper-outer quadrant on screening mammography. Stereotactic biopsy was performed showing [name] complex sclerosing lesion. The decision was made to proceed with [name] radioactive seed localized right breast lumpectomy.  Procedure: The patient was brought to the operating room and identified as the correct patient. She was placed supine on the operating room table and general anesthesia was induced. Her right breast was then prepped and draped in usual sterile fashion. The neoprobe was brought onto the field. Identified an area of increased uptake in the upper outer quadrant of the right breast. I then anesthetized the upper edge of the areola with Marcaine. I made [name] circumareolar incision with scalpel. I then took this down to the breast tissue with electrocautery. I then tunneled up into the right upper quadrant of the breast with the neoprobe was [name] perform [name] lumpectomy around the radioactive seed with the cautery. Once the specimen was removed I marked all quadrants with marker pain. We then x-rayed the specimen and confirmed that the radioactive seed and previous placed marker were in the breast. The specimen was then sent to pathology for evaluation. I anesthetized the lumpectomy cavity further with Marcaine. I achieved him status with the cautery. I then placed several surgical clips into the  biopsy cavity. I think close the subcutaneous tissue with interrupted 3-0 Vicryl sutures and closed the skin with [name] running 4-0 Monocryl. Skin glue was then applied. The patient tolerated procedure well. All counts were correct at the end of procedure. The patient was then extubated in the operating room and taken in [name] stable condition to the recovery room          Mindy [name]   Date: 10/08/2016  Time: 10:12 AM

## 2016-10-08 NOTE — Anesthesia Procedure Notes (Signed)
Procedure Name: LMA Insertion Date/Time: 10/08/2016 9:34 AM Performed by: Judeth Cornfield T Pre-anesthesia Checklist: Patient identified, Emergency Drugs available, Suction available, Patient being monitored and Timeout performed Patient Re-evaluated:Patient Re-evaluated prior to inductionOxygen Delivery Method: Circle system utilized Preoxygenation: Pre-oxygenation with 100% oxygen Intubation Type: IV induction Ventilation: Mask ventilation without difficulty LMA: LMA inserted and LMA flexible inserted LMA Size: 4.0 Number of attempts: 1 Placement Confirmation: positive ETCO2,  CO2 detector and breath sounds checked- equal and bilateral Tube secured with: Tape Dental Injury: Teeth and Oropharynx as per pre-operative assessment

## 2016-10-08 NOTE — Anesthesia Postprocedure Evaluation (Signed)
Anesthesia Post Note  Patient: Mindy [name]  Procedure(s) Performed: Procedure(s) (LRB): RIGHT BREAST LUMPECTOMY WITH RADIOACTIVE SEED LOCALIZATION (Right)  Patient location during evaluation: PACU Anesthesia Type: General Level of consciousness: awake Pain management: pain level controlled Vital Signs Assessment: post-procedure vital signs reviewed and stable Respiratory status: spontaneous breathing Cardiovascular status: stable Postop Assessment: no signs of nausea or vomiting Anesthetic complications: no        Last Vitals:  Vitals:   10/08/16 1045 10/08/16 1048  BP:  126/85  Pulse: 85 74  Resp: (!) 22 15  Temp:      Last Pain:  Vitals:   10/08/16 1045  TempSrc:   PainSc: 3    Pain Goal:                 Harvis Mabus JR,JOHN Danyella Mcginty

## 2016-10-08 NOTE — Interval H&P Note (Signed)
History and Physical Interval Note: no change in H and P  10/08/2016 8:16 AM  Mindy [name]  has presented today for surgery, with the diagnosis of COMPLEX SCLEROSING LESION OF RIGHT BREAST  The various methods of treatment have been discussed with the patient and family. After consideration of risks, benefits and other options for treatment, the patient has consented to  Procedure(s): RIGHT BREAST LUMPECTOMY WITH RADIOACTIVE SEED LOCALIZATION (Right) as a surgical intervention .  The patient's history has been reviewed, patient examined, no change in status, stable for surgery.  I have reviewed the patient's chart and labs.  Questions were answered to the patient's satisfaction.     [name] A

## 2016-10-08 NOTE — Discharge Instructions (Signed)
[place] Office Phone Number [phone]  BREAST BIOPSY/ PARTIAL MASTECTOMY: POST OP INSTRUCTIONS  Always review your discharge instruction sheet given to you by the facility where your surgery was performed.  IF YOU HAVE DISABILITY OR FAMILY LEAVE FORMS, YOU MUST BRING THEM TO THE OFFICE FOR PROCESSING.  DO NOT GIVE THEM TO YOUR DOCTOR.  1. A prescription for pain medication may be given to you upon discharge.  Take your pain medication as prescribed, if needed.  If narcotic pain medicine is not needed, then you may take acetaminophen (Tylenol) or ibuprofen (Advil) as needed. 2. Take your usually prescribed medications unless otherwise directed 3. If you need a refill on your pain medication, please contact your pharmacy.  They will contact our office to request authorization.  Prescriptions will not be filled after 5pm or on week-ends. 4. You should eat very light the first 24 hours after surgery, such as soup, crackers, pudding, etc.  Resume your normal diet the day after surgery. 5. Most patients will experience some swelling and bruising in the breast.  Ice packs and a good support bra will help.  Swelling and bruising can take several days to resolve.  6. It is common to experience some constipation if taking pain medication after surgery.  Increasing fluid intake and taking a stool softener will usually help or prevent this problem from occurring.  A mild laxative (Milk of Magnesia or Miralax) should be taken according to package directions if there are no bowel movements after 48 hours. 7. Unless discharge instructions indicate otherwise, you may remove your bandages 24-48 hours after surgery, and you may shower at that time.  You may have steri-strips (small skin tapes) in place directly over the incision.  These strips should be left on the skin for 7-10 days.  If your surgeon used skin glue on the incision, you may shower in 24 hours.  The glue will flake off over the  next 2-3 weeks.  Any sutures or staples will be removed at the office during your follow-up visit. 8. ACTIVITIES:  You may resume regular daily activities (gradually increasing) beginning the next day.  Wearing a good support bra or sports bra minimizes pain and swelling.  You may have sexual intercourse when it is comfortable. a. You may drive when you no longer are taking prescription pain medication, you can comfortably wear a seatbelt, and you can safely maneuver your car and apply brakes. b. RETURN TO WORK:  ______________________________________________________________________________________ 9. You should see your doctor in the office for a follow-up appointment approximately two weeks after your surgery.  Your doctors nurse will typically make your follow-up appointment when she calls you with your pathology report.  Expect your pathology report 2-3 business days after your surgery.  You may call to check if you do not hear from [place] after three days. 10. OTHER INSTRUCTIONS: _OK TO SHOWER STARTING TOMORROW 11. ICE PACK AND IBUPROFEN ALSO FOR PAIN 12. _____________________________________________________________________________________________ _____________________________________________________________________________________________________________________________________ _____________________________________________________________________________________________________________________________________ _____________________________________________________________________________________________________________________________________  WHEN TO CALL YOUR DOCTOR: 1. Fever over 101.0 2. Nausea and/or vomiting. 3. Extreme swelling or bruising. 4. Continued bleeding from incision. 5. Increased pain, redness, or drainage from the incision.  The clinic staff is available to answer your questions during regular business hours.  Please dont hesitate to call and ask to speak to one of the nurses  for clinical concerns.  If you have a medical emergency, go to the nearest emergency room or call 911.  A surgeon from [facility] is  always on call at the hospital. ° °For further questions, please visit [email or web address]  °

## 2016-10-08 NOTE — Anesthesia Postprocedure Evaluation (Signed)
Anesthesia Post Note  Patient: Mindy [name]  Procedure(s) Performed: Procedure(s) (LRB): RIGHT BREAST LUMPECTOMY WITH RADIOACTIVE SEED LOCALIZATION (Right)  Patient location during evaluation: PACU Anesthesia Type: General Level of consciousness: awake Pain management: pain level controlled Vital Signs Assessment: post-procedure vital signs reviewed and stable Respiratory status: spontaneous breathing Cardiovascular status: stable Postop Assessment: no signs of nausea or vomiting Anesthetic complications: no        Last Vitals:  Vitals:   10/08/16 1115 10/08/16 1126  BP:  (!) 150/89  Pulse: 61 70  Resp: 14 16  Temp: 36.2 C     Last Pain:  Vitals:   10/08/16 1115  TempSrc:   PainSc: 3    Pain Goal:                 Kelani Robart JR,JOHN Teyona Nichelson

## 2016-10-08 NOTE — Transfer of Care (Signed)
Immediate Anesthesia Transfer of Care Note  Patient: Mindy [name]  Procedure(s) Performed: Procedure(s): RIGHT BREAST LUMPECTOMY WITH RADIOACTIVE SEED LOCALIZATION (Right)  Patient Location: PACU  Anesthesia Type:General  Level of Consciousness: patient cooperative and responds to stimulation  Airway & Oxygen Therapy: Patient Spontanous Breathing and Patient connected to nasal cannula oxygen  Post-op Assessment: Report given to RN and Post -op Vital signs reviewed and stable  Post vital signs: Reviewed and stable  Last Vitals:  Vitals:   10/08/16 0821  BP: 125/75  Pulse: 64  Resp: 18  Temp: 36.7 C    Last Pain:  Vitals:   10/08/16 0829  TempSrc:   PainSc: 0-No pain         Complications: No apparent anesthesia complications

## 2016-10-08 NOTE — Anesthesia Preprocedure Evaluation (Signed)
Anesthesia Evaluation  Patient identified by MRN, date of birth, ID band Patient awake    Reviewed: Allergy & Precautions, NPO status , Patient's Chart, lab work & pertinent test results  Airway Mallampati: I       Dental no notable dental hx.    Pulmonary neg pulmonary ROS,    Pulmonary exam normal        Cardiovascular Normal cardiovascular exam     Neuro/Psych negative psych ROS   GI/Hepatic Neg liver ROS,   Endo/Other  negative endocrine ROS  Renal/GU negative Renal ROS     Musculoskeletal   Abdominal Normal abdominal exam  (+)   Peds  Hematology negative hematology ROS (+)   Anesthesia Other Findings   Reproductive/Obstetrics negative OB ROS                             Anesthesia Physical Anesthesia Plan  ASA: II  Anesthesia Plan: General   Post-op Pain Management:    Induction: Intravenous  Airway Management Planned: LMA  Additional Equipment:   Intra-op Plan:   Post-operative Plan:   Informed Consent: I have reviewed the patients History and Physical, chart, labs and discussed the procedure including the risks, benefits and alternatives for the proposed anesthesia with the patient or authorized representative who has indicated his/her understanding and acceptance.     Plan Discussed with: CRNA and Surgeon  Anesthesia Plan Comments:         Anesthesia Quick Evaluation

## 2016-10-09 ENCOUNTER — Encounter (HOSPITAL_COMMUNITY): Payer: Self-pay | Admitting: Surgery

## 2016-10-22 MED FILL — DEXILANT DR 60 MG CAPSULE: 60 | 90 days supply | Qty: 90 | Fill #1

## 2016-10-24 MED FILL — ELETRIPTAN HBR 40 MG TABLET: 40 | 30 days supply | Qty: 9 | Fill #5

## 2016-11-07 ENCOUNTER — Ambulatory Visit: Payer: Self-pay | Admitting: Internal Medicine

## 2016-11-08 MED FILL — PREMARIN 0.625 MG TABLET: 0.625 | 30 days supply | Qty: 30 | Fill #9

## 2016-11-08 MED FILL — DYMISTA NASAL SPRAY: 137-50 | 30 days supply | Qty: 23 | Fill #2

## 2016-11-11 MED FILL — BUTALBITAL/APAP/CAFFEINE TB: 50-325-40 | 18 days supply | Qty: 75 | Fill #0

## 2016-11-16 ENCOUNTER — Telehealth: Payer: 59 | Admitting: Nurse Practitioner

## 2016-11-16 DIAGNOSIS — N3000 Acute cystitis without hematuria: Secondary | ICD-10-CM | POA: Diagnosis not present

## 2016-11-16 MED ORDER — CIPROFLOXACIN HCL 500 MG PO TABS
500.0000 mg | ORAL_TABLET | Freq: Two times a day (BID) | ORAL | 0 refills | Status: DC
Start: 2016-11-16 — End: 2016-12-22

## 2016-11-16 NOTE — Progress Notes (Signed)

## 2016-11-25 MED FILL — AMOXICILLIN 250 MG CAPSULE: 250 | 10 days supply | Qty: 40 | Fill #0

## 2016-11-29 MED FILL — HYDROCODON-APAP 5-325: 5-325 | 2 days supply | Qty: 20 | Fill #0

## 2016-11-29 MED FILL — AMOX-CLAV 875-125 MG TABLET: 875-125 | 7 days supply | Qty: 21 | Fill #0

## 2016-12-09 MED FILL — PREMARIN 0.625 MG TABLET: 0.625 | 30 days supply | Qty: 30 | Fill #10

## 2016-12-09 MED FILL — LINZESS 72 MCG CAPSULE: 72 | 30 days supply | Qty: 30 | Fill #1

## 2016-12-09 MED FILL — ELETRIPTAN HBR 40 MG TABLET: 40 | 19 days supply | Qty: 6 | Fill #6

## 2016-12-13 MED FILL — BUTALBITAL/APAP/CAFFEINE TB: 50-325-40 | 18 days supply | Qty: 75 | Fill #1

## 2016-12-17 DIAGNOSIS — J320 Chronic maxillary sinusitis: Secondary | ICD-10-CM | POA: Diagnosis not present

## 2016-12-17 DIAGNOSIS — Z7289 Other problems related to lifestyle: Secondary | ICD-10-CM | POA: Diagnosis not present

## 2016-12-17 DIAGNOSIS — J310 Chronic rhinitis: Secondary | ICD-10-CM | POA: Diagnosis not present

## 2016-12-17 MED FILL — levoFLOXacin 500 MG TABS: 500 | 10 days supply | Qty: 10 | Fill #0

## 2016-12-19 ENCOUNTER — Encounter: Payer: Self-pay | Admitting: Internal Medicine

## 2016-12-22 MED ORDER — FLUCONAZOLE 150 MG PO TABS: 150.0000 mg | ORAL_TABLET | Freq: Once | ORAL | 0 refills | Status: DC

## 2016-12-23 ENCOUNTER — Other Ambulatory Visit (HOSPITAL_COMMUNITY): Payer: Self-pay | Admitting: Otolaryngology

## 2016-12-23 DIAGNOSIS — J0141 Acute recurrent pansinusitis: Secondary | ICD-10-CM

## 2016-12-23 MED ORDER — FLUCONAZOLE 150 MG PO TABS: 150.0000 mg | ORAL_TABLET | Freq: Once | ORAL | 0 refills | Status: AC

## 2016-12-23 NOTE — Addendum Note (Signed)
Addended by: Binnie Rail on: 12/23/2016 11:26 AM   Modules accepted: Orders

## 2016-12-27 ENCOUNTER — Ambulatory Visit (HOSPITAL_COMMUNITY)
Admission: RE | Admit: 2016-12-27 | Discharge: 2016-12-27 | Disposition: A | Discharge status: Home / Self Care | Payer: 59 | Source: Ambulatory Visit | Attending: Otolaryngology | Admitting: Otolaryngology

## 2016-12-27 DIAGNOSIS — J0141 Acute recurrent pansinusitis: Secondary | ICD-10-CM

## 2016-12-27 DIAGNOSIS — J323 Chronic sphenoidal sinusitis: Secondary | ICD-10-CM | POA: Insufficient documentation

## 2017-01-03 DIAGNOSIS — J302 Other seasonal allergic rhinitis: Secondary | ICD-10-CM | POA: Diagnosis not present

## 2017-01-03 DIAGNOSIS — R6884 Jaw pain: Secondary | ICD-10-CM | POA: Diagnosis not present

## 2017-01-05 ENCOUNTER — Encounter: Payer: Self-pay | Admitting: Internal Medicine

## 2017-01-06 MED ORDER — ELETRIPTAN HYDROBROMIDE 40 MG PO TABS: 40.0000 mg | ORAL_TABLET | ORAL | 2 refills | Status: DC | PRN

## 2017-01-06 MED FILL — ELETRIPTAN HBR 40 MG TABLET: 40 | 30 days supply | Qty: 12 | Fill #0

## 2017-01-08 MED FILL — PREMARIN 0.625 MG TABLET: 0.625 | 30 days supply | Qty: 30 | Fill #11

## 2017-01-10 MED FILL — BUTALBITAL/APAP/CAFFEINE TB: 50-325-40 | 18 days supply | Qty: 75 | Fill #2

## 2017-01-17 MED FILL — DEXILANT DR 60 MG CAPSULE: 60 | 90 days supply | Qty: 90 | Fill #2

## 2017-01-22 DIAGNOSIS — D225 Melanocytic nevi of trunk: Secondary | ICD-10-CM | POA: Diagnosis not present

## 2017-01-22 DIAGNOSIS — L814 Other melanin hyperpigmentation: Secondary | ICD-10-CM | POA: Diagnosis not present

## 2017-01-22 DIAGNOSIS — D1801 Hemangioma of skin and subcutaneous tissue: Secondary | ICD-10-CM | POA: Diagnosis not present

## 2017-01-22 DIAGNOSIS — Z808 Family history of malignant neoplasm of other organs or systems: Secondary | ICD-10-CM | POA: Diagnosis not present

## 2017-01-26 ENCOUNTER — Other Ambulatory Visit: Payer: Self-pay | Admitting: Internal Medicine

## 2017-01-27 ENCOUNTER — Other Ambulatory Visit: Payer: Self-pay | Admitting: Internal Medicine

## 2017-01-27 MED ORDER — HYDROCORTISONE 2.5 % RE CREA: 1.0000 "application " | TOPICAL_CREAM | Freq: Two times a day (BID) | RECTAL | 0 refills | Status: DC

## 2017-01-27 MED FILL — DYMISTA NASAL SPRAY: 137-50 | 30 days supply | Qty: 23 | Fill #3

## 2017-01-27 MED FILL — PROCTO-MED HC 2.5% CREAM: 2.5 | 15 days supply | Qty: 30 | Fill #0

## 2017-02-03 MED FILL — LINZESS 72 MCG CAPSULE: 72 | 30 days supply | Qty: 30 | Fill #2

## 2017-02-06 MED FILL — ELETRIPTAN HBR 40 MG TABLET: 40 | 30 days supply | Qty: 12 | Fill #1

## 2017-02-12 ENCOUNTER — Telehealth: Payer: 59 | Admitting: Family

## 2017-02-12 DIAGNOSIS — M549 Dorsalgia, unspecified: Secondary | ICD-10-CM

## 2017-02-12 DIAGNOSIS — R109 Unspecified abdominal pain: Secondary | ICD-10-CM

## 2017-02-12 NOTE — Progress Notes (Signed)
Based on what you shared with me it looks like you have a serious condition that should be evaluated in a face to face office visit.  NOTE: Even if you have entered your credit card information for this eVisit, you will not be charged.   If you are having a true medical emergency please call 911.  If you need an urgent face to face visit, [facility] has four urgent care centers for your convenience.  If you need care fast and have a high deductible or no insurance consider:   [email or web address]  [phone]  [address], [address] [place], [place] [address] 8 am to 8 pm Monday-Friday 10 am to 4 pm Saturday-Sunday   The following sites will take your  insurance:    . [facility]  [phone] Get Driving Directions Find a Provider at this Location  [address] [place], [place] [address] . 10 am to 8 pm Monday-Friday . 12 pm to 8 pm Saturday-Sunday   . [facility]  [phone] Get Driving Directions Find a Provider at this Location  [address], [address] [place], [place] [address] . 8 am to 8 pm Monday-Friday . 9 am to 6 pm Saturday . 11 am to 6 pm Sunday   .  [facility]gent [facility] at [facility] [place]  [phone] Get Driving Directions  [address] [place], [place] [address] . 8 am to 8 pm Monday-Friday . 8 am to 4 pm Saturday-Sunday   Your e-visit answers were reviewed by a board certified advanced clinical practitioner to complete your personal care plan.  Thank you for using e-Visits.  

## 2017-02-13 ENCOUNTER — Ambulatory Visit (INDEPENDENT_AMBULATORY_CARE_PROVIDER_SITE_OTHER): Payer: 59 | Admitting: Internal Medicine

## 2017-02-13 ENCOUNTER — Encounter: Payer: Self-pay | Admitting: Internal Medicine

## 2017-02-13 ENCOUNTER — Other Ambulatory Visit (INDEPENDENT_AMBULATORY_CARE_PROVIDER_SITE_OTHER): Payer: 59

## 2017-02-13 DIAGNOSIS — M545 Low back pain, unspecified: Secondary | ICD-10-CM

## 2017-02-13 DIAGNOSIS — M5431 Sciatica, right side: Secondary | ICD-10-CM | POA: Insufficient documentation

## 2017-02-13 LAB — URINALYSIS, ROUTINE W REFLEX MICROSCOPIC
Bilirubin Urine: NEGATIVE
Hgb urine dipstick: NEGATIVE
Ketones, ur: NEGATIVE
Leukocytes, UA: NEGATIVE
Nitrite: NEGATIVE
RBC / HPF: NONE SEEN (ref 0–?)
Specific Gravity, Urine: 1.01 (ref 1.000–1.030)
Total Protein, Urine: NEGATIVE
Urine Glucose: NEGATIVE
Urobilinogen, UA: 0.2 (ref 0.0–1.0)
WBC, UA: NONE SEEN (ref 0–?)
pH: 6 (ref 5.0–8.0)

## 2017-02-13 MED ORDER — TRAMADOL HCL 50 MG PO TABS: 50.0000 mg | ORAL_TABLET | Freq: Three times a day (TID) | ORAL | 0 refills | Status: DC | PRN

## 2017-02-13 MED FILL — traMADol HCL 50 MG TABS: 50 | 13 days supply | Qty: 40 | Fill #0

## 2017-02-13 NOTE — Progress Notes (Signed)
Pre visit review using our clinic review tool, if applicable. No additional management support is needed unless otherwise documented below in the visit note. 

## 2017-02-13 NOTE — Assessment & Plan Note (Addendum)
1 wk recent onset, no prior hx of lumbar dz but has no specific urinary symptoms; though she is really hopeful and will check her urine tests but I suspect this is MSK, for tramadol prn as nsaids tylenol no help; consider muscle relaxer if needed as well; pt would need plain films and/or MRI LS Spine for worsening or persistent symptoms

## 2017-02-13 NOTE — Patient Instructions (Signed)
Please take all new medication as prescribed - the pain medication if needed  Please continue all other medications as before, and refills have been done if requested.  Please have the pharmacy call with any other refills you may need.  Please keep your appointments with your specialists as you may have planned  Please go to the LAB in the Basement (turn left off the elevator) for the tests to be done today  You will be contacted by phone if any changes need to be made immediately.  Otherwise, you will receive a letter about your results with an explanation, but please check with MyChart first.  Please remember to sign up for MyChart if you have not done so, as this will be important to you in the future with finding out test results, communicating by private email, and scheduling acute appointments online when needed.

## 2017-02-13 NOTE — Progress Notes (Signed)
Subjective:    Patient ID: Mindy [name], female    DOB: 06/30/1961, 56 y.o.   MRN: [id number]  HPI  Here with 1 wk onset lower back pain , dull and sharp at times, mild to mod but constant, persistent, tried otc AZO , tylenol or alleve but no help;  Has also some HA, and intermittent nausea mild but Denies urinary symptoms such as dysuria, frequency, urgency, flank pain, hematuria or vomiting, fever, chills.  Pt denies chest pain, increased sob or doe, wheezing, orthopnea, PND, increased LE swelling, palpitations, dizziness or syncope.  Pt denies new neurological symptoms such as new headache, or facial or extremity weakness or numbness   Pt denies polydipsia, polyuria  Has hx of right sciatica but no other  bowel or bladder change, fever, wt loss,  worsening LE pain/numbness/weakness, gait change or falls. Past Medical History:  Diagnosis Date  . Allergic rhinitis   . Complication of anesthesia   . Elevated transaminase level 12.2014   ?etiology - present 09/2013 hosp for epigastric pain  . Family history of adverse reaction to anesthesia    father - PONV  . GERD (gastroesophageal reflux disease)   . History of bronchitis 10/2014  . Migraine   . PONV (postoperative nausea and vomiting)   . Seasonal allergies    Past Surgical History:  Procedure Laterality Date  . BREAST BIOPSY Right   . BREAST LUMPECTOMY WITH RADIOACTIVE SEED LOCALIZATION Right 10/08/2016   Procedure: RIGHT BREAST LUMPECTOMY WITH RADIOACTIVE SEED LOCALIZATION;  Surgeon: Coralie Keens, MD;  Location: [facility];  Service: General;  Laterality: Right;  . CHOLECYSTECTOMY N/A 09/21/2015   Procedure: LAPAROSCOPIC CHOLECYSTECTOMY;  Surgeon: Coralie Keens, MD;  Location: [facility];  Service: General;  Laterality: N/A;  . COLONOSCOPY  12/2013  . NECK SURGERY  2009  . PARTIAL HYSTERECTOMY  2007  . UPPER GI ENDOSCOPY  2016    reports that she has never smoked. She has never used smokeless tobacco. She reports that she drinks  alcohol. She reports that she does not use drugs. family history includes Atrial fibrillation in her father; Coronary artery disease (age of onset: 87) in her father; Dementia in her mother; Diabetes type II in her father; Hypertension in her brother, father, and mother. Allergies  Allergen Reactions  . Oxaprozin Hives and Itching    "daypro"   Current Outpatient Prescriptions on File Prior to Visit  Medication Sig Dispense Refill  . butalbital-acetaminophen-caffeine (FIORICET, ESGIC) 50-325-40 MG tablet Take 1 tablet by mouth every 6 (six) hours as needed for headache. 75 tablet 5  . DEXILANT 60 MG capsule Take 60 mg by mouth daily.   12  . DYMISTA 137-50 MCG/ACT SUSP Place 2 sprays into both nostrils at bedtime. 23 g 11  . eletriptan (RELPAX) 40 MG tablet Take 1 tablet (40 mg total) by mouth as needed for migraine or headache. May repeat in 2 hours if headache persists or recurs. 10 tablet 2  . estrogens, conjugated, (PREMARIN) 0.625 MG tablet Take 0.625 mg by mouth daily.     Marland Kitchen HYDROcodone-acetaminophen (NORCO/VICODIN) 5-325 MG tablet Take 1-2 tablets by mouth every 4 (four) hours as needed for moderate pain or severe pain. 40 tablet 0  . hydrocortisone (ANUSOL-HC) 2.5 % rectal cream Place 1 application rectally 2 (two) times daily. 30 g 0  . linaclotide (LINZESS) 72 MCG capsule Take 1 capsule (72 mcg total) by mouth daily before breakfast. (Patient taking differently: Take 72 mcg by mouth 3 (three) times  a week. ) 30 capsule 5  . Naproxen Sodium (ALEVE) 220 MG CAPS Take 220-440 mg by mouth 2 (two) times daily as needed (pain/headaches).     Marland Kitchen oxymetazoline (AFRIN) 0.05 % nasal spray Place 2 sprays into the nose at bedtime as needed for congestion.    . pseudoephedrine (SUDAFED) 30 MG tablet Take 30 mg by mouth 2 (two) times daily as needed for congestion.    . Pseudoephedrine-APAP-DM (DAYQUIL MULTI-SYMPTOM COLD/FLU PO) Take by mouth.    . sodium chloride (OCEAN) 0.65 % nasal spray Place 1  spray into the nose 4 (four) times daily as needed for congestion (and dryness).     No current facility-administered medications on file prior to visit.    Review of Systems  Constitutional: Negative for other unusual diaphoresis or sweats HENT: Negative for ear discharge or swelling Eyes: Negative for other worsening visual disturbances Respiratory: Negative for stridor or other swelling  Gastrointestinal: Negative for worsening distension or other blood Genitourinary: Negative for retention or other urinary change Musculoskeletal: Negative for other MSK pain or swelling Skin: Negative for color change or other new lesions Neurological: Negative for worsening tremors and other numbness  Psychiatric/Behavioral: Negative for worsening agitation or other fatigue All other system neg per pt    Objective:   Physical Exam BP 112/76   Pulse 66   Ht 5\' 4"  (1.626 m)   Wt 131 lb (59.4 kg)   SpO2 99%   BMI 22.49 kg/m  VS noted,  Constitutional: Pt appears in NAD HENT: Head: NCAT.  Right Ear: External ear normal.  Left Ear: External ear normal.  Eyes: . Pupils are equal, round, and reactive to light. Conjunctivae and EOM are normal Nose: without d/c or deformity Neck: Neck supple. Gross normal ROM Cardiovascular: Normal rate and regular rhythm.   Pulmonary/Chest: Effort normal and breath sounds without rales or wheezing.  Abd:  Soft, NT, ND, + BS, no organomegaly Spine: nontender bilat, does have some tenderness to left lumbar paravertebral Neurological: Pt is alert. At baseline orientation, motor grossly intact Skin: Skin is warm. No rashes, other new lesions, no LE edema Psychiatric: Pt behavior is normal without agitation  No other exam findings    Assessment & Plan:

## 2017-02-15 LAB — URINE CULTURE: Organism ID, Bacteria: NO GROWTH

## 2017-02-17 ENCOUNTER — Encounter: Payer: Self-pay | Admitting: Internal Medicine

## 2017-02-17 MED ORDER — CYCLOBENZAPRINE HCL 5 MG PO TABS: 5.0000 mg | ORAL_TABLET | Freq: Three times a day (TID) | ORAL | 1 refills | Status: DC | PRN

## 2017-02-17 MED FILL — CYCLOBENZAPRINE 5 MG TABLET: 5 | 10 days supply | Qty: 30 | Fill #0

## 2017-02-18 DIAGNOSIS — Z6822 Body mass index (BMI) 22.0-22.9, adult: Secondary | ICD-10-CM | POA: Diagnosis not present

## 2017-02-18 DIAGNOSIS — Z01419 Encounter for gynecological examination (general) (routine) without abnormal findings: Secondary | ICD-10-CM | POA: Diagnosis not present

## 2017-02-18 MED FILL — PREMARIN 0.625 MG TABLET: 0.625 | 90 days supply | Qty: 90 | Fill #0

## 2017-03-02 NOTE — Assessment & Plan Note (Signed)
Discussed relationship with headache nasal symptoms. Plan-refill Fioricet with appropriate discussion

## 2017-03-02 NOTE — Assessment & Plan Note (Signed)
There is probably some overlap with tension headache. We discussed appropriate use of Dymista nasal spray

## 2017-03-03 MED FILL — BUTALBITAL/APAP/CAFFEINE TB: 50-325-40 | 18 days supply | Qty: 75 | Fill #3

## 2017-03-13 NOTE — Progress Notes (Signed)
Subjective:    Patient ID: Mindy [name], female    DOB: 1961/01/27, 56 y.o.   MRN: [id number]  HPI She is here for an acute visit.   Leg pain: she woke up two days ago with her right leg hurting.  It was hurting where she had a varicose vein and it was tender to touch.  It hurt all day yesterday.  She has been taking aleve and the pain is better.  The area is not red or swollen.  She denies ankle swelling.  She has some b/l feet swelling.   She denies injury to the leg.  She denies any unusual activities. She did drive to the [facility] last weekend, but was walking and does not feel that she was an active for any prolonged period of time.  She was canceled the appointment, but he was not able to cancel it within 24 hours.  Medications and allergies reviewed with patient and updated if appropriate.  Patient Active Problem List   Diagnosis Date Noted  . Low back pain 02/13/2017  . Right sided sciatica 02/13/2017  . RLQ abdominal pain 08/20/2016  . Anal itching 08/20/2016  . GERD (gastroesophageal reflux disease) 06/05/2016  . Migraine 12/27/2015  . Headache 12/27/2015  . Lateral epicondylitis of left elbow 06/27/2015  . Cervical radiculitis 06/27/2015  . Epigastric pain 09/20/2013  . 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 butalbital-acetaminophen-caffeine (FIORICET, ESGIC) 50-325-40 MG tablet Take 1 tablet by mouth every 6 (six) hours as needed for headache. 75 tablet 5  . cyclobenzaprine (FLEXERIL) 5 MG tablet Take 1 tablet (5 mg total) by mouth 3 (three) times daily as needed for muscle spasms. 30 tablet 1  . DEXILANT 60 MG capsule Take 60 mg by mouth daily.   12  . DYMISTA 137-50 MCG/ACT SUSP Place 2 sprays into both nostrils at bedtime. 23 g 11  . eletriptan (RELPAX) 40 MG tablet Take 1 tablet (40 mg total) by mouth as needed for migraine or headache. May repeat in 2 hours if  headache persists or recurs. 10 tablet 2  . estrogens, conjugated, (PREMARIN) 0.625 MG tablet Take 0.625 mg by mouth daily.     Marland Kitchen HYDROcodone-acetaminophen (NORCO/VICODIN) 5-325 MG tablet Take 1-2 tablets by mouth every 4 (four) hours as needed for moderate pain or severe pain. 40 tablet 0  . hydrocortisone (ANUSOL-HC) 2.5 % rectal cream Place 1 application rectally 2 (two) times daily. 30 g 0  . linaclotide (LINZESS) 72 MCG capsule Take 1 capsule (72 mcg total) by mouth daily before breakfast. (Patient taking differently: Take 72 mcg by mouth 3 (three) times a week. ) 30 capsule 5  . Naproxen Sodium (ALEVE) 220 MG CAPS Take 220-440 mg by mouth 2 (two) times daily as needed (pain/headaches).     Marland Kitchen oxymetazoline (AFRIN) 0.05 % nasal spray Place 2 sprays into the nose at bedtime as needed for congestion.    . pseudoephedrine (SUDAFED) 30 MG tablet Take 30 mg by mouth 2 (two) times daily as needed for congestion.    . Pseudoephedrine-APAP-DM (DAYQUIL MULTI-SYMPTOM COLD/FLU PO) Take by mouth.    . sodium chloride (OCEAN) 0.65 % nasal spray Place 1 spray into the nose 4 (four) times daily as needed for congestion (and dryness).    . traMADol (ULTRAM) 50 MG tablet Take 1 tablet (50 mg total) by mouth every 8 (eight) hours as needed. [employer]  tablet 0   No current facility-administered medications on file prior to visit.     Past Medical History:  Diagnosis Date  . Allergic rhinitis   . Complication of anesthesia   . Elevated transaminase level 12.2014   ?etiology - present 09/2013 hosp for epigastric pain  . Family history of adverse reaction to anesthesia    father - PONV  . GERD (gastroesophageal reflux disease)   . History of bronchitis 10/2014  . Migraine   . PONV (postoperative nausea and vomiting)   . Seasonal allergies     Past Surgical History:  Procedure Laterality Date  . BREAST BIOPSY Right   . BREAST LUMPECTOMY WITH RADIOACTIVE SEED LOCALIZATION Right 10/08/2016   Procedure: RIGHT  BREAST LUMPECTOMY WITH RADIOACTIVE SEED LOCALIZATION;  Surgeon: Coralie Keens, MD;  Location: [facility];  Service: General;  Laterality: Right;  . CHOLECYSTECTOMY N/A 09/21/2015   Procedure: LAPAROSCOPIC CHOLECYSTECTOMY;  Surgeon: Coralie Keens, MD;  Location: [facility];  Service: General;  Laterality: N/A;  . COLONOSCOPY  12/2013  . NECK SURGERY  2009  . PARTIAL HYSTERECTOMY  2007  . UPPER GI ENDOSCOPY  2016    Social History   Social History  . Marital status: Divorced    Spouse name: N/A  . Number of children: N/A  . Years of education: N/A   Occupational History  . RN    Social History Main Topics  . Smoking status: Never Smoker  . Smokeless tobacco: Never Used  . Alcohol use Yes     Comment: occ  . Drug use: No  . Sexual activity: Not on file   Other Topics Concern  . Not on file   Social History Narrative   Exercise: on average 3/week - running, weights sometimes    Family History  Problem Relation Age of Onset  . Atrial fibrillation Father   . Diabetes type II Father   . Hypertension Father   . Coronary artery disease Father 35       stent x 2, AoVR  . Hypertension Mother   . Dementia Mother   . Hypertension Brother     Review of Systems  Constitutional: Negative for chills and fever.  Cardiovascular: Positive for leg swelling (feet b/l).  Skin: Negative for color change and rash.  Neurological: Negative for numbness.       Objective:   Vitals:   03/14/17 1613  BP: 118/76  Pulse: 74  Resp: 16  Temp: 98.8 F (37.1 C)   Filed Weights   03/14/17 1613  Weight: 133 lb (60.3 kg)   Body mass index is 22.83 kg/m.  Wt Readings from Last 3 Encounters:  03/14/17 133 lb (60.3 kg)  02/13/17 131 lb (59.4 kg)  10/08/16 127 lb (57.6 kg)     Physical Exam  Constitutional: She appears well-developed and well-nourished. No distress.  Cardiovascular: Intact distal pulses.   Musculoskeletal: She exhibits edema (Mild edema bilateral feet, no ankle or  lower leg swelling).  Tenderness along superficial varicose vein right proximal lower leg, no palpable superficial clot, Not tender to palpation, but tender with flexion of foot  Skin: Skin is warm and dry. No rash noted. She is not diaphoretic. No erythema.          Assessment & Plan:   See Problem List for Assessment and Plan of chronic medical problems.

## 2017-03-14 ENCOUNTER — Encounter: Payer: Self-pay | Admitting: Internal Medicine

## 2017-03-14 ENCOUNTER — Ambulatory Visit (INDEPENDENT_AMBULATORY_CARE_PROVIDER_SITE_OTHER): Payer: 59 | Admitting: Internal Medicine

## 2017-03-14 VITALS — BP 118/76 | HR 74 | Temp 98.8°F | Resp 16 | Wt 133.0 lb

## 2017-03-14 DIAGNOSIS — I8001 Phlebitis and thrombophlebitis of superficial vessels of right lower extremity: Secondary | ICD-10-CM | POA: Insufficient documentation

## 2017-03-14 NOTE — Patient Instructions (Signed)
Keep taking the aleve and apply heat to the area as needed.   If your symptoms worsen or do not improve let me know so we can do an ultrasound.   If you have chest, shortness of breath or leg swelling over the weekend go to the ED.

## 2017-03-14 NOTE — Assessment & Plan Note (Signed)
Pain improved with Aleve Probable superficial thrombophlebitis, but discussed with her possible risk of DVT Discussed that the only way to rule out a DVT at this point was for her to go to the emergency room since it was a holiday weekend Discussed treatment of superficial thrombophlebitis and can continue Aleve and apply heat She would like to continue conservative treatment for the superficial thrombophlebitis-continue Aleve and apply heat If symptoms not improved by next week she will call so we can order an ultrasound If she experiences any concerning symptoms such as chest pain, shortness of breath or swelling she will go to the emergency room this weekend

## 2017-04-03 ENCOUNTER — Ambulatory Visit (INDEPENDENT_AMBULATORY_CARE_PROVIDER_SITE_OTHER): Payer: 59 | Admitting: Internal Medicine

## 2017-04-03 ENCOUNTER — Encounter: Payer: Self-pay | Admitting: Internal Medicine

## 2017-04-03 DIAGNOSIS — F5101 Primary insomnia: Secondary | ICD-10-CM

## 2017-04-03 DIAGNOSIS — J300 Vasomotor rhinitis: Secondary | ICD-10-CM

## 2017-04-03 DIAGNOSIS — R51 Headache: Secondary | ICD-10-CM | POA: Diagnosis not present

## 2017-04-03 DIAGNOSIS — G4700 Insomnia, unspecified: Secondary | ICD-10-CM | POA: Insufficient documentation

## 2017-04-03 DIAGNOSIS — R519 Headache, unspecified: Secondary | ICD-10-CM

## 2017-04-03 MED ORDER — BUTALBITAL-APAP-CAFFEINE 50-325-40 MG PO TABS: 1.0000 | ORAL_TABLET | Freq: Four times a day (QID) | ORAL | 5 refills | Status: DC | PRN

## 2017-04-03 MED ORDER — ZALEPLON 5 MG PO CAPS: ORAL_CAPSULE | ORAL | 5 refills | Status: DC

## 2017-04-03 MED ORDER — DYMISTA 137-50 MCG/ACT NA SUSP: 2.0000 | Freq: Every day | NASAL | 11 refills | Status: DC

## 2017-04-03 MED FILL — BUTALBITAL/APAP/CAFFEINE TB: 50-325-40 | 19 days supply | Qty: 75 | Fill #0

## 2017-04-03 MED FILL — ZALEPLON 5 MG CAPSULE: 5 | 30 days supply | Qty: 30 | Fill #0

## 2017-04-03 MED FILL — DYMISTA NASAL SPRAY: 137-50 | 30 days supply | Qty: 23 | Fill #0

## 2017-04-03 NOTE — Assessment & Plan Note (Signed)
Fioricet has worked pretty well, used appropriately and intermittently over the years. If headache gets worse with migraine pattern she uses Relpax from PCP

## 2017-04-03 NOTE — Progress Notes (Signed)
Patient ID: Mindy [name], female    DOB: 05/17/1961, 56 y.o.   MRN: [id number]  HPI female never smoker followed for chronic rhinitis/sinusitis, complicated by headache Allergy profile 12/27/13- Neg, Total IgE 4.5 Had failed allergy vaccine Eos not elevated 09/12/14 Sinus xray 12/12/2015-no evidence of acute sinusitis, clear -------------------------------------------------------------------  03/25/2016-56 year old female never smoker followed for chronic rhinitis/sinusitis, complicated by headache FOLLOWS FOR: Pt states she has been slightly stuffy and sneezing for about past week. She uses Sudafed, occasional Afrin, saline nasal spray. Not much use for antihistamines. Stuffiness is a problem but drainage is not. No cough or wheeze. We discussed study describing anti-inflammatory effect of verapamil on rhinitis. Her blood pressure tends to run a little low. She is satisfied with status quo.  10/03/2016-56 year old female never smoker followed for chronic rhinitis/sinusitis, complicated by headache, breast cancer FOLLOWS FOR: Sinus pressure, dry hacky cough started over the weekend-treating the symptoms. Otherwise allergies have been doing good. Sinus xray 12/12/2015-no evidence of acute sinusitis, clear  Using nasal saline spray and occasional Afrin. Still uses Dymista nasal spray at bedtime. We discussed lab findings from past evaluations not showing active allergy component or obvious active acute sinus disease.  04/03/17- 56 year old female never smoker followed for chronic rhinitis/sinusitis, complicated by headache, breast cancer FOLLOW UP FOR 6 months patients states that she is feeling good. patient states that it is only the seasonal allergie going on patient is not sleeping  Right sphenoid sinusitis and a cracked tooth overlapped to cause pain in March but that has resolved with appropriate management and she now feels well. Mild variable nasal congestion for which she asks refill  Dymista. Insomnia more of a problem recently, aggravated by menopause symptoms with hot flashes, and personal issues with stress. Difficulty initiating and maintaining sleep. She wants to avoid morning sleepiness. We discussed sleep habits and management. CTmax/fac 12/27/16- by Dr Wilburn Cornelia 1. Right sphenoid sinusitis. 2. No other significant sinus disease.  Review of Systems- see HPI Constitutional:   No-   weight loss, night sweats, fevers, chills, fatigue, lassitude. HEENT:   +  headaches,  No-difficulty swallowing, tooth/dental problems, sore throat,       sneezing, itching, ear ache, +nasal congestion, post nasal drip,  CV:  chest pain, orthopnea, PND, swelling in lower extremities, anasarca, dizziness, palpitations Resp: +shortness of breath with exertion or at rest.              No-   productive cough, + non-productive cough,  No-  coughing up of blood.              No-   change in color of mucus.  No- wheezing.   Skin: No-   rash or lesions. GI:  No-   heartburn, indigestion, abdominal pain, nausea, vomiting,  GU:  MS:  No-   joint pain or swelling.   Neuro- nothing unusual Psych:  No- change in mood or affect. No depression or anxiety.  No memory loss.  Objective:   Physical Exam General- Alert, Oriented, Affect-appropriate, Distress- none, + Looks well today Skin- rash-none, lesions- none, excoriation- none Lymphadenopathy- none Head- atraumatic            Eyes- Gross vision intact, PERRLA, conjunctivae clear secretions            Ears- Hearing, canals            Nose- , +turbinate edema, +Septal dev, no-mucus,  No-polyps, erosion, perforation.  Throat- Mallampati III-IV , mucosa clear , drainage- none, tonsils- atrophic Neck- flexible , trachea midline, no stridor , thyroid nl, carotid no bruit Chest - symmetrical excursion , unlabored           Heart/CV- RRR , no murmur , no gallop  , no rub, nl s1 s2                           - JVD- none , edema- none, stasis  changes- none, varices- none           Lung-  wheeze -none, cough-none , dullness-none, rub- none           Chest wall-  Abd-  Br/ Gen/ Rectal- Not done, not indicated Extrem- cyanosis- none, clubbing, none, atrophy- none, strength- nl Neuro- grossly intact to observation

## 2017-04-03 NOTE — Patient Instructions (Signed)
Script printed for [employer] 5 mg     You can take 1 or 2 at bedtime, 1 at bedtime and one during the night, or 2 during the night, as long as you have 3-4 hours of intended sleep time after you take it.  Scripts printed for Fioricet and Dymista  Please call if we can help

## 2017-04-03 NOTE — Assessment & Plan Note (Signed)
Variable, perennial nasal congestion. Dymista does help. Plan-refill Dymista with discussion

## 2017-04-03 NOTE — Assessment & Plan Note (Signed)
Multifactorial insomnia aggravated by hot flashes, job stress, somatic discomforts. Appropriate educational discussion. Plan-Sonata 5 mg for use intermittently as needed.

## 2017-04-09 MED FILL — LINZESS 72 MCG CAPSULE: 72 | 30 days supply | Qty: 30 | Fill #3

## 2017-04-14 MED FILL — ELETRIPTAN HBR 40 MG TABLET: 40 | 15 days supply | Qty: 6 | Fill #2

## 2017-04-15 ENCOUNTER — Encounter: Payer: Self-pay | Admitting: Internal Medicine

## 2017-04-16 MED FILL — DEXILANT DR 60 MG CAPSULE: 60 | 90 days supply | Qty: 90 | Fill #3

## 2017-05-14 ENCOUNTER — Other Ambulatory Visit: Payer: Self-pay | Admitting: Internal Medicine

## 2017-05-14 MED FILL — ELETRIPTAN HBR 40 MG TABLET: 40 | 30 days supply | Qty: 10 | Fill #0

## 2017-05-14 MED FILL — BUTALBITAL/APAP/CAFFEINE TB: 50-325-40 | 19 days supply | Qty: 75 | Fill #1

## 2017-05-14 MED FILL — LINZESS 72 MCG CAPSULE: 72 | 30 days supply | Qty: 30 | Fill #4

## 2017-05-23 MED FILL — PREMARIN 0.625 MG TABLET: 0.625 | 90 days supply | Qty: 90 | Fill #1

## 2017-05-23 MED FILL — ZALEPLON 5 MG CAPSULE: 5 | 30 days supply | Qty: 30 | Fill #1

## 2017-06-05 NOTE — Progress Notes (Signed)
Subjective:    Patient ID: Mindy [name], female    DOB: 01-20-1961, 56 y.o.   MRN: [id number]  HPI She is here for a physical exam.   She thinks she needs a higher dose of linzess at times, but not daly.  She wonders about increasing the dose, but wants to avoid diarrhea.   She had increased anxiety recently related to her dad and his health problems.  She was given sonata by Dr Annamaria Boots.  It only lets her sleep a couple of hours.   Medications and allergies reviewed with patient and updated if appropriate.  Patient Active Problem List   Diagnosis Date Noted  . Insomnia 04/03/2017  . 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 Low back pain 02/13/2017  . Right sided sciatica 02/13/2017  . RLQ abdominal pain 08/20/2016  . Anal itching 08/20/2016  . GERD (gastroesophageal reflux disease) 06/05/2016  . Migraine 12/27/2015  . Headache 12/27/2015  . Lateral epicondylitis of left elbow 06/27/2015  . Cervical radiculitis 06/27/2015  . Epigastric pain 09/20/2013  . 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 butalbital-acetaminophen-caffeine (FIORICET, ESGIC) 50-325-40 MG tablet Take 1 tablet by mouth every 6 (six) hours as needed for headache. 75 tablet 5  . DEXILANT 60 MG capsule Take 60 mg by mouth daily.   12  . DYMISTA 137-50 MCG/ACT SUSP Place 2 sprays into both nostrils at bedtime. 23 g 11  . eletriptan (RELPAX) 40 MG tablet TAKE 1 TABLET BY MOUTH AS NEEDED FOR MIGRAINE OR HEADACHE. MAY REPEAT IN 2 HOURS IF HEADACHE PERSISTS OR RECURS 10 tablet 0  . estrogens, conjugated, (PREMARIN) 0.625 MG tablet Take 0.625 mg by mouth daily.     . Naproxen Sodium (ALEVE) 220 MG CAPS Take 220-440 mg by mouth 2 (two) times daily as needed (pain/headaches).     Marland Kitchen oxymetazoline (AFRIN) 0.05 % nasal spray Place 2 sprays into the nose at bedtime as needed for congestion.      . pseudoephedrine (SUDAFED) 30 MG tablet Take 30 mg by mouth 2 (two) times daily as needed for congestion.    . sodium chloride (OCEAN) 0.65 % nasal spray Place 1 spray into the nose 4 (four) times daily as needed for congestion (and dryness).    . zaleplon (SONATA) 5 MG capsule 1 or 2 for sleep as needed 30 capsule 5   No current facility-administered medications on file prior to visit.     Past Medical History:  Diagnosis Date  . Allergic rhinitis   . Complication of anesthesia   . Elevated transaminase level 12.2014   ?etiology - present 09/2013 hosp for epigastric pain  . Family history of adverse reaction to anesthesia    father - PONV  . GERD (gastroesophageal reflux disease)   . History of bronchitis 10/2014  . Migraine   . PONV (postoperative nausea and vomiting)   . Seasonal allergies     Past Surgical History:  Procedure Laterality Date  . BREAST BIOPSY Right   . BREAST LUMPECTOMY WITH RADIOACTIVE SEED LOCALIZATION Right 10/08/2016   Procedure: RIGHT BREAST LUMPECTOMY WITH RADIOACTIVE SEED LOCALIZATION;  Surgeon: Coralie Keens, MD;  Location: [facility];  Service: General;  Laterality: Right;  . CHOLECYSTECTOMY N/A 09/21/2015   Procedure: LAPAROSCOPIC CHOLECYSTECTOMY;  Surgeon: Coralie Keens, MD;  Location: [facility];  Service: General;  Laterality: N/A;  . COLONOSCOPY  12/2013  .  NECK SURGERY  2009  . PARTIAL HYSTERECTOMY  2007  . UPPER GI ENDOSCOPY  2016    Social History   Social History  . Marital status: Divorced    Spouse name: N/A  . Number of children: N/A  . Years of education: N/A   Occupational History  . RN    Social History Main Topics  . Smoking status: Never Smoker  . Smokeless tobacco: Never Used  . Alcohol use Yes     Comment: occ  . Drug use: No  . Sexual activity: Not Asked   Other Topics Concern  . None   Social History Narrative   Exercise: on average 3/week - running, weights sometimes    Family History  Problem Relation Age  of Onset  . Atrial fibrillation Father   . Diabetes type II Father   . Hypertension Father   . Coronary artery disease Father 61       stent x 2, AoVR  . Hypertension Mother   . Dementia Mother   . Hypertension Brother     Review of Systems  Constitutional: Negative for chills, fatigue and fever.  Eyes: Negative for visual disturbance.  Respiratory: Negative for cough, shortness of breath and wheezing.   Cardiovascular: Negative for chest pain, palpitations and leg swelling.  Gastrointestinal: Positive for constipation. Negative for abdominal pain and blood in stool.       GERD controlled  Genitourinary: Negative for dysuria and hematuria.  Musculoskeletal: Negative for arthralgias and back pain.  Skin: Negative for color change and rash.  Neurological: Positive for headaches. Negative for dizziness and light-headedness.  Psychiatric/Behavioral: Positive for sleep disturbance. Negative for dysphoric mood. The patient is not nervous/anxious.        Objective:   Vitals:   06/06/17 1503  BP: 110/70  Pulse: 67  Temp: (!) 97.5 F (36.4 C)  SpO2: 100%   Filed Weights   06/06/17 1503  Weight: 132 lb (59.9 kg)   Body mass index is 22.66 kg/m.  Wt Readings from Last 3 Encounters:  06/06/17 132 lb (59.9 kg)  04/03/17 126 lb 12.8 oz (57.5 kg)  03/14/17 133 lb (60.3 kg)     Physical Exam Constitutional: She appears well-developed and well-nourished. No distress.  HENT:  Head: Normocephalic and atraumatic.  Right Ear: External ear normal. Normal ear canal and TM Left Ear: External ear normal.  Normal ear canal and TM Mouth/Throat: Oropharynx is clear and moist.  Eyes: Conjunctivae and EOM are normal.  Neck: Neck supple. No tracheal deviation present. No thyromegaly present.  No carotid bruit  Cardiovascular: Normal rate, regular rhythm and normal heart sounds.   No murmur heard.  No edema. Pulmonary/Chest: Effort normal and breath sounds normal. No respiratory  distress. She has no wheezes. She has no rales.  Breast: deferred to Gyn Abdominal: Soft. She exhibits no distension. There is no tenderness.  Lymphadenopathy: She has no cervical adenopathy.  Skin: Skin is warm and dry. She is not diaphoretic.  Psychiatric: She has a normal mood and affect. Her behavior is normal.   .      Assessment & Plan:   Physical exam: Screening blood work  ordered Immunizations  Up to date  Colonoscopy  Up to date  Mammogram   Up to date  Gyn  Up to date  Eye exams  Up to date  Exercise  Regular  Weight  Normal BMI Skin  - sees derm annually, no concerns Substance abuse    none  See Problem List for Assessment and Plan of chronic medical problems.   FU annually

## 2017-06-05 NOTE — Patient Instructions (Addendum)
Test(s) ordered today. Your results will be released to [facility] (or called to you) after review, usually within 72hours after test completion. If any changes need to be made, you will be notified at that same time.  All other Health Maintenance issues reviewed.   All recommended immunizations and age-appropriate screenings are up-to-date or discussed.  No immunizations administered today.   Medications reviewed and updated.  Change linzess to 1-2 a day. Try trazodone.  Your prescription(s) have been submitted to your pharmacy. Please take as directed and contact our office if you believe you are having problem(s) with the medication(s).   Please followup in one year   Health Maintenance, Female Adopting a healthy lifestyle and getting preventive care can go a long way to promote health and wellness. Talk with your health care provider about what schedule of regular examinations is right for you. This is a good chance for you to check in with your provider about disease prevention and staying healthy. In between checkups, there are plenty of things you can do on your own. Experts have done a lot of research about which lifestyle changes and preventive measures are most likely to keep you healthy. Ask your health care provider for more information. Weight and diet Eat a healthy diet  Be sure to include plenty of vegetables, fruits, low-fat dairy products, and lean protein.  Do not eat a lot of foods high in solid fats, added sugars, or salt.  Get regular exercise. This is one of the most important things you can do for your health. ? Most adults should exercise for at least 150 minutes each week. The exercise should increase your heart rate and make you sweat (moderate-intensity exercise). ? Most adults should also do strengthening exercises at least twice a week. This is in addition to the moderate-intensity exercise.  Maintain a healthy weight  Body mass index (BMI) is a measurement  that can be used to identify possible weight problems. It estimates body fat based on height and weight. Your health care provider can help determine your BMI and help you achieve or maintain a healthy weight.  For females 28 years of age and older: ? A BMI below 18.5 is considered underweight. ? A BMI of 18.5 to 24.9 is normal. ? A BMI of 25 to 29.9 is considered overweight. ? A BMI of 30 and above is considered obese.  Watch levels of cholesterol and blood lipids  You should start having your blood tested for lipids and cholesterol at 54 years of age, then have this test every 5 years.  You may need to have your cholesterol levels checked more often if: ? Your lipid or cholesterol levels are high. ? You are older than 54 years of age. ? You are at high risk for heart disease.  Cancer screening Lung Cancer  Lung cancer screening is recommended for adults 73-94 years old who are at high risk for lung cancer because of a history of smoking.  A yearly low-dose CT scan of the lungs is recommended for people who: ? Currently smoke. ? Have quit within the past 15 years. ? Have at least a 30-pack-year history of smoking. A pack year is smoking an average of one pack of cigarettes a day for 1 year.  Yearly screening should continue until it has been 15 years since you quit.  Yearly screening should stop if you develop a health problem that would prevent you from having lung cancer treatment.  Breast Cancer  Practice  breast self-awareness. This means understanding how your breasts normally appear and feel.  It also means doing regular breast self-exams. Let your health care provider know about any changes, no matter how small.  If you are in your 20s or 30s, you should have a clinical breast exam (CBE) by a health care provider every 1-3 years as part of a regular health exam.  If you are 64 or older, have a CBE every year. Also consider having a breast X-ray (mammogram) every  year.  If you have a family history of breast cancer, talk to your health care provider about genetic screening.  If you are at high risk for breast cancer, talk to your health care provider about having an MRI and a mammogram every year.  Breast cancer gene (BRCA) assessment is recommended for women who have family members with BRCA-related cancers. BRCA-related cancers include: ? Breast. ? Ovarian. ? Tubal. ? Peritoneal cancers.  Results of the assessment will determine the need for genetic counseling and BRCA1 and BRCA2 testing.  Cervical Cancer Your health care provider may recommend that you be screened regularly for cancer of the pelvic organs (ovaries, uterus, and vagina). This screening involves a pelvic examination, including checking for microscopic changes to the surface of your cervix (Pap test). You may be encouraged to have this screening done every 3 years, beginning at age 55.  For women ages 48-65, health care providers may recommend pelvic exams and Pap testing every 3 years, or they may recommend the Pap and pelvic exam, combined with testing for human papilloma virus (HPV), every 5 years. Some types of HPV increase your risk of cervical cancer. Testing for HPV may also be done on women of any age with unclear Pap test results.  Other health care providers may not recommend any screening for nonpregnant women who are considered low risk for pelvic cancer and who do not have symptoms. Ask your health care provider if a screening pelvic exam is right for you.  If you have had past treatment for cervical cancer or a condition that could lead to cancer, you need Pap tests and screening for cancer for at least 20 years after your treatment. If Pap tests have been discontinued, your risk factors (such as having a new sexual partner) need to be reassessed to determine if screening should resume. Some women have medical problems that increase the chance of getting cervical cancer. In  these cases, your health care provider may recommend more frequent screening and Pap tests.  Colorectal Cancer  This type of cancer can be detected and often prevented.  Routine colorectal cancer screening usually begins at 54 years of age and continues through 54 years of age.  Your health care provider may recommend screening at an earlier age if you have risk factors for colon cancer.  Your health care provider may also recommend using home test kits to check for hidden blood in the stool.  A small camera at the end of a tube can be used to examine your colon directly (sigmoidoscopy or colonoscopy). This is done to check for the earliest forms of colorectal cancer.  Routine screening usually begins at age 60.  Direct examination of the colon should be repeated every 5-10 years through 54 years of age. However, you may need to be screened more often if early forms of precancerous polyps or small growths are found.  Skin Cancer  Check your skin from head to toe regularly.  Tell your health care provider  about any new moles or changes in moles, especially if there is a change in a mole's shape or color.  Also tell your health care provider if you have a mole that is larger than the size of a pencil eraser.  Always use sunscreen. Apply sunscreen liberally and repeatedly throughout the day.  Protect yourself by wearing long sleeves, pants, a wide-brimmed hat, and sunglasses whenever you are outside.  Heart disease, diabetes, and high blood pressure  High blood pressure causes heart disease and increases the risk of stroke. High blood pressure is more likely to develop in: ? People who have blood pressure in the high end of the normal range (130-139/85-89 mm Hg). ? People who are overweight or obese. ? People who are African American.  If you are 37-85 years of age, have your blood pressure checked every 3-5 years. If you are 76 years of age or older, have your blood pressure  checked every year. You should have your blood pressure measured twice-once when you are at a hospital or clinic, and once when you are not at a hospital or clinic. Record the average of the two measurements. To check your blood pressure when you are not at a hospital or clinic, you can use: ? An automated blood pressure machine at a pharmacy. ? A home blood pressure monitor.  If you are between 81 years and 1 years old, ask your health care provider if you should take aspirin to prevent strokes.  Have regular diabetes screenings. This involves taking a blood sample to check your fasting blood sugar level. ? If you are at a normal weight and have a low risk for diabetes, have this test once every three years after 54 years of age. ? If you are overweight and have a high risk for diabetes, consider being tested at a younger age or more often. Preventing infection Hepatitis B  If you have a higher risk for hepatitis B, you should be screened for this virus. You are considered at high risk for hepatitis B if: ? You were born in a country where hepatitis B is common. Ask your health care provider which countries are considered high risk. ? Your parents were born in a high-risk country, and you have not been immunized against hepatitis B (hepatitis B vaccine). ? You have HIV or AIDS. ? You use needles to inject street drugs. ? You live with someone who has hepatitis B. ? You have had sex with someone who has hepatitis B. ? You get hemodialysis treatment. ? You take certain medicines for conditions, including cancer, organ transplantation, and autoimmune conditions.  Hepatitis C  Blood testing is recommended for: ? Everyone born from [birth year] through 1963. ? Anyone with known risk factors for hepatitis C.  Sexually transmitted infections (STIs)  You should be screened for sexually transmitted infections (STIs) including gonorrhea and chlamydia if: ? You are sexually active and are younger than  54 years of age. ? You are older than 54 years of age and your health care provider tells you that you are at risk for this type of infection. ? Your sexual activity has changed since you were last screened and you are at an increased risk for chlamydia or gonorrhea. Ask your health care provider if you are at risk.  If you do not have HIV, but are at risk, it may be recommended that you take a prescription medicine daily to prevent HIV infection. This is called pre-exposure prophylaxis (PrEP). You are  considered at risk if: ? You are sexually active and do not regularly use condoms or know the HIV status of your partner(s). ? You take drugs by injection. ? You are sexually active with a partner who has HIV.  Talk with your health care provider about whether you are at high risk of being infected with HIV. If you choose to begin PrEP, you should first be tested for HIV. You should then be tested every 3 months for as long as you are taking PrEP. Pregnancy  If you are premenopausal and you may become pregnant, ask your health care provider about preconception counseling.  If you may become pregnant, take 400 to 800 micrograms (mcg) of folic acid every day.  If you want to prevent pregnancy, talk to your health care provider about birth control (contraception). Osteoporosis and menopause  Osteoporosis is a disease in which the bones lose minerals and strength with aging. This can result in serious bone fractures. Your risk for osteoporosis can be identified using a bone density scan.  If you are 45 years of age or older, or if you are at risk for osteoporosis and fractures, ask your health care provider if you should be screened.  Ask your health care provider whether you should take a calcium or vitamin D supplement to lower your risk for osteoporosis.  Menopause may have certain physical symptoms and risks.  Hormone replacement therapy may reduce some of these symptoms and risks. Talk to  your health care provider about whether hormone replacement therapy is right for you. Follow these instructions at home:  Schedule regular health, dental, and eye exams.  Stay current with your immunizations.  Do not use any tobacco products including cigarettes, chewing tobacco, or electronic cigarettes.  If you are pregnant, do not drink alcohol.  If you are breastfeeding, limit how much and how often you drink alcohol.  Limit alcohol intake to no more than 1 drink per day for nonpregnant women. One drink equals 12 ounces of beer, 5 ounces of wine, or 1 ounces of hard liquor.  Do not use street drugs.  Do not share needles.  Ask your health care provider for help if you need support or information about quitting drugs.  Tell your health care provider if you often feel depressed.  Tell your health care provider if you have ever been abused or do not feel safe at home. This information is not intended to replace advice given to you by your health care provider. Make sure you discuss any questions you have with your health care provider. Document Released: 04/22/2011 Document Revised: 03/14/2016 Document Reviewed: 07/11/2015 Elsevier Interactive Patient Education  [employer].

## 2017-06-06 ENCOUNTER — Encounter: Payer: Self-pay | Admitting: Internal Medicine

## 2017-06-06 ENCOUNTER — Ambulatory Visit (INDEPENDENT_AMBULATORY_CARE_PROVIDER_SITE_OTHER): Payer: 59 | Admitting: Internal Medicine

## 2017-06-06 VITALS — BP 110/70 | HR 67 | Temp 97.5°F | Ht 64.0 in | Wt 132.0 lb

## 2017-06-06 DIAGNOSIS — K219 Gastro-esophageal reflux disease without esophagitis: Secondary | ICD-10-CM | POA: Diagnosis not present

## 2017-06-06 DIAGNOSIS — G479 Sleep disorder, unspecified: Secondary | ICD-10-CM | POA: Diagnosis not present

## 2017-06-06 DIAGNOSIS — Z0000 Encounter for general adult medical examination without abnormal findings: Secondary | ICD-10-CM | POA: Diagnosis not present

## 2017-06-06 DIAGNOSIS — G43009 Migraine without aura, not intractable, without status migrainosus: Secondary | ICD-10-CM | POA: Diagnosis not present

## 2017-06-06 DIAGNOSIS — K5900 Constipation, unspecified: Secondary | ICD-10-CM | POA: Insufficient documentation

## 2017-06-06 MED ORDER — HYDROCORTISONE 2.5 % RE CREA: 1.0000 "application " | TOPICAL_CREAM | Freq: Two times a day (BID) | RECTAL | 11 refills | Status: DC

## 2017-06-06 MED ORDER — TRAZODONE HCL 50 MG PO TABS: 50.0000 mg | ORAL_TABLET | Freq: Every evening | ORAL | 3 refills | Status: DC | PRN

## 2017-06-06 MED ORDER — LINACLOTIDE 72 MCG PO CAPS: ORAL_CAPSULE | ORAL | 3 refills | Status: DC

## 2017-06-06 MED FILL — PROCTO-MED HC 2.5% CREAM: 2.5 | 20 days supply | Qty: 30 | Fill #0

## 2017-06-06 MED FILL — traZODone HCL 50 MG TABS: 50 | 30 days supply | Qty: 30 | Fill #0

## 2017-06-06 NOTE — Assessment & Plan Note (Signed)
Related to stress, hormone changes and pain Sonata minimally effective Will try trazodone

## 2017-06-06 NOTE — Assessment & Plan Note (Signed)
Controlled with relpax and fioricet prn Multiple triggers for migraines

## 2017-06-06 NOTE — Assessment & Plan Note (Signed)
linzess effective - sometimes needs 145 mcg, but not daily Will change prescription to 1-2 72 mcg daily

## 2017-06-06 NOTE — Assessment & Plan Note (Signed)
GERD controlled Continue daily medication  

## 2017-06-11 ENCOUNTER — Other Ambulatory Visit (INDEPENDENT_AMBULATORY_CARE_PROVIDER_SITE_OTHER): Payer: 59

## 2017-06-11 ENCOUNTER — Encounter: Payer: Self-pay | Admitting: Internal Medicine

## 2017-06-11 DIAGNOSIS — Z0000 Encounter for general adult medical examination without abnormal findings: Secondary | ICD-10-CM

## 2017-06-11 LAB — LIPID PANEL
Cholesterol: 226 mg/dL — ABNORMAL HIGH (ref 0–200)
HDL: 83 mg/dL (ref >39.00)
LDL Cholesterol: 127 mg/dL — ABNORMAL HIGH (ref 0–99)
NonHDL: 142.88
Total CHOL/HDL Ratio: 3
Triglycerides: 81 mg/dL (ref 0.0–149.0)
VLDL: 16.2 mg/dL (ref 0.0–40.0)

## 2017-06-11 LAB — COMPREHENSIVE METABOLIC PANEL
ALT: 8 U/L (ref 0–35)
AST: 11 U/L (ref 0–37)
Albumin: 4 g/dL (ref 3.5–5.2)
Alkaline Phosphatase: 72 U/L (ref 39–117)
BUN: 11 mg/dL (ref 6–23)
CO2: 30 mEq/L (ref 19–32)
Calcium: 9.3 mg/dL (ref 8.4–10.5)
Chloride: 101 mEq/L (ref 96–112)
Creatinine, Ser: 0.91 mg/dL (ref 0.40–1.20)
GFR: 68.46 mL/min (ref >60.00)
Glucose, Bld: 85 mg/dL (ref 70–99)
Potassium: 4.4 mEq/L (ref 3.5–5.1)
Sodium: 138 mEq/L (ref 135–145)
Total Bilirubin: 0.3 mg/dL (ref 0.2–1.2)
Total Protein: 6.8 g/dL (ref 6.0–8.3)

## 2017-06-11 LAB — CBC WITH DIFFERENTIAL/PLATELET
Basophils Absolute: 0 10*3/uL (ref 0.0–0.1)
Basophils Relative: 0.9 % (ref 0.0–3.0)
Eosinophils Absolute: 0.1 10*3/uL (ref 0.0–0.7)
Eosinophils Relative: 3.1 % (ref 0.0–5.0)
HCT: 39.2 % (ref 36.0–46.0)
Hemoglobin: 13.2 g/dL (ref 12.0–15.0)
Lymphocytes Relative: 28.9 % (ref 12.0–46.0)
Lymphs Abs: 1.3 10*3/uL (ref 0.7–4.0)
MCHC: 33.6 g/dL (ref 30.0–36.0)
MCV: 95.9 fl (ref 78.0–100.0)
Monocytes Absolute: 0.5 10*3/uL (ref 0.1–1.0)
Monocytes Relative: 10.7 % (ref 3.0–12.0)
Neutro Abs: 2.6 10*3/uL (ref 1.4–7.7)
Neutrophils Relative %: 56.4 % (ref 43.0–77.0)
Platelets: 288 10*3/uL (ref 150.0–400.0)
RBC: 4.09 Mil/uL (ref 3.87–5.11)
RDW: 13.3 % (ref 11.5–15.5)
WBC: 4.5 10*3/uL (ref 4.0–10.5)

## 2017-06-11 LAB — TSH: TSH: 3.92 u[IU]/mL (ref 0.35–4.50)

## 2017-06-17 ENCOUNTER — Other Ambulatory Visit: Payer: Self-pay | Admitting: Internal Medicine

## 2017-06-17 MED FILL — ELETRIPTAN HBR 40 MG TABLET: 40 | 30 days supply | Qty: 10 | Fill #0

## 2017-06-17 MED FILL — LINZESS 72 MCG CAPSULE: 72 | 30 days supply | Qty: 30 | Fill #5

## 2017-07-01 MED FILL — BUTALB-ACETAMIN-CAFF 50-325: 50-325-40 | 19 days supply | Qty: 75 | Fill #2

## 2017-07-09 MED FILL — MECLIZINE 25 MG TABLET: 25 | 3 days supply | Qty: 9 | Fill #0

## 2017-07-09 MED FILL — ONDANSETRON ODT 8 MG TABLET: 8 | 3 days supply | Qty: 9 | Fill #0

## 2017-07-15 ENCOUNTER — Encounter: Payer: Self-pay | Admitting: Internal Medicine

## 2017-07-15 NOTE — Telephone Encounter (Signed)
Med has not been refill since 2016 pls advise...Mindy [name]

## 2017-07-17 ENCOUNTER — Other Ambulatory Visit: Payer: Self-pay | Admitting: Emergency Medicine

## 2017-07-17 MED ORDER — DEXILANT 60 MG PO CPDR: 60.0000 mg | DELAYED_RELEASE_CAPSULE | Freq: Every day | ORAL | 5 refills | Status: DC

## 2017-07-17 MED FILL — DEXILANT DR 60 MG CAPSULE: 60 | 90 days supply | Qty: 90 | Fill #0

## 2017-07-21 ENCOUNTER — Other Ambulatory Visit: Payer: Self-pay | Admitting: Obstetrics and Gynecology

## 2017-07-21 DIAGNOSIS — Z1231 Encounter for screening mammogram for malignant neoplasm of breast: Secondary | ICD-10-CM

## 2017-07-29 ENCOUNTER — Encounter: Payer: Self-pay | Admitting: Internal Medicine

## 2017-07-30 DIAGNOSIS — H52222 Regular astigmatism, left eye: Secondary | ICD-10-CM | POA: Diagnosis not present

## 2017-07-30 DIAGNOSIS — H52221 Regular astigmatism, right eye: Secondary | ICD-10-CM | POA: Diagnosis not present

## 2017-07-30 DIAGNOSIS — H524 Presbyopia: Secondary | ICD-10-CM | POA: Diagnosis not present

## 2017-07-31 ENCOUNTER — Encounter: Payer: Self-pay | Admitting: Internal Medicine

## 2017-08-01 MED ORDER — LINACLOTIDE 72 MCG PO CAPS: 72.0000 ug | ORAL_CAPSULE | Freq: Every day | ORAL | 3 refills | Status: DC

## 2017-08-01 MED FILL — LINZESS 72 MCG CAPSULE: 72 | 90 days supply | Qty: 90 | Fill #0

## 2017-08-04 ENCOUNTER — Other Ambulatory Visit: Payer: Self-pay | Admitting: Internal Medicine

## 2017-08-04 MED FILL — traZODone HCL 50 MG TABS: 50 | 30 days supply | Qty: 30 | Fill #1

## 2017-08-04 MED FILL — BUTALB-ACETAMIN-CAFF 50-325: 50-325-40 | 19 days supply | Qty: 75 | Fill #3

## 2017-08-13 MED FILL — ELETRIPTAN HBR 40 MG TABLET: 40 | 30 days supply | Qty: 10 | Fill #0

## 2017-08-21 ENCOUNTER — Ambulatory Visit
Admission: RE | Admit: 2017-08-21 | Discharge: 2017-08-21 | Disposition: A | Discharge status: Home / Self Care | Payer: 59 | Source: Ambulatory Visit | Attending: Obstetrics and Gynecology | Admitting: Obstetrics and Gynecology

## 2017-08-21 DIAGNOSIS — Z1231 Encounter for screening mammogram for malignant neoplasm of breast: Secondary | ICD-10-CM | POA: Diagnosis not present

## 2017-09-05 MED FILL — BUTALB-ACETAMIN-CAFF 50-325: 50-325-40 | 19 days supply | Qty: 75 | Fill #4

## 2017-09-05 MED FILL — PREMARIN 0.625 MG TABLET: 0.625 | 90 days supply | Qty: 90 | Fill #2

## 2017-09-05 MED FILL — DYMISTA NASAL SPRAY: 137-50 | 30 days supply | Qty: 23 | Fill #1

## 2017-09-15 MED FILL — ELETRIPTAN HBR 40 MG TABLET: 40 | 30 days supply | Qty: 10 | Fill #1

## 2017-10-17 MED FILL — ELETRIPTAN HBR 40 MG TABLET: 40 | 30 days supply | Qty: 10 | Fill #2

## 2017-10-17 MED FILL — BUTALB-ACETAMIN-CAFF 50-325: 50-325-40 | 19 days supply | Qty: 75 | Fill #5

## 2017-10-17 MED FILL — DEXILANT DR 60 MG CAPSULE: 60 | 90 days supply | Qty: 90 | Fill #1

## 2017-11-22 ENCOUNTER — Encounter: Payer: Self-pay | Admitting: Internal Medicine

## 2017-11-24 ENCOUNTER — Other Ambulatory Visit: Payer: Self-pay

## 2017-11-24 MED ORDER — BUTALBITAL-APAP-CAFFEINE 50-325-40 MG PO TABS: 1.0000 | ORAL_TABLET | Freq: Four times a day (QID) | ORAL | 5 refills | Status: DC | PRN

## 2017-11-24 NOTE — Telephone Encounter (Signed)
Patient is asking for a refill on her butalbital-acetaminophen-caffeine (FIORICET, ESGIC) 50-325-40 MG tablet. Last refill was on 04/03/17 for 75 tablets with 5 refills. Next OV will be 04/03/18 with you.   Please advise if it is ok for her to receive another refill. Thanks!

## 2017-11-24 NOTE — Telephone Encounter (Signed)
Ok to refill her as before

## 2017-11-26 MED FILL — BUTALB-ACETAMIN-CAFF 50-325: 50-325-40 | 19 days supply | Qty: 75 | Fill #0

## 2017-12-04 ENCOUNTER — Other Ambulatory Visit: Payer: Self-pay | Admitting: Internal Medicine

## 2017-12-04 MED FILL — ELETRIPTAN HYDROBROMIDE 40: 40 | 30 days supply | Qty: 6 | Fill #0

## 2018-01-06 MED FILL — PROCTOZONE-HC 2.5 % CREA: 2.5 | 20 days supply | Qty: 30 | Fill #1

## 2018-01-06 MED FILL — PREMARIN 0.625 MG TABLET: 0.625 | 90 days supply | Qty: 90 | Fill #3

## 2018-01-06 MED FILL — BUTALB-ACETAMIN-CAFF 50-325: 50-325-40 | 19 days supply | Qty: 75 | Fill #1

## 2018-01-08 NOTE — Progress Notes (Signed)
Subjective:    Patient ID: Mindy [name], female    DOB: [birth year], 57 y.o.   MRN: [id number]  HPI She is here for an acute visit for ear symptoms.  Her symptoms started 1 week ago  She is experiencing crackling and popping sounds in ears R > L.  She mostly ears it when laying down and swallowing.  She does have allergies and her allergies have started and she thought that is what this was from, but wanted to make sure there is nothing else going on.  She denies any ear pain.  She is experiencing postnasal drip, intermittent sinus pressure, cough typically from postnasal drip in the morning only, some headaches.  She has not had any fever, chills, nasal congestion, sore throat, sinus pain, shortness of breath or wheezing.  Her symptoms are pretty typical of her allergies.  She has taken sudafed, saline, dymista.  Anti-histamines have not helped.  She has not been using the nasal spray consistently.  Medications and allergies reviewed with patient and updated if appropriate.  Patient Active Problem List   Diagnosis Date Noted  . Sleep difficulties 06/06/2017  . Constipation 06/06/2017  . Insomnia 04/03/2017  . 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 Low back pain 02/13/2017  . Right sided sciatica 02/13/2017  . RLQ abdominal pain 08/20/2016  . Anal itching 08/20/2016  . GERD (gastroesophageal reflux disease) 06/05/2016  . Migraine 12/27/2015  . Headache 12/27/2015  . Lateral epicondylitis of left elbow 06/27/2015  . Cervical radiculitis 06/27/2015  . 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 butalbital-acetaminophen-caffeine (FIORICET, ESGIC) 50-325-40 MG tablet Take 1 tablet by mouth every 6 (six) hours as needed for headache. 75 tablet 5  . DEXILANT 60 MG capsule Take 1 capsule (60 mg total) by mouth daily. 30 capsule 5  . DYMISTA 137-50 MCG/ACT  SUSP Place 2 sprays into both nostrils at bedtime. 23 g 11  . eletriptan (RELPAX) 40 MG tablet TAKE 1 TABLET BY MOUTH AS NEEDED FOR MIGRAINE OR HEADACHE. MAY REPEAT IN 2 HOURS IF HEADACHE PERSISTS OR RECURS 10 tablet 2  . estrogens, conjugated, (PREMARIN) 0.625 MG tablet Take 0.625 mg by mouth daily.     . hydrocortisone (ANUSOL-HC) 2.5 % rectal cream Place 1 application rectally 2 (two) times daily. 30 g 11  . linaclotide (LINZESS) 72 MCG capsule Take 1 capsule (72 mcg total) by mouth daily. 180 capsule 3  . Naproxen Sodium (ALEVE) 220 MG CAPS Take 220-440 mg by mouth 2 (two) times daily as needed (pain/headaches).     Marland Kitchen oxymetazoline (AFRIN) 0.05 % nasal spray Place 2 sprays into the nose at bedtime as needed for congestion.    . pseudoephedrine (SUDAFED) 30 MG tablet Take 30 mg by mouth 2 (two) times daily as needed for congestion.    . sodium chloride (OCEAN) 0.65 % nasal spray Place 1 spray into the nose 4 (four) times daily as needed for congestion (and dryness).     No current facility-administered medications on file prior to visit.     Past Medical History:  Diagnosis Date  . Allergic rhinitis   . Complication of anesthesia   . Elevated transaminase level 12.2014   ?etiology - present 09/2013 hosp for epigastric pain  . Family history of adverse reaction to anesthesia    father - PONV  . GERD (gastroesophageal reflux disease)   .  History of bronchitis 10/2014  . Migraine   . PONV (postoperative nausea and vomiting)   . Seasonal allergies     Past Surgical History:  Procedure Laterality Date  . BREAST BIOPSY Right   . BREAST EXCISIONAL BIOPSY    . BREAST LUMPECTOMY WITH RADIOACTIVE SEED LOCALIZATION Right 10/08/2016   Procedure: RIGHT BREAST LUMPECTOMY WITH RADIOACTIVE SEED LOCALIZATION;  Surgeon: Coralie Keens, MD;  Location: [facility];  Service: General;  Laterality: Right;  . CHOLECYSTECTOMY N/A 09/21/2015   Procedure: LAPAROSCOPIC CHOLECYSTECTOMY;  Surgeon: Coralie Keens, MD;  Location: [facility];  Service: General;  Laterality: N/A;  . COLONOSCOPY  12/2013  . NECK SURGERY  2009  . PARTIAL HYSTERECTOMY  2007  . UPPER GI ENDOSCOPY  2016    Social History   Socioeconomic History  . Marital status: Divorced    Spouse name: Not on file  . Number of children: Not on file  . Years of education: Not on file  . Highest education level: Not on file  Occupational History  . Occupation: Therapist, sports  Social Needs  . Financial resource strain: Not on file  . Food insecurity:    Worry: Not on file    Inability: Not on file  . Transportation needs:    Medical: Not on file    Non-medical: Not on file  Tobacco Use  . Smoking status: Never Smoker  . Smokeless tobacco: Never Used  Substance and Sexual Activity  . Alcohol use: Yes    Comment: occ  . Drug use: No  . Sexual activity: Not on file  Lifestyle  . Physical activity:    Days per week: Not on file    Minutes per session: Not on file  . Stress: Not on file  Relationships  . Social connections:    Talks on phone: Not on file    Gets together: Not on file    Attends religious service: Not on file    Active member of club or organization: Not on file    Attends meetings of clubs or organizations: Not on file    Relationship status: Not on file  Other Topics Concern  . Not on file  Social History Narrative   Exercise: on average 3/week - running, weights sometimes    Family History  Problem Relation Age of Onset  . Atrial fibrillation Father   . Diabetes type II Father   . Hypertension Father   . Coronary artery disease Father 90       stent x 2, AoVR  . Hypertension Mother   . Dementia Mother   . Hypertension Brother   . Breast cancer Maternal Aunt   . Breast cancer Maternal Grandmother   . Breast cancer Paternal Grandmother     Review of Systems  Constitutional: Negative for chills and fever.  HENT: Positive for postnasal drip and sinus pressure. Negative for congestion, ear pain  (popping and crackling), sinus pain and sore throat.   Respiratory: Positive for cough (PND - in mornings only). Negative for shortness of breath and wheezing.   Gastrointestinal: Positive for nausea (with severe HA).  Neurological: Positive for dizziness (sometimes) and headaches.       Objective:   Vitals:   01/09/18 1552  BP: 118/78  Pulse: 67  Temp: 97.8 F (36.6 C)  SpO2: 100%   Filed Weights   01/09/18 1552  Weight: 136 lb (61.7 kg)   Body mass index is 23.34 kg/m.  Wt Readings from Last 3 Encounters:  01/09/18 136 lb (61.7 kg)  06/06/17 132 lb (59.9 kg)  04/03/17 126 lb 12.8 oz (57.5 kg)     Physical Exam GENERAL APPEARANCE: Appears stated age, well appearing, NAD EYES: conjunctiva clear, no icterus HEENT: bilateral tympanic membranes and ear canals normal, oropharynx with no erythema, no thyromegaly, trachea midline, no cervical or supraclavicular lymphadenopathy LUNGS: Clear to auscultation without wheeze or crackles, unlabored breathing, good air entry bilaterally CARDIOVASCULAR: Normal S1,S2 without murmurs, no edema SKIN: warm, dry        Assessment & Plan:   See Problem List for Assessment and Plan of chronic medical problems.

## 2018-01-09 ENCOUNTER — Ambulatory Visit: Payer: 59 | Admitting: Internal Medicine

## 2018-01-09 ENCOUNTER — Encounter: Payer: Self-pay | Admitting: Internal Medicine

## 2018-01-09 VITALS — BP 118/78 | HR 67 | Temp 97.8°F | Ht 64.0 in | Wt 136.0 lb

## 2018-01-09 DIAGNOSIS — J300 Vasomotor rhinitis: Secondary | ICD-10-CM | POA: Diagnosis not present

## 2018-01-09 NOTE — Patient Instructions (Signed)
Continue all your allergy medications.  Be more consistent with the nasal spray.   If your symptoms worsen and you think you need an antibiotic let me know.

## 2018-01-10 NOTE — Assessment & Plan Note (Signed)
Experiencing popping and crackling in her ears, which is new.  She is also experiencing her typical allergy symptoms No evidence of infection.  Ear exam normal.  Your symptoms likely related to allergies Continue allergy medication.  Use nasal spray more consistently Call if symptoms worsen

## 2018-01-21 ENCOUNTER — Other Ambulatory Visit: Payer: Self-pay | Admitting: Internal Medicine

## 2018-01-21 MED FILL — DEXILANT DR 60 MG CAPSULE: 60 | 90 days supply | Qty: 90 | Fill #0

## 2018-01-22 MED FILL — DYMISTA NASAL SPRAY: 137-50 | 30 days supply | Qty: 23 | Fill #2

## 2018-02-09 MED FILL — ELETRIPTAN HYDROBROMIDE 40: 40 | 30 days supply | Qty: 6 | Fill #1

## 2018-02-09 MED FILL — BUTALB-ACETAMIN-CAFF 50-325: 50-325-40 | 19 days supply | Qty: 75 | Fill #2

## 2018-03-13 DIAGNOSIS — Z01419 Encounter for gynecological examination (general) (routine) without abnormal findings: Secondary | ICD-10-CM | POA: Diagnosis not present

## 2018-03-18 ENCOUNTER — Encounter: Payer: Self-pay | Admitting: Internal Medicine

## 2018-03-18 DIAGNOSIS — R635 Abnormal weight gain: Secondary | ICD-10-CM

## 2018-03-18 DIAGNOSIS — R5383 Other fatigue: Secondary | ICD-10-CM

## 2018-03-18 DIAGNOSIS — R232 Flushing: Secondary | ICD-10-CM

## 2018-03-24 ENCOUNTER — Other Ambulatory Visit (INDEPENDENT_AMBULATORY_CARE_PROVIDER_SITE_OTHER): Payer: 59

## 2018-03-24 DIAGNOSIS — R635 Abnormal weight gain: Secondary | ICD-10-CM

## 2018-03-24 DIAGNOSIS — R5383 Other fatigue: Secondary | ICD-10-CM

## 2018-03-24 DIAGNOSIS — R232 Flushing: Secondary | ICD-10-CM | POA: Diagnosis not present

## 2018-03-24 LAB — T4, FREE: Free T4: 0.68 ng/dL (ref 0.60–1.60)

## 2018-03-24 LAB — TSH: TSH: 3.84 u[IU]/mL (ref 0.35–4.50)

## 2018-03-25 ENCOUNTER — Encounter: Payer: Self-pay | Admitting: Internal Medicine

## 2018-03-26 MED FILL — DYMISTA NASAL SPRAY: 137-50 | 30 days supply | Qty: 23 | Fill #3

## 2018-03-26 MED FILL — BUTALB-ACETAMIN-CAFF 50-325: 50-325-40 | 19 days supply | Qty: 75 | Fill #3

## 2018-03-26 MED FILL — ELETRIPTAN HYDROBROMIDE 40: 40 | 30 days supply | Qty: 6 | Fill #2

## 2018-04-01 MED FILL — LINZESS 72 MCG CAPSULE: 72 | 90 days supply | Qty: 90 | Fill #1

## 2018-04-01 MED FILL — PREMARIN 0.9 MG TABLET: 0.9 | 84 days supply | Qty: 84 | Fill #0

## 2018-04-03 ENCOUNTER — Ambulatory Visit: Payer: 59 | Admitting: Internal Medicine

## 2018-04-27 ENCOUNTER — Telehealth: Payer: 59 | Admitting: Family

## 2018-04-27 DIAGNOSIS — R059 Cough, unspecified: Secondary | ICD-10-CM

## 2018-04-27 DIAGNOSIS — J40 Bronchitis, not specified as acute or chronic: Secondary | ICD-10-CM | POA: Diagnosis not present

## 2018-04-27 DIAGNOSIS — R05 Cough: Secondary | ICD-10-CM

## 2018-04-27 MED ORDER — PREDNISONE 10 MG PO TABS: 10.0000 mg | ORAL_TABLET | Freq: Every day | ORAL | 0 refills | Status: DC

## 2018-04-27 MED FILL — predniSONE 10 MG TABS: 10 | 21 days supply | Qty: 21 | Fill #0

## 2018-04-27 NOTE — Progress Notes (Signed)

## 2018-05-06 MED FILL — BUTALB-ACETAMIN-CAFF 50-325: 50-325-40 | 19 days supply | Qty: 75 | Fill #4

## 2018-05-06 MED FILL — DEXILANT DR 60 MG CAPSULE: 60 | 90 days supply | Qty: 90 | Fill #1

## 2018-05-07 ENCOUNTER — Ambulatory Visit: Payer: 59 | Admitting: Family Medicine

## 2018-05-07 ENCOUNTER — Ambulatory Visit (HOSPITAL_BASED_OUTPATIENT_CLINIC_OR_DEPARTMENT_OTHER)
Admission: RE | Admit: 2018-05-07 | Discharge: 2018-05-07 | Disposition: A | Discharge status: Home / Self Care | Payer: 59 | Source: Ambulatory Visit | Attending: Family Medicine | Admitting: Family Medicine

## 2018-05-07 ENCOUNTER — Encounter: Payer: Self-pay | Admitting: Family Medicine

## 2018-05-07 VITALS — BP 126/84 | HR 82 | Ht 64.0 in | Wt 139.4 lb

## 2018-05-07 DIAGNOSIS — M25551 Pain in right hip: Secondary | ICD-10-CM | POA: Insufficient documentation

## 2018-05-07 NOTE — Assessment & Plan Note (Signed)
independently reviewed radiographs and no abnormalities (AVN, arthritis).  Concerning for labral tear of the hip with secondary muscle spasms/strain.  Start physical therapy.  Continue aleve.  Ice or heat if needed.  Avoid running, cutting, jumping.  Consider injection, MRI arthrogram if not improving.  F/u in 1 month to 6 weeks.

## 2018-05-07 NOTE — Patient Instructions (Signed)
Your x-rays are reassuring. Your exam is consistent with a labral tear of your hip but you have some secondary muscle strain/spasms of hip external rotators, hip flexor, hip abductors as well. Start physical therapy and do home exercises on days you don't go to therapy. Aleve 2 tabs twice a day with food for pain and inflammation. Ice/heat if needed. I would avoid running, cutting sports (tennis, basketball) for now but other activities (walking, cycling, swimming) are ok. Consider intraarticular injection, MRI arthrogram if not improving as expected. Follow up with me 1 month to 6 weeks after starting therapy.

## 2018-05-07 NOTE — Progress Notes (Signed)
PCP: Binnie Rail, MD  Subjective:   HPI: Mindy [name] is a 57 y.o. female here for right hip pain.  Mindy [name] reports she's had off and on pain in right hip since October. She had problems with this hip several years ago and was diagnosed with bursitis, given an injection, improved. Denies acute injury but she did trip when running in June, twisted to go toward the grass (but didn't fall) and feels this worsened her current pain. Has a dull ache all the time at 6/10 level but gets sharp pains too. Pain is from groin through to back of hip. No radiation, numbness. Aleve helps. No bowel/bladder dysfunction.  Past Medical History:  Diagnosis Date  . Allergic rhinitis   . Complication of anesthesia   . Elevated transaminase level 12.2014   ?etiology - present 09/2013 hosp for epigastric pain  . Family history of adverse reaction to anesthesia    father - PONV  . GERD (gastroesophageal reflux disease)   . History of bronchitis 10/2014  . Migraine   . PONV (postoperative nausea and vomiting)   . Seasonal allergies     Current Outpatient Medications on File Prior to Visit  Medication Sig Dispense Refill  . butalbital-acetaminophen-caffeine (FIORICET, ESGIC) 50-325-40 MG tablet Take 1 tablet by mouth every 6 (six) hours as needed for headache. 75 tablet 5  . DEXILANT 60 MG capsule TAKE 1 CAPSULE (60 MG TOTAL) BY MOUTH DAILY. 90 capsule 1  . DYMISTA 137-50 MCG/ACT SUSP Place 2 sprays into both nostrils at bedtime. 23 g 11  . eletriptan (RELPAX) 40 MG tablet TAKE 1 TABLET BY MOUTH AS NEEDED FOR MIGRAINE OR HEADACHE. MAY REPEAT IN 2 HOURS IF HEADACHE PERSISTS OR RECURS 10 tablet 2  . estrogens, conjugated, (PREMARIN) 0.625 MG tablet Take 0.625 mg by mouth daily.     . hydrocortisone (ANUSOL-HC) 2.5 % rectal cream Place 1 application rectally 2 (two) times daily. 30 g 11  . linaclotide (LINZESS) 72 MCG capsule Take 1 capsule (72 mcg total) by mouth daily. 180 capsule 3  . Naproxen Sodium  (ALEVE) 220 MG CAPS Take 220-440 mg by mouth 2 (two) times daily as needed (pain/headaches).     Marland Kitchen oxymetazoline (AFRIN) 0.05 % nasal spray Place 2 sprays into the nose at bedtime as needed for congestion.    . pseudoephedrine (SUDAFED) 30 MG tablet Take 30 mg by mouth 2 (two) times daily as needed for congestion.    . sodium chloride (OCEAN) 0.65 % nasal spray Place 1 spray into the nose 4 (four) times daily as needed for congestion (and dryness).     No current facility-administered medications on file prior to visit.     Past Surgical History:  Procedure Laterality Date  . BREAST BIOPSY Right   . BREAST EXCISIONAL BIOPSY    . BREAST LUMPECTOMY WITH RADIOACTIVE SEED LOCALIZATION Right 10/08/2016   Procedure: RIGHT BREAST LUMPECTOMY WITH RADIOACTIVE SEED LOCALIZATION;  Surgeon: Coralie Keens, MD;  Location: [facility];  Service: General;  Laterality: Right;  . CHOLECYSTECTOMY N/A 09/21/2015   Procedure: LAPAROSCOPIC CHOLECYSTECTOMY;  Surgeon: Coralie Keens, MD;  Location: [facility];  Service: General;  Laterality: N/A;  . COLONOSCOPY  12/2013  . NECK SURGERY  2009  . PARTIAL HYSTERECTOMY  2007  . UPPER GI ENDOSCOPY  2016    Allergies  Allergen Reactions  . Oxaprozin Hives and Itching    "daypro"    Social History   Socioeconomic History  . Marital status: Divorced  Spouse name: Not on file  . Number of children: Not on file  . Years of education: Not on file  . Highest education level: Not on file  Occupational History  . Occupation: Therapist, sports  Social Needs  . Financial resource strain: Not on file  . Food insecurity:    Worry: Not on file    Inability: Not on file  . Transportation needs:    Medical: Not on file    Non-medical: Not on file  Tobacco Use  . Smoking status: Never Smoker  . Smokeless tobacco: Never Used  Substance and Sexual Activity  . Alcohol use: Yes    Comment: occ  . Drug use: No  . Sexual activity: Not on file  Lifestyle  . Physical activity:     Days per week: Not on file    Minutes per session: Not on file  . Stress: Not on file  Relationships  . Social connections:    Talks on phone: Not on file    Gets together: Not on file    Attends religious service: Not on file    Active member of club or organization: Not on file    Attends meetings of clubs or organizations: Not on file    Relationship status: Not on file  . Intimate partner violence:    Fear of current or ex partner: Not on file    Emotionally abused: Not on file    Physically abused: Not on file    Forced sexual activity: Not on file  Other Topics Concern  . Not on file  Social History Narrative   Exercise: on average 3/week - running, weights sometimes    Family History  Problem Relation Age of Onset  . Atrial fibrillation Father   . Diabetes type II Father   . Hypertension Father   . Coronary artery disease Father 25       stent x 2, AoVR  . Hypertension Mother   . Dementia Mother   . Hypertension Brother   . Breast cancer Maternal Aunt   . Breast cancer Maternal Grandmother   . Breast cancer Paternal Grandmother     BP 126/84   Pulse 82   Ht 5\' 4"  (1.626 m)   Wt 139 lb 6.4 oz (63.2 kg)   BMI 23.93 kg/m   Review of Systems: See HPI above.     Objective:  Physical Exam:  Gen: NAD, comfortable in exam room  Back: No gross deformity, scoliosis. No TTP.  No midline or bony TTP. FROM. Strength LEs 5/5 all muscle groups.   2+ MSRs in patellar and achilles tendons, equal bilaterally. Negative SLRs. Sensation intact to light touch bilaterally.  Right hip: No deformity. FROM with 5/5 strength. TTP over hip flexor, trochanter, proximal IT band, gluteal muscles, external rotators. NVI distally. Positive logroll. Negative fabers and piriformis stretches.  Positive fadir. Pop with clunk test   Assessment & Plan:  1. Right hip pain - independently reviewed radiographs and no abnormalities (AVN, arthritis).  Concerning for labral tear of  the hip with secondary muscle spasms/strain.  Start physical therapy.  Continue aleve.  Ice or heat if needed.  Avoid running, cutting, jumping.  Consider injection, MRI arthrogram if not improving.  F/u in 1 month to 6 weeks.

## 2018-05-12 ENCOUNTER — Other Ambulatory Visit: Payer: Self-pay

## 2018-05-12 ENCOUNTER — Encounter: Payer: Self-pay | Admitting: Physical Therapy

## 2018-05-12 ENCOUNTER — Ambulatory Visit: Payer: 59 | Attending: Family Medicine | Admitting: Physical Therapy

## 2018-05-12 DIAGNOSIS — M25652 Stiffness of left hip, not elsewhere classified: Secondary | ICD-10-CM | POA: Insufficient documentation

## 2018-05-12 DIAGNOSIS — M25552 Pain in left hip: Secondary | ICD-10-CM | POA: Diagnosis not present

## 2018-05-12 DIAGNOSIS — R262 Difficulty in walking, not elsewhere classified: Secondary | ICD-10-CM | POA: Diagnosis not present

## 2018-05-12 DIAGNOSIS — R252 Cramp and spasm: Secondary | ICD-10-CM | POA: Diagnosis not present

## 2018-05-12 NOTE — Therapy (Signed)
[facility] [address]  [place], [place], [address] Phone: [phone]   Fax:  [phone]  Physical Therapy Evaluation  Patient Details  Name: Mindy [name] MRN: [id number] Date of Birth: October 26, 1960 Referring Provider: Karlton Lemon, MD   Encounter Date: 05/12/2018  PT End of Session - 05/12/18 1808    Visit Number  1    Number of Visits  12    Date for PT Re-Evaluation  06/23/18    PT Start Time  1701    PT Stop Time  1742    PT Time Calculation (min)  41 min    Activity Tolerance  Patient limited by pain;Patient tolerated treatment well    Behavior During Therapy  [facility] for tasks assessed/performed       Past Medical History:  Diagnosis Date  . Allergic rhinitis   . Complication of anesthesia   . Elevated transaminase level 12.2014   ?etiology - present 09/2013 hosp for epigastric pain  . Family history of adverse reaction to anesthesia    father - PONV  . GERD (gastroesophageal reflux disease)   . History of bronchitis 10/2014  . Migraine   . PONV (postoperative nausea and vomiting)   . Seasonal allergies     Past Surgical History:  Procedure Laterality Date  . BREAST BIOPSY Right   . BREAST EXCISIONAL BIOPSY    . BREAST LUMPECTOMY WITH RADIOACTIVE SEED LOCALIZATION Right 10/08/2016   Procedure: RIGHT BREAST LUMPECTOMY WITH RADIOACTIVE SEED LOCALIZATION;  Surgeon: Coralie Keens, MD;  Location: [place];  Service: General;  Laterality: Right;  . CHOLECYSTECTOMY N/A 09/21/2015   Procedure: LAPAROSCOPIC CHOLECYSTECTOMY;  Surgeon: Coralie Keens, MD;  Location: [place];  Service: General;  Laterality: N/A;  . COLONOSCOPY  12/2013  . NECK SURGERY  2009  . PARTIAL HYSTERECTOMY  2007  . UPPER GI ENDOSCOPY  2016    There were no vitals filed for this visit.   Subjective Assessment - 05/12/18 1712    Subjective  Has had problems with the R hip off and on for a while, most recent flare up was in June.   Pt said she was running and fell, but instead of falling forward pt twisted and fell on to the grass off to the right. Has been taking Aleve on and off for the past two weeks, and before that was on prednisone secondary to bronchitits. Pt expressed that when she was on the prednisone she had decrease pain in the hip and was able to go hiking and remain active. Since stopping prednisone has noticed much more pain in the hip. Pt stated that she has had pain in the R hip since she was about 57 years old after traumatic accident where she was "run over with a tractor". Before injury pt was walking a 5k distance 3-4 times per week but since flare-up has been walking much more slowly and with less distance. Recently pt has been waking up during the night with pain in the R hip at a 7/10.      Limitations  Walking;Sitting    How long can you sit comfortably?  Tolerance gets worse as the day progresses; at end of the day just sitting at all is painful    How long can you walk comfortably?  20-30 min    Patient Stated Goals  to decrease pain and return to active lifestyle    Currently in Pain?  Yes    Pain Score  4  7 at worst with sitting    Pain Location  Hip    Pain Orientation  Right    Pain Descriptors / Indicators  Aching;Sharp    Pain Radiating Towards  Into the R groin area    Pain Onset  More than a month ago    Pain Frequency  Intermittent With certain movements    Aggravating Factors   Sitting    Pain Relieving Factors  ice and heat with no lasting effect     Effect of Pain on Daily Activities  Decreased walking distance, decreased sitting tolerance at work         [facility] PT Assessment - 05/12/18 0001      Assessment   Medical Diagnosis  R hip labral tear    Referring Provider  Karlton Lemon, MD    Next MD Visit  In about 6 weeks    Prior Therapy  Yes      Balance Screen   Has the patient fallen in the past 6 months  No    Has the patient had a decrease in activity level because of a  fear of falling?   No    Is the patient reluctant to leave their home because of a fear of falling?   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    Leisure  Running, going to gym with [employer] for toning, hiking, photography       Observation/Other Assessments   Focus on Therapeutic Outcomes (FOTO)   L hip - Intake 49%, 51% limited; Predicted - status 64%, limitation 36%      Functional Tests   Functional tests  Squat      Squat   Comments  Pt able to achieve ~50% of anticipated squat depth and stopped secondary to pain       ROM / Strength   AROM / PROM / Strength  Strength;AROM      AROM   Lumbar Flexion  WNL    Lumbar Extension  WNL    Lumbar - Right Side Bend  WNL    Lumbar - Left Side Bend  WNL    Lumbar - Right Rotation  WNL, + for pain in L hip    Lumbar - Left Rotation  WNL, + for pain in L hip      Strength   Overall Strength Comments  Deferred further strength testing due to increased pain with testing    Strength Assessment Site  Hip;Knee    Right/Left Hip  Right;Left    Right Hip Flexion  -- + for pain in L hip, unable to assess    Left Hip Flexion  -- + for pain, no MMT assessed    Right/Left Knee  Right;Left    Right Knee Flexion  5/5    Right Knee Extension  5/5    Left Knee Flexion  5/5    Left Knee Extension  5/5      Palpation   Palpation comment  TTP with very light palpation along rectus femoris, insertion of iliopsoas, TFL,  and distal portion of gluteus musculature.       Special Tests    Special Tests  Hip Special Tests    Hip Special Tests   Hip Scouring      Hip Scouring   Findings  Positive    Side  Left    Comments  + for pain along inferiomedial aspect  of acetabulum                 Objective measurements completed on examination: See above findings.      [facility] Adult PT Treatment/Exercise - 05/12/18 0001      Lumbar Exercises: Stretches   Quad Stretch  Left;3 reps;30 seconds    Quad Stretch Limitations  L sidelying using stretch strap for knee flexion; Pillow placed between knees during stretch.     Figure 4 Stretch  3 reps;30 seconds    Figure 4 Stretch Limitations  R LE stretch, supine with R ankle resting on L knee to tolerance             PT Education - 05/12/18 1805    Education Details  Pt educated on results of evaluation, HEP stretches, and Estim for pain modulation although pt deferred estim today.     Person(s) Educated  Patient    Methods  Explanation;Tactile cues;Verbal cues;Handout    Comprehension  Verbalized understanding;Returned demonstration       PT Short Term Goals - 05/12/18 1837      PT SHORT TERM GOAL #1   Title  Pt will be independent with initial HEP program     Status  New    [place] Date  06/02/18      PT SHORT TERM GOAL #2   Title  Pt will report pain at night no greater than 4/10    Status  New    Target Date  06/02/18        PT Long Term Goals - 05/12/18 1837      PT LONG TERM GOAL #1   Title  Pt will be independent with advanced HEP program    Status  New    Target Date  06/23/18      PT LONG TERM GOAL #2   Title  Pt will demonstrate good squat mechanics with no increase in pain to improve tolerance to functional activities     Status  New    Target Date  06/23/18      PT LONG TERM GOAL #3   Title  Pt will report ability to walk 5k distance 3x/week to improve ability to perform recreational activities    Status  New    Target Date  06/23/18      PT LONG TERM GOAL #4   Title  Pt will report an improvement in pain/symptoms to > or = to 50% to demonstrate improved functional status and ability to participate in ADLs    Status  New    Target Date  06/23/18       PT LONG TERM GOAL #5   Title  Pt will report sitting tolerance of >1 hour to improve ability to perform work-related activities    Status  New    Target Date  06/23/18             Plan - 05/12/18 1833    Clinical Impression Statement  Pt is a 57 y/o F who presents to OP PT with c/c of L hip pain with potential labral tear. Examination revealed decreased ROM  in all directions and TTP along global R hip musculature. These impairments are limiting pt's ability to tolerate ADLs including sitting, and recreational activities including a running and walking regimen including walking a 5k distance 3-4 times per week. 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 Initial therapeutic interventions and assessment limited due to increased pain and inability to tolerate certain positions secondary to pain, but will be further assessed and progressed next visit.     Clinical Presentation  Stable    Clinical Decision Making  Low    Rehab Potential  Good    PT Frequency  2x / week    PT Duration  6 weeks    PT Treatment/Interventions  ADLs/Self Care Home Management;Cryotherapy;Electrical Stimulation;Iontophoresis 4mg /ml Dexamethasone;Moist Heat;Ultrasound;Stair training;Functional mobility training;Therapeutic activities;Therapeutic exercise;Neuromuscular re-education;Patient/family education;Manual techniques;Passive range of motion;Dry needling;Taping    PT Next Visit Plan  Progress HEP exercises    Consulted and Agree with Plan of Care  Patient       Patient will benefit from skilled therapeutic intervention in order to improve the following deficits and impairments:  Decreased activity tolerance, Pain, Decreased mobility, Increased muscle spasms, Decreased range of motion, Improper body mechanics, Impaired flexibility, Difficulty walking  Visit Diagnosis: Pain in left hip  Cramp and  spasm  Difficulty in walking, not elsewhere classified  Stiffness of left hip, not elsewhere classified     Problem List Patient Active Problem List   Diagnosis Date Noted  . Right hip pain 05/07/2018  . Sleep difficulties 06/06/2017  . Constipation 06/06/2017  . Insomnia 04/03/2017  . 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 Low back pain 02/13/2017  . Right sided sciatica 02/13/2017  . RLQ abdominal pain 08/20/2016  . Anal itching 08/20/2016  . GERD (gastroesophageal reflux disease) 06/05/2016  . Migraine 12/27/2015  . Headache 12/27/2015  . Lateral epicondylitis of left elbow 06/27/2015  . Cervical radiculitis 06/27/2015  . Non-allergic vasomotor rhinitis 06/01/2012    Shirline Frees, SPT  05/12/2018, 7:05 PM  [facility] [address]  [place], [place], [address] Phone: [phone]   Fax:  [phone]  Name: Mindy [name] MRN: [id number] Date of Birth: 07-30-1961

## 2018-05-14 ENCOUNTER — Ambulatory Visit: Payer: 59 | Admitting: Physical Therapy

## 2018-05-14 ENCOUNTER — Encounter: Payer: Self-pay | Admitting: Physical Therapy

## 2018-05-14 DIAGNOSIS — M25552 Pain in left hip: Secondary | ICD-10-CM | POA: Diagnosis not present

## 2018-05-14 DIAGNOSIS — R262 Difficulty in walking, not elsewhere classified: Secondary | ICD-10-CM | POA: Diagnosis not present

## 2018-05-14 DIAGNOSIS — M25652 Stiffness of left hip, not elsewhere classified: Secondary | ICD-10-CM

## 2018-05-14 DIAGNOSIS — R252 Cramp and spasm: Secondary | ICD-10-CM | POA: Diagnosis not present

## 2018-05-14 NOTE — Therapy (Signed)
[facility] [address]  [place], [place], [address] Phone: [phone]   Fax:  [phone]  Physical Therapy Treatment  Patient Details  Name: Mindy [name] MRN: [id number] Date of Birth: 04-14-1961 Referring Provider: Karlton Lemon, MD   Encounter Date: 05/14/2018  PT End of Session - 05/14/18 0845    Visit Number  2    Number of Visits  12    Date for PT Re-Evaluation  06/23/18    Authorization Type  [facility]    PT Start Time  0845    PT Stop Time  0932    PT Time Calculation (min)  47 min    Activity Tolerance  Patient tolerated treatment well;Patient limited by pain    Behavior During Therapy  [facility] for tasks assessed/performed       Past Medical History:  Diagnosis Date  . Allergic rhinitis   . Complication of anesthesia   . Elevated transaminase level 12.2014   ?etiology - present 09/2013 hosp for epigastric pain  . Family history of adverse reaction to anesthesia    father - PONV  . GERD (gastroesophageal reflux disease)   . History of bronchitis 10/2014  . Migraine   . PONV (postoperative nausea and vomiting)   . Seasonal allergies     Past Surgical History:  Procedure Laterality Date  . BREAST BIOPSY Right   . BREAST EXCISIONAL BIOPSY    . BREAST LUMPECTOMY WITH RADIOACTIVE SEED LOCALIZATION Right 10/08/2016   Procedure: RIGHT BREAST LUMPECTOMY WITH RADIOACTIVE SEED LOCALIZATION;  Surgeon: Coralie Keens, MD;  Location: [facility];  Service: General;  Laterality: Right;  . CHOLECYSTECTOMY N/A 09/21/2015   Procedure: LAPAROSCOPIC CHOLECYSTECTOMY;  Surgeon: Coralie Keens, MD;  Location: [place];  Service: General;  Laterality: N/A;  . COLONOSCOPY  12/2013  . NECK SURGERY  2009  . PARTIAL HYSTERECTOMY  2007  . UPPER GI ENDOSCOPY  2016    There were no vitals filed for this visit.  Subjective Assessment - 05/14/18 0846    Subjective  Pt reporting minimal pain at present, but states she took Aleve  this morning and has not been very active yet. States pain typically increases as the day goes on.    Patient Stated Goals  to decrease pain and return to active lifestyle    Currently in Pain?  Yes    Pain Score  1     Pain Location  Hip    Pain Orientation  Right    Pain Descriptors / Indicators  Sore                       [facility] Adult PT Treatment/Exercise - 05/14/18 0845      Exercises   Exercises  Knee/Hip      Lumbar Exercises: Supine   Glut Set  10 reps 10 sec    Glut Set Limitations  isometric hip extension in hooklying on bolster - initially 50% effort, gradually increased per pt tolerance    Isometric Hip Flexion  10 reps 10 sec hold    Isometric Hip Flexion Limitations  with orange Pball - initially 100% effort, but reduced to 50% effort d/t increased       Knee/Hip Exercises: Aerobic   Nustep  L2 x 5 min (B UE/LE)      Manual Therapy   Manual Therapy  Soft tissue mobilization;Myofascial release;Passive ROM;Taping    Manual therapy comments  slight hooklying & L  sidelying with pillow btw knees    Soft tissue mobilization  gentle STM to R proximal quads, distal iliopsoas, TFL, ITB, glutes & piriformis    Myofascial Release  gentle MFR and TPR to R RF, TFL/ITB, glutes & piriformis    Passive ROM  gentle manual hip flexor stretch in mod thomas positon & R ITB in sidelying    Kinesiotex  Inhibit Muscle      Kinesiotix   Inhibit Muscle   30% R ITB & glutes             PT Education - 05/14/18 0930    Education Details  HEP update - hip isometrics; Kinesiotape wearing instructions    Person(s) Educated  Patient    Methods  Explanation;Demonstration;Handout    Comprehension  Verbalized understanding;Returned demonstration       PT Short Term Goals - 05/14/18 0848      PT SHORT TERM GOAL #1   Title  Pt will be independent with initial HEP program     Status  On-going      PT SHORT TERM GOAL #2   Title  Pt will report pain at night no greater  than 4/10    Status  On-going        PT Long Term Goals - 05/14/18 0848      PT LONG TERM GOAL #1   Title  Pt will be independent with advanced HEP program    Status  On-going      PT LONG TERM GOAL #2   Title  Pt will demonstrate good squat mechanics with no increase in pain to improve tolerance to functional activities     Status  On-going      PT LONG TERM GOAL #3   Title  Pt will report ability to walk 5k distance 3x/week to improve ability to perform recreational activities    Status  On-going      PT LONG TERM GOAL #4   Title  Pt will report an improvement in pain/symptoms to > or = to 50% to demonstrate improved functional status and ability to participate in ADLs    Status  On-going      PT LONG TERM GOAL #5   Title  Pt will report sitting tolerance of >1 hour to improve ability to perform work-related activities    Status  On-going            Plan - 05/14/18 0848    Clinical Impression Statement  Mindy [name] reporting no issues with initial HEP stretches but not noting much change/pain relief from stretches. She remains very TTP throughout R hip and proximal thigh musculature with limited tolerance for manual therapy and stretching, therefore initiated trial of kinesiotapting to R ITB and glutes to promote muscle relaxation and decreased pain. Initiated isometric muscle strengthening focusing on maximal effort w/o increased pain, with HEP updated accordingly.    Rehab Potential  Good    PT Treatment/Interventions  ADLs/Self Care Home Management;Cryotherapy;Electrical Stimulation;Iontophoresis 4mg /ml Dexamethasone;Moist Heat;Ultrasound;Stair training;Functional mobility training;Therapeutic activities;Therapeutic exercise;Neuromuscular re-education;Patient/family education;Manual techniques;Passive range of motion;Dry needling;Taping    PT Next Visit Plan  Progress HEP exercises    Consulted and Agree with Plan of Care  Patient       Patient will benefit from skilled  therapeutic intervention in order to improve the following deficits and impairments:  Decreased activity tolerance, Pain, Decreased mobility, Increased muscle spasms, Decreased range of motion, Improper body mechanics, Impaired flexibility, Difficulty walking  Visit Diagnosis: Pain in  left hip  Cramp and spasm  Difficulty in walking, not elsewhere classified  Stiffness of left hip, not elsewhere classified     Problem List Patient Active Problem List   Diagnosis Date Noted  . Right hip pain 05/07/2018  . Sleep difficulties 06/06/2017  . Constipation 06/06/2017  . Insomnia 04/03/2017  . 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 Low back pain 02/13/2017  . Right sided sciatica 02/13/2017  . RLQ abdominal pain 08/20/2016  . Anal itching 08/20/2016  . GERD (gastroesophageal reflux disease) 06/05/2016  . Migraine 12/27/2015  . Headache 12/27/2015  . Lateral epicondylitis of left elbow 06/27/2015  . Cervical radiculitis 06/27/2015  . Non-allergic vasomotor rhinitis 06/01/2012    Percival Spanish, PT, MPT 05/14/2018, 10:37 AM  [facility] [address]  [place], [place], [address] Phone: [phone]   Fax:  [phone]  Name: Mindy [name] MRN: [id number] Date of Birth: July 27, 1961

## 2018-05-19 ENCOUNTER — Ambulatory Visit: Payer: 59

## 2018-05-19 DIAGNOSIS — M25652 Stiffness of left hip, not elsewhere classified: Secondary | ICD-10-CM

## 2018-05-19 DIAGNOSIS — R252 Cramp and spasm: Secondary | ICD-10-CM | POA: Diagnosis not present

## 2018-05-19 DIAGNOSIS — M25552 Pain in left hip: Secondary | ICD-10-CM | POA: Diagnosis not present

## 2018-05-19 DIAGNOSIS — R262 Difficulty in walking, not elsewhere classified: Secondary | ICD-10-CM | POA: Diagnosis not present

## 2018-05-19 NOTE — Therapy (Signed)
[facility] [address]  [place], [place], [address] Phone: [phone]   Fax:  [phone]  Physical Therapy Treatment  Patient Details  Name: Mindy [name] MRN: [id number] Date of Birth: 04-Mar-1961 Referring Provider: Karlton Lemon, MD   Encounter Date: 05/19/2018  PT End of Session - 05/19/18 1623    Visit Number  3    Number of Visits  12    Date for PT Re-Evaluation  06/23/18    Authorization Type  [facility]    PT Start Time  1620    PT Stop Time  1702    PT Time Calculation (min)  42 min    Activity Tolerance  Patient tolerated treatment well    Behavior During Therapy  [facility] for tasks assessed/performed       Past Medical History:  Diagnosis Date  . Allergic rhinitis   . Complication of anesthesia   . Elevated transaminase level 12.2014   ?etiology - present 09/2013 hosp for epigastric pain  . Family history of adverse reaction to anesthesia    father - PONV  . GERD (gastroesophageal reflux disease)   . History of bronchitis 10/2014  . Migraine   . PONV (postoperative nausea and vomiting)   . Seasonal allergies     Past Surgical History:  Procedure Laterality Date  . BREAST BIOPSY Right   . BREAST EXCISIONAL BIOPSY    . BREAST LUMPECTOMY WITH RADIOACTIVE SEED LOCALIZATION Right 10/08/2016   Procedure: RIGHT BREAST LUMPECTOMY WITH RADIOACTIVE SEED LOCALIZATION;  Surgeon: Coralie Keens, MD;  Location: [place];  Service: General;  Laterality: Right;  . CHOLECYSTECTOMY N/A 09/21/2015   Procedure: LAPAROSCOPIC CHOLECYSTECTOMY;  Surgeon: Coralie Keens, MD;  Location: [place];  Service: General;  Laterality: N/A;  . COLONOSCOPY  12/2013  . NECK SURGERY  2009  . PARTIAL HYSTERECTOMY  2007  . UPPER GI ENDOSCOPY  2016    There were no vitals filed for this visit.  Subjective Assessment - 05/19/18 1626    Subjective  Pt. noting good benefit from taping to R hip and LE.      How long can you sit  comfortably?  Tolerance gets worse as the day progresses; at end of the day just sitting at all is painful    Patient Stated Goals  to decrease pain and return to active lifestyle    Currently in Pain?  Yes    Pain Score  2     Pain Location  Hip    Pain Orientation  Right    Pain Descriptors / Indicators  Sore    Pain Radiating Towards  into the R groin area     Pain Onset  More than a month ago    Pain Frequency  Intermittent    Multiple Pain Sites  No                       [facility] Adult PT Treatment/Exercise - 05/19/18 1644      Knee/Hip Exercises: Aerobic   Nustep  L2 x 6 min (B UE/LE)      Knee/Hip Exercises: Supine   Bridges with Clamshell  -- x 12 reps; with hip abd/ER with red TB at knees isometric     Other Supine Knee/Hip Exercises  Hooklying alternating hip abd/ER with red TB at kinees x 10 reps each way      Manual Therapy   Manual Therapy  Soft tissue mobilization;Myofascial release;Passive  ROM;Taping    Manual therapy comments  hooklying and sidelying    Soft tissue mobilization  STM to R glute, ITB, TFL; ttp in R glute     Myofascial Release  TPR to R glute; good relief noted     Passive ROM  Manual R HS, glute, piri, ITB stretch with therapist x 30 sec each      Kinesiotix   Inhibit Muscle   30% R ITB & glutes               PT Short Term Goals - 05/14/18 0848      PT SHORT TERM GOAL #1   Title  Pt will be independent with initial HEP program     Status  On-going      PT SHORT TERM GOAL #2   Title  Pt will report pain at night no greater than 4/10    Status  On-going        PT Long Term Goals - 05/14/18 0848      PT LONG TERM GOAL #1   Title  Pt will be independent with advanced HEP program    Status  On-going      PT LONG TERM GOAL #2   Title  Pt will demonstrate good squat mechanics with no increase in pain to improve tolerance to functional activities     Status  On-going      PT LONG TERM GOAL #3   Title  Pt will  report ability to walk 5k distance 3x/week to improve ability to perform recreational activities    Status  On-going      PT LONG TERM GOAL #4   Title  Pt will report an improvement in pain/symptoms to > or = to 50% to demonstrate improved functional status and ability to participate in ADLs    Status  On-going      PT LONG TERM GOAL #5   Title  Pt will report sitting tolerance of >1 hour to improve ability to perform work-related activities    Status  On-going            Plan - 05/19/18 1624    Clinical Impression Statement  Mindy [name] reporting good relief from taping.  Reapplied taping to end session.  Much improved tolerance for STM/TPR today, which was utilized in area of tenderness in glute/piriformis and lateral thigh musculature to reduce tone and pain.  Gentle lumbopelvic therex today with good tolerance.  Will continue to progress toward goals.      PT Treatment/Interventions  ADLs/Self Care Home Management;Cryotherapy;Electrical Stimulation;Iontophoresis 4mg /ml Dexamethasone;Moist Heat;Ultrasound;Stair training;Functional mobility training;Therapeutic activities;Therapeutic exercise;Neuromuscular re-education;Patient/family education;Manual techniques;Passive range of motion;Dry needling;Taping    Consulted and Agree with Plan of Care  Patient       Patient will benefit from skilled therapeutic intervention in order to improve the following deficits and impairments:  Decreased activity tolerance, Pain, Decreased mobility, Increased muscle spasms, Decreased range of motion, Improper body mechanics, Impaired flexibility, Difficulty walking  Visit Diagnosis: Pain in left hip  Cramp and spasm  Difficulty in walking, not elsewhere classified  Stiffness of left hip, not elsewhere classified     Problem List Patient Active Problem List   Diagnosis Date Noted  . Right hip pain 05/07/2018  . Sleep difficulties 06/06/2017  . Constipation 06/06/2017  . Insomnia 04/03/2017  .  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 Low back pain 02/13/2017  . Right sided sciatica 02/13/2017  . RLQ abdominal  pain 08/20/2016  . Anal itching 08/20/2016  . GERD (gastroesophageal reflux disease) 06/05/2016  . Migraine 12/27/2015  . Headache 12/27/2015  . Lateral epicondylitis of left elbow 06/27/2015  . Cervical radiculitis 06/27/2015  . Non-allergic vasomotor rhinitis 06/01/2012    Bess Harvest, PTA 05/19/18 6:13 PM   [facility] [address]  [place], [place], [address] Phone: [phone]   Fax:  [phone]  Name: ANTANASIA KACZYNSKI MRN: [id number] Date of Birth: 13-Jan-1961

## 2018-05-21 MED FILL — ELETRIPTAN HYDROBROMIDE 40: 40 | 30 days supply | Qty: 6 | Fill #3

## 2018-05-26 ENCOUNTER — Ambulatory Visit: Payer: 59 | Attending: Family Medicine | Admitting: Physical Therapy

## 2018-05-26 ENCOUNTER — Encounter: Payer: Self-pay | Admitting: Physical Therapy

## 2018-05-26 DIAGNOSIS — M25652 Stiffness of left hip, not elsewhere classified: Secondary | ICD-10-CM | POA: Insufficient documentation

## 2018-05-26 DIAGNOSIS — M25552 Pain in left hip: Secondary | ICD-10-CM | POA: Diagnosis not present

## 2018-05-26 DIAGNOSIS — R262 Difficulty in walking, not elsewhere classified: Secondary | ICD-10-CM | POA: Insufficient documentation

## 2018-05-26 DIAGNOSIS — R252 Cramp and spasm: Secondary | ICD-10-CM | POA: Insufficient documentation

## 2018-05-26 NOTE — Therapy (Signed)
[facility] [address]  [place], [place], [address] Phone: [phone]   Fax:  [phone]  Physical Therapy Treatment  Patient Details  Name: Mindy [name] MRN: [id number] Date of Birth: Apr 18, 1961 Referring Provider: Karlton Lemon, MD   Encounter Date: 05/26/2018  PT End of Session - 05/26/18 1632    Visit Number  4    Number of Visits  12    Date for PT Re-Evaluation  06/23/18    Authorization Type  [facility]    PT Start Time  1616    PT Stop Time  1655    PT Time Calculation (min)  39 min    Activity Tolerance  Patient tolerated treatment well    Behavior During Therapy  [facility] for tasks assessed/performed       Past Medical History:  Diagnosis Date  . Allergic rhinitis   . Complication of anesthesia   . Elevated transaminase level 12.2014   ?etiology - present 09/2013 hosp for epigastric pain  . Family history of adverse reaction to anesthesia    father - PONV  . GERD (gastroesophageal reflux disease)   . History of bronchitis 10/2014  . Migraine   . PONV (postoperative nausea and vomiting)   . Seasonal allergies     Past Surgical History:  Procedure Laterality Date  . BREAST BIOPSY Right   . BREAST EXCISIONAL BIOPSY    . BREAST LUMPECTOMY WITH RADIOACTIVE SEED LOCALIZATION Right 10/08/2016   Procedure: RIGHT BREAST LUMPECTOMY WITH RADIOACTIVE SEED LOCALIZATION;  Surgeon: Coralie Keens, MD;  Location: [place];  Service: General;  Laterality: Right;  . CHOLECYSTECTOMY N/A 09/21/2015   Procedure: LAPAROSCOPIC CHOLECYSTECTOMY;  Surgeon: Coralie Keens, MD;  Location: [place];  Service: General;  Laterality: N/A;  . COLONOSCOPY  12/2013  . NECK SURGERY  2009  . PARTIAL HYSTERECTOMY  2007  . UPPER GI ENDOSCOPY  2016    There were no vitals filed for this visit.  Subjective Assessment - 05/26/18 1619    Subjective  Pt continuing to note good relief from therapy sessions. She states that taping pattern  that was performed 2 visits ago provided her with very good pain relief and decreased the amount of pain medicaiton that she was taking.     Limitations  Walking;Sitting;Standing    Patient Stated Goals  to decrease pain and return to active lifestyle    Currently in Pain?  Yes    Pain Score  3  1 before beginning warm-up    Pain Location  Hip    Pain Orientation  Right    Pain Descriptors / Indicators  Aching    Multiple Pain Sites  No                       [facility] Adult PT Treatment/Exercise - 05/26/18 0001      Lumbar Exercises: Supine   Glut Set  10 reps;5 seconds    Glut Set Limitations  Isometric with bil heels on peanut ball     Straight Leg Raise  10 reps;Limitations    Straight Leg Raises Limitations  With R LE      Knee/Hip Exercises: Aerobic   Nustep  L4 x 6 min (LE only)      Knee/Hip Exercises: Standing   Forward Step Up  Both;1 set;10 reps    Functional Squat  10 reps    Functional Squat Limitations  With 2 UE support at counter  SLS with Vectors  Standing on therapad with R LE; reaching for 3 dots placed on ground to the L for 5 rounds    Other Standing Knee Exercises  Standing extension against fitter with 2 blue cords; stopped after 4 reps secondary to increased pain    Other Standing Knee Exercises  Lateral walking; 10 steps in each direction. VC to decrease step length to prevent pain       Knee/Hip Exercises: Seated   Sit to Sand  10 reps;without UE support      Knee/Hip Exercises: Supine   Bridges  Both;10 reps;Limitations    Bridges Limitations  With bil heels on peanut ball; through ~1/2 full ROM or to pt tolerance      Manual Therapy   Manual Therapy  Taping    Kinesiotex  Inhibit Muscle      Kinesiotix   Inhibit Muscle   30% R ITB, Glutes, and from greater trochanter to ASIS               PT Short Term Goals - 05/14/18 0848      PT SHORT TERM GOAL #1   Title  Pt will be independent with initial HEP program     Status   On-going      PT SHORT TERM GOAL #2   Title  Pt will report pain at night no greater than 4/10    Status  On-going        PT Long Term Goals - 05/14/18 0848      PT LONG TERM GOAL #1   Title  Pt will be independent with advanced HEP program    Status  On-going      PT LONG TERM GOAL #2   Title  Pt will demonstrate good squat mechanics with no increase in pain to improve tolerance to functional activities     Status  On-going      PT LONG TERM GOAL #3   Title  Pt will report ability to walk 5k distance 3x/week to improve ability to perform recreational activities    Status  On-going      PT LONG TERM GOAL #4   Title  Pt will report an improvement in pain/symptoms to > or = to 50% to demonstrate improved functional status and ability to participate in ADLs    Status  On-going      PT LONG TERM GOAL #5   Title  Pt will report sitting tolerance of >1 hour to improve ability to perform work-related activities    Status  On-going            Plan - 05/26/18 1658    Clinical Impression Statement  Haylo is making good progress through this episode of therapy with decreased pain levels with every visit.  She continues to have a good response to manual therapy and taping, with less TTP with every visit. Her functional mobility has improved greatly and she reports that she navigated 4 flights of stairs today with only a mild increase in pain. During therapeutic activities performed during today's session pt noted mild increase in pain with activities involving lengthening the hip flexors including standing hip extension, as well as activities involving abduction to a certain degree. At this time pt will continue to benefit from physical therapy to address these deficits and continue to improve functional mobility.     Rehab Potential  Good    PT Treatment/Interventions  ADLs/Self Care Home Management;Cryotherapy;Electrical Stimulation;Iontophoresis 4mg /ml Dexamethasone;Moist  Heat;Ultrasound;Stair training;Functional mobility training;Therapeutic activities;Therapeutic exercise;Neuromuscular re-education;Patient/family education;Manual techniques;Passive range of motion;Dry needling;Taping    Consulted and Agree with Plan of Care  Patient       Patient will benefit from skilled therapeutic intervention in order to improve the following deficits and impairments:  Decreased activity tolerance, Pain, Decreased mobility, Increased muscle spasms, Decreased range of motion, Improper body mechanics, Impaired flexibility, Difficulty walking  Visit Diagnosis: Pain in left hip  Cramp and spasm  Difficulty in walking, not elsewhere classified  Stiffness of left hip, not elsewhere classified     Problem List Patient Active Problem List   Diagnosis Date Noted  . Right hip pain 05/07/2018  . Sleep difficulties 06/06/2017  . Constipation 06/06/2017  . Insomnia 04/03/2017  . 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 Low back pain 02/13/2017  . Right sided sciatica 02/13/2017  . RLQ abdominal pain 08/20/2016  . Anal itching 08/20/2016  . GERD (gastroesophageal reflux disease) 06/05/2016  . Migraine 12/27/2015  . Headache 12/27/2015  . Lateral epicondylitis of left elbow 06/27/2015  . Cervical radiculitis 06/27/2015  . Non-allergic vasomotor rhinitis 06/01/2012    Shirline Frees, SPT 05/26/2018, 6:08 PM  [facility] [address]  [place], [place], [address] Phone: [phone]   Fax:  [phone]  Name: MYONNA CHISOM MRN: [id number] Date of Birth: [birth year]

## 2018-05-28 ENCOUNTER — Ambulatory Visit: Payer: 59

## 2018-05-28 DIAGNOSIS — R262 Difficulty in walking, not elsewhere classified: Secondary | ICD-10-CM

## 2018-05-28 DIAGNOSIS — M25552 Pain in left hip: Secondary | ICD-10-CM

## 2018-05-28 DIAGNOSIS — R252 Cramp and spasm: Secondary | ICD-10-CM | POA: Diagnosis not present

## 2018-05-28 DIAGNOSIS — M25652 Stiffness of left hip, not elsewhere classified: Secondary | ICD-10-CM | POA: Diagnosis not present

## 2018-05-28 NOTE — Therapy (Signed)
[facility] [address]  [place], [place], [address] Phone: [phone]   Fax:  [phone]  Physical Therapy Treatment  Patient Details  Name: Mindy [name] MRN: [id number] Date of Birth: [birth year] Referring Provider: Karlton Lemon, MD   Encounter Date: 05/28/2018  PT End of Session - 05/28/18 1620    Visit Number  5    Number of Visits  12    Date for PT Re-Evaluation  06/23/18    Authorization Type  [facility]    PT Start Time  1616    PT Stop Time  1659    PT Time Calculation (min)  43 min    Activity Tolerance  Patient tolerated treatment well    Behavior During Therapy  [facility] for tasks assessed/performed       Past Medical History:  Diagnosis Date  . Allergic rhinitis   . Complication of anesthesia   . Elevated transaminase level 12.2014   ?etiology - present 09/2013 hosp for epigastric pain  . Family history of adverse reaction to anesthesia    father - PONV  . GERD (gastroesophageal reflux disease)   . History of bronchitis 10/2014  . Migraine   . PONV (postoperative nausea and vomiting)   . Seasonal allergies     Past Surgical History:  Procedure Laterality Date  . BREAST BIOPSY Right   . BREAST EXCISIONAL BIOPSY    . BREAST LUMPECTOMY WITH RADIOACTIVE SEED LOCALIZATION Right 10/08/2016   Procedure: RIGHT BREAST LUMPECTOMY WITH RADIOACTIVE SEED LOCALIZATION;  Surgeon: Coralie Keens, MD;  Location: [place];  Service: General;  Laterality: Right;  . CHOLECYSTECTOMY N/A 09/21/2015   Procedure: LAPAROSCOPIC CHOLECYSTECTOMY;  Surgeon: Coralie Keens, MD;  Location: [place];  Service: General;  Laterality: N/A;  . COLONOSCOPY  12/2013  . NECK SURGERY  2009  . PARTIAL HYSTERECTOMY  2007  . UPPER GI ENDOSCOPY  2016    There were no vitals filed for this visit.  Subjective Assessment - 05/28/18 1618    Subjective  Pt. noting some increased R knee pain over this past week due to having to work on recent  [place] and increased sitting.      How long can you sit comfortably?  Tolerance gets worse as the day progresses; at end of the day just sitting at all is painful    Patient Stated Goals  to decrease pain and return to active lifestyle    Currently in Pain?  Yes    Pain Score  2     Pain Location  Hip    Pain Orientation  Right    Pain Descriptors / Indicators  Aching    Pain Type  Acute pain    Pain Radiating Towards  none     Pain Onset  More than a month ago    Aggravating Factors   prolonged sitting     Multiple Pain Sites  No                       [facility] Adult PT Treatment/Exercise - 05/28/18 1627      Knee/Hip Exercises: Aerobic   Nustep  L4 x 6 min (LE only)      Knee/Hip Exercises: Standing   Hip Extension  Right;Left;Stengthening;10 reps;Knee straight    Extension Limitations  leaning peanut p-ball     Lateral Step Up  Right;10 reps;Step Height: 8";Hand Hold: 2    Functional Squat  10  reps;3 seconds    Functional Squat Limitations  TRX - mini squat     Other Standing Knee Exercises  Side stepping with yellow TB at ankles 6 x 15 ft at counter       Knee/Hip Exercises: Supine   Bridges with Clamshell  Both;Strengthening;15 reps   5" hold: with hip abd/ER isoemtrics into red TB at knees    Other Supine Knee/Hip Exercises  Straight leg bridge with heels on peanut p-ball x 10      Knee/Hip Exercises: Prone   Other Prone Exercises  Alternating "fire hydrants" x 10 reps                PT Short Term Goals - 05/28/18 1623      PT SHORT TERM GOAL #1   Title  Pt will be independent with initial HEP program     Status  Achieved      PT SHORT TERM GOAL #2   Title  Pt will report pain at night no greater than 4/10    Status  Achieved        PT Long Term Goals - 05/14/18 0848      PT LONG TERM GOAL #1   Title  Pt will be independent with advanced HEP program    Status  On-going      PT LONG TERM GOAL #2   Title  Pt will  demonstrate good squat mechanics with no increase in pain to improve tolerance to functional activities     Status  On-going      PT LONG TERM GOAL #3   Title  Pt will report ability to walk 5k distance 3x/week to improve ability to perform recreational activities    Status  On-going      PT LONG TERM GOAL #4   Title  Pt will report an improvement in pain/symptoms to > or = to 50% to demonstrate improved functional status and ability to participate in ADLs    Status  On-going      PT LONG TERM GOAL #5   Title  Pt will report sitting tolerance of >1 hour to improve ability to perform work-related activities    Status  On-going            Plan - 05/28/18 1630    Clinical Impression Statement  Pt. doing well today noting good pain relief from taping at recent visit.  Tolerated mild progression in proximal hip strengthening activities well today.  Did have short-lasting R hip pain with squat to 90 dg knee flexion today however relief following slight decrease in intensity of this activity.  Pt. noting less pain with navigating stairs and walking at this point and feels she is improving with therapy.  Notes she is not longer having R hip pain at night.  Progressing well.      PT Treatment/Interventions  ADLs/Self Care Home Management;Cryotherapy;Electrical Stimulation;Iontophoresis 4mg /ml Dexamethasone;Moist Heat;Ultrasound;Stair training;Functional mobility training;Therapeutic activities;Therapeutic exercise;Neuromuscular re-education;Patient/family education;Manual techniques;Passive range of motion;Dry needling;Taping    Consulted and Agree with Plan of Care  Patient       Patient will benefit from skilled therapeutic intervention in order to improve the following deficits and impairments:  Decreased activity tolerance, Pain, Decreased mobility, Increased muscle spasms, Decreased range of motion, Improper body mechanics, Impaired flexibility, Difficulty walking  Visit Diagnosis: Pain  in left hip  Cramp and spasm  Difficulty in walking, not elsewhere classified  Stiffness of left hip, not elsewhere classified  Problem List Patient Active Problem List   Diagnosis Date Noted  . Right hip pain 05/07/2018  . Sleep difficulties 06/06/2017  . Constipation 06/06/2017  . Insomnia 04/03/2017  . 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 Low back pain 02/13/2017  . Right sided sciatica 02/13/2017  . RLQ abdominal pain 08/20/2016  . Anal itching 08/20/2016  . GERD (gastroesophageal reflux disease) 06/05/2016  . Migraine 12/27/2015  . Headache 12/27/2015  . Lateral epicondylitis of left elbow 06/27/2015  . Cervical radiculitis 06/27/2015  . Non-allergic vasomotor rhinitis 06/01/2012    Bess Harvest, PTA 05/28/18 6:27 PM    [facility] [address]  [place], [place], [address] Phone: [phone]   Fax:  [phone]  Name: Mindy [name] MRN: [id number] Date of Birth: 08-28-1961

## 2018-06-01 ENCOUNTER — Encounter: Payer: 59 | Admitting: Physical Therapy

## 2018-06-03 ENCOUNTER — Ambulatory Visit: Payer: 59

## 2018-06-03 DIAGNOSIS — M25552 Pain in left hip: Secondary | ICD-10-CM

## 2018-06-03 DIAGNOSIS — M25652 Stiffness of left hip, not elsewhere classified: Secondary | ICD-10-CM

## 2018-06-03 DIAGNOSIS — R262 Difficulty in walking, not elsewhere classified: Secondary | ICD-10-CM | POA: Diagnosis not present

## 2018-06-03 DIAGNOSIS — R252 Cramp and spasm: Secondary | ICD-10-CM

## 2018-06-03 NOTE — Therapy (Signed)
[facility] [address]  [place], [place], [address] Phone: [phone]   Fax:  [phone]  Physical Therapy Treatment  Patient Details  Name: Mindy [name] MRN: [id number] Date of Birth: 26-Dec-1960 Referring Provider: Karlton Lemon, MD   Encounter Date: 06/03/2018  PT End of Session - 06/03/18 0803    Visit Number  6    Number of Visits  12    Date for PT Re-Evaluation  06/23/18    Authorization Type  [facility]    PT Start Time  0800    PT Stop Time  0846    PT Time Calculation (min)  46 min    Activity Tolerance  Patient tolerated treatment well    Behavior During Therapy  [facility] for tasks assessed/performed       Past Medical History:  Diagnosis Date  . Allergic rhinitis   . Complication of anesthesia   . Elevated transaminase level 12.2014   ?etiology - present 09/2013 hosp for epigastric pain  . Family history of adverse reaction to anesthesia    father - PONV  . GERD (gastroesophageal reflux disease)   . History of bronchitis 10/2014  . Migraine   . PONV (postoperative nausea and vomiting)   . Seasonal allergies     Past Surgical History:  Procedure Laterality Date  . BREAST BIOPSY Right   . BREAST EXCISIONAL BIOPSY    . BREAST LUMPECTOMY WITH RADIOACTIVE SEED LOCALIZATION Right 10/08/2016   Procedure: RIGHT BREAST LUMPECTOMY WITH RADIOACTIVE SEED LOCALIZATION;  Surgeon: Coralie Keens, MD;  Location: [place];  Service: General;  Laterality: Right;  . CHOLECYSTECTOMY N/A 09/21/2015   Procedure: LAPAROSCOPIC CHOLECYSTECTOMY;  Surgeon: Coralie Keens, MD;  Location: [place];  Service: General;  Laterality: N/A;  . COLONOSCOPY  12/2013  . NECK SURGERY  2009  . PARTIAL HYSTERECTOMY  2007  . UPPER GI ENDOSCOPY  2016    There were no vitals filed for this visit.  Subjective Assessment - 06/03/18 0801    Subjective  Pt. doing well todya however notes somewhat increased R hip pain which she attributes to  not having tape intact.  Notes she has weaned off Aleve this past week however noted that she woke with pain x 1 last night with 5/10 pain and attributes this to not having taping on.      How long can you sit comfortably?  Able to sit 45 min without pain now then mild onset of pain     Patient Stated Goals  to decrease pain and return to active lifestyle    Currently in Pain?  Yes    Pain Score  2     Pain Location  Hip    Pain Orientation  Right    Pain Descriptors / Indicators  Aching    Pain Type  Acute pain    Aggravating Factors   Pt. notes abduction, ER     Multiple Pain Sites  No                       [facility] Adult PT Treatment/Exercise - 06/03/18 0816      Self-Care   Self-Care  Other Self-Care Comments    Other Self-Care Comments   glute self-ball release on wall       Knee/Hip Exercises: Aerobic   Recumbent Bike  Lvl 2, 6 min       Knee/Hip Exercises: Standing   Lateral Step Up  Right;Step Height: 8";Hand Hold: 1   x 12 reps    Lateral Step Up Limitations  1 ski pole    Forward Step Up  Right;Left;Hand Hold: 0;Step Height: 8"   x 12 reps    Functional Squat  15 reps;3 seconds   0-90 dg without pain    Functional Squat Limitations  at TM rail    Wall Squat  15 reps;5 seconds    Wall Squat Limitations  ~ 80 dg flexion       Manual Therapy   Manual Therapy  Taping    Manual therapy comments  sidelying with R LE elevated on bolster     Soft tissue mobilization  STM to R glute, TFL, ITB; ttp in R glute/piri    Myofascial Release  TPR to R piri    Kinesiotex  Inhibit Muscle      Kinesiotix   Inhibit Muscle   30% R ITB, Glutes, and from greater trochanter to ASIS               PT Short Term Goals - 05/28/18 1623      PT SHORT TERM GOAL #1   Title  Pt will be independent with initial HEP program     Status  Achieved      PT SHORT TERM GOAL #2   Title  Pt will report pain at night no greater than 4/10    Status  Achieved        PT  Long Term Goals - 05/14/18 0848      PT LONG TERM GOAL #1   Title  Pt will be independent with advanced HEP program    Status  On-going      PT LONG TERM GOAL #2   Title  Pt will demonstrate good squat mechanics with no increase in pain to improve tolerance to functional activities     Status  On-going      PT LONG TERM GOAL #3   Title  Pt will report ability to walk 5k distance 3x/week to improve ability to perform recreational activities    Status  On-going      PT LONG TERM GOAL #4   Title  Pt will report an improvement in pain/symptoms to > or = to 50% to demonstrate improved functional status and ability to participate in ADLs    Status  On-going      PT LONG TERM GOAL #5   Title  Pt will report sitting tolerance of >1 hour to improve ability to perform work-related activities    Status  On-going            Plan - 06/03/18 0804    Clinical Impression Statement  Mindy [name] noting tape removed two days ago and has noticed increased pain since this.  Now feels she can sit for 45 min comfortably.  Session focusing on progression of proximal hip strengthening activities with addition of wall sit and leg press and pt. tolerating well.  Pt. requested reapplication of taping to end session today thus re-taped pt. R hip/lateral thigh in same pattern.  Pt. did have "popping" sensation in anterior hip x 1 today following warmup today with some pain, which quickly resolved.  Will continue to progress toward goals.        PT Treatment/Interventions  ADLs/Self Care Home Management;Cryotherapy;Electrical Stimulation;Iontophoresis 4mg /ml Dexamethasone;Moist Heat;Ultrasound;Stair training;Functional mobility training;Therapeutic activities;Therapeutic exercise;Neuromuscular re-education;Patient/family education;Manual techniques;Passive range of motion;Dry needling;Taping    Consulted and Agree with Plan of Care  Patient       Patient will benefit from skilled therapeutic intervention in order to  improve the following deficits and impairments:  Decreased activity tolerance, Pain, Decreased mobility, Increased muscle spasms, Decreased range of motion, Improper body mechanics, Impaired flexibility, Difficulty walking  Visit Diagnosis: Pain in left hip  Cramp and spasm  Difficulty in walking, not elsewhere classified  Stiffness of left hip, not elsewhere classified     Problem List Patient Active Problem List   Diagnosis Date Noted  . Right hip pain 05/07/2018  . Sleep difficulties 06/06/2017  . Constipation 06/06/2017  . Insomnia 04/03/2017  . 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 Low back pain 02/13/2017  . Right sided sciatica 02/13/2017  . RLQ abdominal pain 08/20/2016  . Anal itching 08/20/2016  . GERD (gastroesophageal reflux disease) 06/05/2016  . Migraine 12/27/2015  . Headache 12/27/2015  . Lateral epicondylitis of left elbow 06/27/2015  . Cervical radiculitis 06/27/2015  . Non-allergic vasomotor rhinitis 06/01/2012    Bess Harvest, PTA 06/03/18 10:04 AM   [facility] [address]  [place], [place], [address] Phone: [phone]   Fax:  [phone]  Name: Mindy [name] MRN: [id number] Date of Birth: Sep 13, 1961

## 2018-06-04 ENCOUNTER — Ambulatory Visit: Payer: 59

## 2018-06-08 ENCOUNTER — Encounter: Payer: Self-pay | Admitting: Physical Therapy

## 2018-06-08 ENCOUNTER — Ambulatory Visit: Payer: 59 | Admitting: Physical Therapy

## 2018-06-08 DIAGNOSIS — M25652 Stiffness of left hip, not elsewhere classified: Secondary | ICD-10-CM | POA: Diagnosis not present

## 2018-06-08 DIAGNOSIS — R252 Cramp and spasm: Secondary | ICD-10-CM | POA: Diagnosis not present

## 2018-06-08 DIAGNOSIS — M25552 Pain in left hip: Secondary | ICD-10-CM | POA: Diagnosis not present

## 2018-06-08 DIAGNOSIS — R262 Difficulty in walking, not elsewhere classified: Secondary | ICD-10-CM

## 2018-06-08 NOTE — Therapy (Signed)
[facility] [address]  [place], [place], [address] Phone: [phone]   Fax:  [phone]  Physical Therapy Treatment  Patient Details  Name: ANASTASIA TOMPSON MRN: [id number] Date of Birth: [birth year] Referring Provider: Karlton Lemon, MD   Encounter Date: 06/08/2018  PT End of Session - 06/08/18 1712    Visit Number  7    Number of Visits  12    Date for PT Re-Evaluation  06/23/18    Authorization Type  [facility]    PT Start Time  1712   Pt arrived late, then changing clothes   PT Stop Time  1750    PT Time Calculation (min)  38 min    Activity Tolerance  Patient tolerated treatment well    Behavior During Therapy  [facility] for tasks assessed/performed       Past Medical History:  Diagnosis Date  . Allergic rhinitis   . Complication of anesthesia   . Elevated transaminase level 12.2014   ?etiology - present 09/2013 hosp for epigastric pain  . Family history of adverse reaction to anesthesia    father - PONV  . GERD (gastroesophageal reflux disease)   . History of bronchitis 10/2014  . Migraine   . PONV (postoperative nausea and vomiting)   . Seasonal allergies     Past Surgical History:  Procedure Laterality Date  . BREAST BIOPSY Right   . BREAST EXCISIONAL BIOPSY    . BREAST LUMPECTOMY WITH RADIOACTIVE SEED LOCALIZATION Right 10/08/2016   Procedure: RIGHT BREAST LUMPECTOMY WITH RADIOACTIVE SEED LOCALIZATION;  Surgeon: Coralie Keens, MD;  Location: [place];  Service: General;  Laterality: Right;  . CHOLECYSTECTOMY N/A 09/21/2015   Procedure: LAPAROSCOPIC CHOLECYSTECTOMY;  Surgeon: Coralie Keens, MD;  Location: [place];  Service: General;  Laterality: N/A;  . COLONOSCOPY  12/2013  . NECK SURGERY  2009  . PARTIAL HYSTERECTOMY  2007  . UPPER GI ENDOSCOPY  2016    There were no vitals filed for this visit.  Subjective Assessment - 06/08/18 1715    Subjective  Pt reports pain is improving. Was able to go  for a hike on Sat while the tape was still in place, although tape was "sweated off" by the end. Notes pain remained better controlled even w/o the tape which is better than the last time the tape came off.    Patient Stated Goals  to decrease pain and return to active lifestyle    Currently in Pain?  No/denies    Pain Score  0-No pain                       [facility] Adult PT Treatment/Exercise - 06/08/18 1712      Exercises   Exercises  Knee/Hip      Knee/Hip Exercises: Aerobic   Nustep  L5 x 6 min (LE only)      Knee/Hip Exercises: Standing   Forward Lunges  Right;Left;10 reps;2 seconds    Forward Lunges Limitations  single UE support on counter    Hip Extension  Right;Left;15 reps;Stengthening    Extension Limitations  Fitter - 2 blue bands, 1 pole A    Step Down  Right;5 reps;Step Height: 6";Hand Hold: 2    Step Down Limitations  eccentric lowering with heel touch - stopped d/t increased R hip pain    Functional Squat  15 reps;3 seconds    Functional Squat Limitations  triple extension on rise to  stand; UE suport on TM rail    Other Standing Knee Exercises  B side stepping with yellow TB at ankles x 25 ft; Fwd/back monster walk with yellow TB at ankles x 25 ft each      Knee/Hip Exercises: Supine   Bridges with Clamshell  Both;5 reps;Strengthening   + isometric hip ABD with red TB   Bridges with Clamshell  Both;10 reps;Strengthening   + alt hip ABD/ER with red TB     Knee/Hip Exercises: Sidelying   Clams  R clam in L sidelying with red TB x15             PT Education - 06/08/18 1759    Education Details  HEP update - red TB for bridges; side-stepping & monster walk with yellow TB    Person(s) Educated  Patient    Methods  Explanation;Demonstration;Handout    Comprehension  Verbalized understanding;Returned demonstration       PT Short Term Goals - 05/28/18 1623      PT SHORT TERM GOAL #1   Title  Pt will be independent with initial HEP program      Status  Achieved      PT SHORT TERM GOAL #2   Title  Pt will report pain at night no greater than 4/10    Status  Achieved        PT Long Term Goals - 05/14/18 0848      PT LONG TERM GOAL #1   Title  Pt will be independent with advanced HEP program    Status  On-going      PT LONG TERM GOAL #2   Title  Pt will demonstrate good squat mechanics with no increase in pain to improve tolerance to functional activities     Status  On-going      PT LONG TERM GOAL #3   Title  Pt will report ability to walk 5k distance 3x/week to improve ability to perform recreational activities    Status  On-going      PT LONG TERM GOAL #4   Title  Pt will report an improvement in pain/symptoms to > or = to 50% to demonstrate improved functional status and ability to participate in ADLs    Status  On-going      PT LONG TERM GOAL #5   Title  Pt will report sitting tolerance of >1 hour to improve ability to perform work-related activities    Status  On-going            Plan - 06/08/18 1718    Clinical Impression Statement  Carin reporting increased activity tolerance with decreasing pain - able to go hiking over the weekend w/o increase in pain, with pain remaining well controlled even after kinesiotape removed. Good tolerance for exercise progression noting mostly fatigue/weakness, with pain only limiting eccentric step-down - pt also noted to have limited ability to maintain level pelvis, therefore discontinued this exercise. Pt reporting some self-progression of HEP (progressed hooklying glute sets to bridges), therefore porvided red TB for bridges and HEP updated to include yellow theraband resisted stepping. Pt opting to defer taping today to see how she will do w/o kinesiotape. Pt will continue to benefit from skilled PT to for continued strengthening progression to improve hip stability and activity tolerance.    Rehab Potential  Good    PT Treatment/Interventions  ADLs/Self Care Home  Management;Cryotherapy;Electrical Stimulation;Iontophoresis 4mg /ml Dexamethasone;Moist Heat;Ultrasound;Stair training;Functional mobility training;Therapeutic activities;Therapeutic exercise;Neuromuscular re-education;Patient/family education;Manual techniques;Passive range of motion;Dry  needling;Taping    Consulted and Agree with Plan of Care  Patient       Patient will benefit from skilled therapeutic intervention in order to improve the following deficits and impairments:  Decreased activity tolerance, Pain, Decreased mobility, Increased muscle spasms, Decreased range of motion, Improper body mechanics, Impaired flexibility, Difficulty walking  Visit Diagnosis: Pain in left hip  Cramp and spasm  Difficulty in walking, not elsewhere classified  Stiffness of left hip, not elsewhere classified     Problem List Patient Active Problem List   Diagnosis Date Noted  . Right hip pain 05/07/2018  . Sleep difficulties 06/06/2017  . Constipation 06/06/2017  . Insomnia 04/03/2017  . 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 Low back pain 02/13/2017  . Right sided sciatica 02/13/2017  . RLQ abdominal pain 08/20/2016  . Anal itching 08/20/2016  . GERD (gastroesophageal reflux disease) 06/05/2016  . Migraine 12/27/2015  . Headache 12/27/2015  . Lateral epicondylitis of left elbow 06/27/2015  . Cervical radiculitis 06/27/2015  . Non-allergic vasomotor rhinitis 06/01/2012    Percival Spanish, PT, MPT 06/08/2018, 6:02 PM  [facility] [address]  [place], [place], [address] Phone: [phone]   Fax:  [phone]  Name: TRINADEE VERHAGEN MRN: [id number] Date of Birth: 1961-10-01

## 2018-06-14 ENCOUNTER — Encounter: Payer: Self-pay | Admitting: Physical Therapy

## 2018-06-15 ENCOUNTER — Ambulatory Visit: Payer: 59 | Admitting: Physical Therapy

## 2018-06-18 ENCOUNTER — Ambulatory Visit: Payer: 59

## 2018-06-18 DIAGNOSIS — R262 Difficulty in walking, not elsewhere classified: Secondary | ICD-10-CM | POA: Diagnosis not present

## 2018-06-18 DIAGNOSIS — M25552 Pain in left hip: Secondary | ICD-10-CM

## 2018-06-18 DIAGNOSIS — M25652 Stiffness of left hip, not elsewhere classified: Secondary | ICD-10-CM | POA: Diagnosis not present

## 2018-06-18 DIAGNOSIS — R252 Cramp and spasm: Secondary | ICD-10-CM

## 2018-06-18 NOTE — Therapy (Addendum)
[facility] [address]  [place], [place], [address] Phone: [phone]   Fax:  [phone]  Physical Therapy Treatment  Patient Details  Name: Mindy [name] MRN: [id number] Date of Birth: [birth year] Referring Provider: Karlton Lemon, MD   Encounter Date: 06/18/2018  PT End of Session - 06/18/18 1625    Visit Number  8    Number of Visits  12    Date for PT Re-Evaluation  06/23/18    Authorization Type  [facility]    PT Start Time  1621    PT Stop Time  1701    PT Time Calculation (min)  40 min    Activity Tolerance  Patient tolerated treatment well    Behavior During Therapy  [facility] for tasks assessed/performed       Past Medical History:  Diagnosis Date  . Allergic rhinitis   . Complication of anesthesia   . Elevated transaminase level 12.2014   ?etiology - present 09/2013 hosp for epigastric pain  . Family history of adverse reaction to anesthesia    father - PONV  . GERD (gastroesophageal reflux disease)   . History of bronchitis 10/2014  . Migraine   . PONV (postoperative nausea and vomiting)   . Seasonal allergies     Past Surgical History:  Procedure Laterality Date  . BREAST BIOPSY Right   . BREAST EXCISIONAL BIOPSY    . BREAST LUMPECTOMY WITH RADIOACTIVE SEED LOCALIZATION Right 10/08/2016   Procedure: RIGHT BREAST LUMPECTOMY WITH RADIOACTIVE SEED LOCALIZATION;  Surgeon: Coralie Keens, MD;  Location: [place];  Service: General;  Laterality: Right;  . CHOLECYSTECTOMY N/A 09/21/2015   Procedure: LAPAROSCOPIC CHOLECYSTECTOMY;  Surgeon: Coralie Keens, MD;  Location: [place];  Service: General;  Laterality: N/A;  . COLONOSCOPY  12/2013  . NECK SURGERY  2009  . PARTIAL HYSTERECTOMY  2007  . UPPER GI ENDOSCOPY  2016    There were no vitals filed for this visit.  Subjective Assessment - 06/18/18 1624    Subjective  Notes she is doing much better over last two visits.      How long can you sit  comfortably?  Able to sit 45 min without pain now then mild onset of pain     Patient Stated Goals  to decrease pain and return to active lifestyle    Currently in Pain?  No/denies    Pain Score  0-No pain    Multiple Pain Sites  No         [facility] PT Assessment - 06/18/18 1650      Observation/Other Assessments   Focus on Therapeutic Outcomes (FOTO)   81% (19% improvement)      Strength   Overall Strength Comments  No signficant pain on MMT    Strength Assessment Site  Hip;Knee    Right/Left Hip  Right;Left    Right Hip Flexion  4+/5    Right Hip Extension  4/5    Right Hip External Rotation   4+/5    Right Hip Internal Rotation  4+/5    Right Hip ABduction  4+/5    Right Hip ADduction  4/5    Left Hip Flexion  4/5    Left Hip Extension  4+/5    Left Hip External Rotation  4+/5    Left Hip Internal Rotation  4+/5    Left Hip ABduction  4+/5    Left Hip ADduction  4+/5    Right/Left Knee  Right;Left    Right Knee Flexion  5/5    Right Knee Extension  5/5    Left Knee Flexion  5/5    Left Knee Extension  5/5                   [facility] Adult PT Treatment/Exercise - 06/18/18 1631      Knee/Hip Exercises: Stretches   Passive Hamstring Stretch  Right;Left;2 reps;20 seconds    Passive Hamstring Stretch Limitations  seated       Knee/Hip Exercises: Aerobic   Nustep  L5 x 7 min (LE only)      Knee/Hip Exercises: Standing   Forward Lunges  Right;Left;10 reps;2 seconds    Forward Lunges Limitations  TRX -     Functional Squat  15 reps;5 seconds    Functional Squat Limitations  TRX + heel raise at bottom of movement     Wall Squat  15 reps;3 seconds    Wall Squat Limitations  ~ 90 dg     Other Standing Knee Exercises  B side stepping with red TB at ankles 2 x 20 ft       Knee/Hip Exercises: Supine   Other Supine Knee/Hip Exercises  Bridge + HS curl with heels on peantu pball x 10 reps              PT Education - 06/18/18 1822    Education Details  HEP  update     Person(s) Educated  Patient    Methods  Explanation;Demonstration;Verbal cues;Handout    Comprehension  Verbalized understanding;Returned demonstration;Verbal cues required;Need further instruction       PT Short Term Goals - 05/28/18 1623      PT SHORT TERM GOAL #1   Title  Pt will be independent with initial HEP program     Status  Achieved      PT SHORT TERM GOAL #2   Title  Pt will report pain at night no greater than 4/10    Status  Achieved        PT Long Term Goals - 06/18/18 1647      PT LONG TERM GOAL #1   Title  Pt will be independent with advanced HEP program    Status  Partially Met      PT LONG TERM GOAL #2   Title  Pt will demonstrate good squat mechanics with no increase in pain to improve tolerance to functional activities     Status  Achieved      PT LONG TERM GOAL #3   Title  Pt will report ability to walk 5k distance 3x/week to improve ability to perform recreational activities    Status  Partially Met   Has been able to walk 1-2 mile distance without issue      PT LONG TERM GOAL #4   Title  Pt will report an improvement in pain/symptoms to > or = to 50% to demonstrate improved functional status and ability to participate in ADLs    Status  Achieved   Pt. noting ~ 80 % improvement in functional status and ability to participate in ADL's     PT LONG TERM GOAL #5   Title  Pt will report sitting tolerance of >1 hour to improve ability to perform work-related activities    Status  Achieved   Pt. noting she is able to sit > 1 hour with changing positions           Plan -  06/18/18 1628    Clinical Impression Statement  Mindy [name] doing well noting she may wish to hold off on further therapy until after upcoming f/u with MD.  Notes ~ 80% improvement in function and pain since last visit.  Able to demo good improvement in LE MMT and only remaining LE strength deficit in hip ext, adduction 4/5.  HEP updated to focus on remaining proximal hip  strength deficits.  Making good progress toward LTG's and now partially or fully achieved all LTG's.  Will plan to wait to hear back from pt. regarding further therapy.      PT Treatment/Interventions  ADLs/Self Care Home Management;Cryotherapy;Electrical Stimulation;Iontophoresis 38m/ml Dexamethasone;Moist Heat;Ultrasound;Stair training;Functional mobility training;Therapeutic activities;Therapeutic exercise;Neuromuscular re-education;Patient/family education;Manual techniques;Passive range of motion;Dry needling;Taping    Consulted and Agree with Plan of Care  Patient       Patient will benefit from skilled therapeutic intervention in order to improve the following deficits and impairments:  Decreased activity tolerance, Pain, Decreased mobility, Increased muscle spasms, Decreased range of motion, Improper body mechanics, Impaired flexibility, Difficulty walking  Visit Diagnosis: Pain in left hip  Cramp and spasm  Difficulty in walking, not elsewhere classified  Stiffness of left hip, not elsewhere classified     Problem List Patient Active Problem List   Diagnosis Date Noted  . Right hip pain 05/07/2018  . Sleep difficulties 06/06/2017  . Constipation 06/06/2017  . Insomnia 04/03/2017  . 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 Low back pain 02/13/2017  . Right sided sciatica 02/13/2017  . RLQ abdominal pain 08/20/2016  . Anal itching 08/20/2016  . GERD (gastroesophageal reflux disease) 06/05/2016  . Migraine 12/27/2015  . Headache 12/27/2015  . Lateral epicondylitis of left elbow 06/27/2015  . Cervical radiculitis 06/27/2015  . Non-allergic vasomotor rhinitis 06/01/2012    MBess Harvest PTA 06/18/18 6:23 PM   [facility] [address] [place] [address]Phone: [phone]  Fax:  [phone] Name: Mindy STEINERTMRN: [id number] of Birth:  828-May-1962 PHYSICAL THERAPY DISCHARGE SUMMARY  Visits from Start of Care: 8  Current functional level related to goals / functional outcomes:   Refer to above clinical impression for status as of last visit on 06/18/18. Pt was be in touch is she felt she needed to return to PT but no further communication received or appts scheduled in >30 days, therefore will proceed with discharge from PT for this episode.   Remaining deficits:   As above.   Education / Equipment:   HEP  Plan: Patient agrees to discharge.  Patient goals were mostly  met. Patient is being discharged due to being pleased with the current functional level.  ?????        JPercival Spanish PT, MPT 08/21/18, 8:19 AM  [facility] [place] [address]Phone: [phone]  Fax:  [phone]

## 2018-06-19 ENCOUNTER — Ambulatory Visit: Payer: 59 | Admitting: Internal Medicine

## 2018-06-23 ENCOUNTER — Encounter: Payer: Self-pay | Admitting: Family Medicine

## 2018-06-23 ENCOUNTER — Ambulatory Visit: Payer: 59 | Admitting: Family Medicine

## 2018-06-23 VITALS — BP 127/89 | HR 77 | Ht 64.0 in | Wt 140.0 lb

## 2018-06-23 DIAGNOSIS — M25551 Pain in right hip: Secondary | ICD-10-CM | POA: Diagnosis not present

## 2018-06-23 NOTE — Progress Notes (Signed)
PCP: Binnie Rail, MD  Subjective:   HPI: Patient is a 57 y.o. female here for right hip pain.  7/18: Patient reports she's had off and on pain in right hip since October. She had problems with this hip several years ago and was diagnosed with bursitis, given an injection, improved. Denies acute injury but she did trip when running in June, twisted to go toward the grass (but didn't fall) and feels this worsened her current pain. Has a dull ache all the time at 6/10 level but gets sharp pains too. Pain is from groin through to back of hip. No radiation, numbness. Aleve helps. No bowel/bladder dysfunction.  9/3: Patient reports she's doing well. Pain level 0/10 but up to 3/55 with certain movements, can be sharp. Has done well with physical therapy and home exercises. Pain increased since Thursday though as she's done more of the exercises. Pain is more lateral in right hip. Denies groin pain, catching, snapping, locking. Took aleve this morning and Sunday which helped. No skin changes, numbness.  Past Medical History:  Diagnosis Date  . Allergic rhinitis   . Complication of anesthesia   . Elevated transaminase level 12.2014   ?etiology - present 09/2013 hosp for epigastric pain  . Family history of adverse reaction to anesthesia    father - PONV  . GERD (gastroesophageal reflux disease)   . History of bronchitis 10/2014  . Migraine   . PONV (postoperative nausea and vomiting)   . Seasonal allergies     Current Outpatient Medications on File Prior to Visit  Medication Sig Dispense Refill  . butalbital-acetaminophen-caffeine (FIORICET, ESGIC) 50-325-40 MG tablet Take 1 tablet by mouth every 6 (six) hours as needed for headache. 75 tablet 5  . DEXILANT 60 MG capsule TAKE 1 CAPSULE (60 MG TOTAL) BY MOUTH DAILY. 90 capsule 1  . DYMISTA 137-50 MCG/ACT SUSP Place 2 sprays into both nostrils at bedtime. 23 g 11  . eletriptan (RELPAX) 40 MG tablet TAKE 1 TABLET BY MOUTH AS  NEEDED FOR MIGRAINE OR HEADACHE. MAY REPEAT IN 2 HOURS IF HEADACHE PERSISTS OR RECURS 10 tablet 2  . estradiol (MINIVELLE) 0.05 MG/24HR patch Minivelle 0.05 mg/24 hr transdermal patch    . estrogens, conjugated, (PREMARIN) 0.625 MG tablet Take 0.625 mg by mouth daily.     . hydrocortisone (ANUSOL-HC) 2.5 % rectal cream Place 1 application rectally 2 (two) times daily. 30 g 11  . linaclotide (LINZESS) 72 MCG capsule Take 1 capsule (72 mcg total) by mouth daily. 180 capsule 3  . sodium chloride (OCEAN) 0.65 % nasal spray Place 1 spray into the nose 4 (four) times daily as needed for congestion (and dryness).     No current facility-administered medications on file prior to visit.     Past Surgical History:  Procedure Laterality Date  . BREAST BIOPSY Right   . BREAST EXCISIONAL BIOPSY    . BREAST LUMPECTOMY WITH RADIOACTIVE SEED LOCALIZATION Right 10/08/2016   Procedure: RIGHT BREAST LUMPECTOMY WITH RADIOACTIVE SEED LOCALIZATION;  Surgeon: Coralie Keens, MD;  Location: [place];  Service: General;  Laterality: Right;  . CHOLECYSTECTOMY N/A 09/21/2015   Procedure: LAPAROSCOPIC CHOLECYSTECTOMY;  Surgeon: Coralie Keens, MD;  Location: [place];  Service: General;  Laterality: N/A;  . COLONOSCOPY  12/2013  . NECK SURGERY  2009  . PARTIAL HYSTERECTOMY  2007  . UPPER GI ENDOSCOPY  2016    Allergies  Allergen Reactions  . Oxaprozin Hives and Itching    "daypro"  Social History   Socioeconomic History  . Marital status: Divorced    Spouse name: Not on file  . Number of children: Not on file  . Years of education: Not on file  . Highest education level: Not on file  Occupational History  . Occupation: Therapist, sports  Social Needs  . Financial resource strain: Not on file  . Food insecurity:    Worry: Not on file    Inability: Not on file  . Transportation needs:    Medical: Not on file    Non-medical: Not on file  Tobacco Use  . Smoking status: Never Smoker  . Smokeless tobacco: Never Used   Substance and Sexual Activity  . Alcohol use: Yes    Comment: occ  . Drug use: No  . Sexual activity: Not on file  Lifestyle  . Physical activity:    Days per week: Not on file    Minutes per session: Not on file  . Stress: Not on file  Relationships  . Social connections:    Talks on phone: Not on file    Gets together: Not on file    Attends religious service: Not on file    Active member of club or organization: Not on file    Attends meetings of clubs or organizations: Not on file    Relationship status: Not on file  . Intimate partner violence:    Fear of current or ex partner: Not on file    Emotionally abused: Not on file    Physically abused: Not on file    Forced sexual activity: Not on file  Other Topics Concern  . Not on file  Social History Narrative   Exercise: on average 3/week - running, weights sometimes    Family History  Problem Relation Age of Onset  . Atrial fibrillation Father   . Diabetes type II Father   . Hypertension Father   . Coronary artery disease Father 81       stent x 2, AoVR  . Hypertension Mother   . Dementia Mother   . Hypertension Brother   . Breast cancer Maternal Aunt   . Breast cancer Maternal Grandmother   . Breast cancer Paternal Grandmother     BP 127/89   Pulse 77   Ht 5\' 4"  (1.626 m)   Wt 140 lb (63.5 kg)   BMI 24.03 kg/m   Review of Systems: See HPI above.     Objective:  Physical Exam:  Gen: NAD, comfortable in exam room  Back: No gross deformity, scoliosis. TTP .  No midline or bony TTP. FROM without pain. Strength LEs 5/5 all muscle groups.   2+ MSRs in patellar and achilles tendons, equal bilaterally. Negative SLRs. Sensation intact to light touch bilaterally.  Right hip: No deformity. FROM with 5/5 strength. Mild TTP proximal IT band, gluteal muscles, external rotators. NVI distally. Negative logroll bilateral hips Negative fabers and piriformis stretches. Clunk felt with extension to  flexion but no pain.   Assessment & Plan:  1. Right hip pain - radiographs negative.  Exam still concerning for labral tear of hip but primary pain now due to secondary muscle spasms/strain.  She is clinically improving - she would like to transition to HEP and hold PT for now.  Ice/heat if needed, aleve if needed.  Consider injection (bursa or intraarticular), MR arthrogram if not improving.  F/u in 6 weeks or prn if doing well.

## 2018-06-23 NOTE — Patient Instructions (Signed)
Do your home exercises most days of the week now. Aleve if needed. Wait about 2-3 more weeks before going back to running - typically you start with walk/jog intervals for a short time period (10 total minutes) every other day, increase as tolerated. Ice/heat if needed. Consider intraarticular injection, bursa injection, MRI arthrogram if not improving as expected. Follow up with me in 6 weeks or as needed if you're doing well. Email me if you have any questions/concerns.

## 2018-06-24 MED FILL — BUTALB-ACETAMIN-CAFF 50-325: 50-325-40 | 19 days supply | Qty: 75 | Fill #5

## 2018-07-02 ENCOUNTER — Encounter: Payer: Self-pay | Admitting: Family Medicine

## 2018-07-03 ENCOUNTER — Ambulatory Visit
Admission: RE | Admit: 2018-07-03 | Discharge: 2018-07-03 | Disposition: A | Discharge status: Home / Self Care | Payer: Self-pay | Source: Ambulatory Visit | Attending: Family Medicine | Admitting: Family Medicine

## 2018-07-03 ENCOUNTER — Ambulatory Visit (INDEPENDENT_AMBULATORY_CARE_PROVIDER_SITE_OTHER): Payer: 59 | Admitting: Family Medicine

## 2018-07-03 ENCOUNTER — Encounter: Payer: Self-pay | Admitting: Family Medicine

## 2018-07-03 VITALS — BP 110/80 | Ht 64.0 in | Wt 140.0 lb

## 2018-07-03 DIAGNOSIS — M25552 Pain in left hip: Secondary | ICD-10-CM | POA: Diagnosis not present

## 2018-07-03 DIAGNOSIS — S79912A Unspecified injury of left hip, initial encounter: Secondary | ICD-10-CM | POA: Diagnosis not present

## 2018-07-03 DIAGNOSIS — S79911A Unspecified injury of right hip, initial encounter: Secondary | ICD-10-CM | POA: Insufficient documentation

## 2018-07-03 DIAGNOSIS — M25551 Pain in right hip: Secondary | ICD-10-CM
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
## 2018-07-06 MED FILL — PREMARIN 0.9 MG TABLET: 0.9 | 84 days supply | Qty: 84 | Fill #1

## 2018-07-06 MED FILL — ELETRIPTAN HYDROBROMIDE 40: 40 | 30 days supply | Qty: 6 | Fill #4

## 2018-07-09 ENCOUNTER — Ambulatory Visit
Admission: RE | Admit: 2018-07-09 | Discharge: 2018-07-09 | Disposition: A | Discharge status: Home / Self Care | Payer: 59 | Source: Ambulatory Visit | Attending: Family Medicine | Admitting: Family Medicine

## 2018-07-09 DIAGNOSIS — M25551 Pain in right hip: Secondary | ICD-10-CM

## 2018-07-09 DIAGNOSIS — S79911A Unspecified injury of right hip, initial encounter: Secondary | ICD-10-CM | POA: Diagnosis not present

## 2018-07-13 ENCOUNTER — Encounter: Payer: 59 | Admitting: Internal Medicine

## 2018-07-16 ENCOUNTER — Encounter: Payer: Self-pay | Admitting: Family Medicine

## 2018-07-27 ENCOUNTER — Ambulatory Visit: Payer: 59 | Admitting: Internal Medicine

## 2018-07-27 ENCOUNTER — Encounter: Payer: Self-pay | Admitting: Internal Medicine

## 2018-07-27 VITALS — BP 112/82 | HR 66 | Ht 63.75 in | Wt 146.0 lb

## 2018-07-27 DIAGNOSIS — J300 Vasomotor rhinitis: Secondary | ICD-10-CM

## 2018-07-27 DIAGNOSIS — Z23 Encounter for immunization: Secondary | ICD-10-CM

## 2018-07-27 DIAGNOSIS — G44209 Tension-type headache, unspecified, not intractable: Secondary | ICD-10-CM | POA: Diagnosis not present

## 2018-07-27 MED ORDER — BUTALBITAL-APAP-CAFFEINE 50-325-40 MG PO TABS: 1.0000 | ORAL_TABLET | Freq: Four times a day (QID) | ORAL | 5 refills | Status: DC | PRN

## 2018-07-27 MED ORDER — DYMISTA 137-50 MCG/ACT NA SUSP: 2.0000 | Freq: Every day | NASAL | 11 refills | Status: DC

## 2018-07-27 NOTE — Patient Instructions (Signed)
Order- Flu vax standard  Scripts printed for Fioricet and Dymista  Please call if we can help

## 2018-07-31 DIAGNOSIS — H52222 Regular astigmatism, left eye: Secondary | ICD-10-CM | POA: Diagnosis not present

## 2018-07-31 DIAGNOSIS — H524 Presbyopia: Secondary | ICD-10-CM | POA: Diagnosis not present

## 2018-07-31 DIAGNOSIS — H52221 Regular astigmatism, right eye: Secondary | ICD-10-CM | POA: Diagnosis not present

## 2018-08-03 MED FILL — DYMISTA NASAL SPRAY: 137-50 | 30 days supply | Qty: 23 | Fill #0

## 2018-08-03 MED FILL — BUTALB-ACETAMIN-CAFF 50-325: 50-325-40 | 19 days supply | Qty: 75 | Fill #0

## 2018-08-03 NOTE — Assessment & Plan Note (Deleted)
Chronic pattern for her, now associated with increased sinus congestion. Plan-refill Fioricet

## 2018-08-03 NOTE — Assessment & Plan Note (Deleted)
There is no purulent change, fever or other evidence of bacterial infection so we are holding off on antibiotics.  Discussed management of symptoms. Plan-refill Dymista nasal spray, refill Fioricet for headache.  Flu vaccine

## 2018-08-04 NOTE — Assessment & Plan Note (Signed)
Headache is an familiar pattern for her, probably active now because of her sense of sinus congestion which is being addressed. Plan-refill Fioricet, flu vaccine

## 2018-08-04 NOTE — Progress Notes (Signed)
Patient ID: Mindy [name], female    DOB: [birth year], 57 y.o.   MRN: [id number]  HPI female never smoker followed for chronic rhinitis/sinusitis, complicated by headache Allergy profile 12/27/13- Neg, Total IgE 4.5 Had failed allergy vaccine Eos not elevated 09/12/14 Sinus xray 12/12/2015-no evidence of acute sinusitis, clear -------------------------------------------------------------------  04/03/17- 57 year old female never smoker followed for chronic rhinitis/sinusitis, complicated by headache, breast cancer FOLLOW UP FOR 6 months patients states that she is feeling good. patient states that it is only the seasonal allergie going on patient is not sleeping  Right sphenoid sinusitis and a cracked tooth overlapped to cause pain in March but that has resolved with appropriate management and she now feels well. Mild variable nasal congestion for which she asks refill Dymista. Insomnia more of a problem recently, aggravated by menopause symptoms with hot flashes, and personal issues with stress. Difficulty initiating and maintaining sleep. She wants to avoid morning sleepiness. We discussed sleep habits and management. CTmax/fac 12/27/16- by Dr Wilburn Cornelia 1. Right sphenoid sinusitis. 2. No other significant sinus disease.  07/27/2018- 57 year old female never smoker followed for chronic rhinitis/sinusitis, complicated by headache, breast cancer -----reports some sinus congestion with clear drainage, PND, HA, throat tickle and cough with the recent weather change She is noticing seasonal exacerbation of her chronic sinus pressure/drainage/headache pattern.  We discussed previous treatment and ENT evaluation.  Denies purulent discharge or fever. Chest feels clear without wheeze or cough.  Review of Systems- see HPI   + sign = positive Constitutional:   No-   weight loss, night sweats, fevers, chills, fatigue, lassitude. HEENT:   +  headaches,  No-difficulty swallowing, tooth/dental problems, sore  throat,       sneezing, itching, ear ache, +nasal congestion, post nasal drip,  CV:  chest pain, orthopnea, PND, swelling in lower extremities, anasarca, dizziness, palpitations Resp: +shortness of breath with exertion or at rest.              No-   productive cough,  non-productive cough,  No-  coughing up of blood.              No-   change in color of mucus.  No- wheezing.   Skin: No-   rash or lesions. GI:  No-   heartburn, indigestion, abdominal pain, nausea, vomiting,  GU:  MS:  No-   joint pain or swelling.   Neuro- nothing unusual Psych:  No- change in mood or affect. No depression or anxiety.  No memory loss.  Objective:   Physical Exam General- Alert, Oriented, Affect-appropriate, Distress- none, + Looks comfortable today Skin- rash-none, lesions- none, excoriation- none Lymphadenopathy- none Head- atraumatic            Eyes- Gross vision intact, PERRLA, conjunctivae clear secretions            Ears- Hearing, canals            Nose- , +turbinate edema, +Septal dev, no-mucus,  No-polyps, erosion, perforation.             Throat- Mallampati III-IV , mucosa clear , drainage- none, tonsils- atrophic Neck- flexible , trachea midline, no stridor , thyroid nl, carotid no bruit Chest - symmetrical excursion , unlabored           Heart/CV- RRR , no murmur , no gallop  , no rub, nl s1 s2                           -  JVD- none , edema- none, stasis changes- none, varices- none           Lung-  wheeze -none, cough-none , dullness-none, rub- none           Chest wall-  Abd-  Br/ Gen/ Rectal- Not done, not indicated Extrem- cyanosis- none, clubbing, none, atrophy- none, strength- nl Neuro- grossly intact to observation

## 2018-08-04 NOTE — Assessment & Plan Note (Signed)
Exacerbation of rhinitis.  We agree that there was no indication now for antibiotic. Plan-refill Dymista nasal spray, continue saline rinse, occasional use of Afrin

## 2018-08-25 ENCOUNTER — Encounter: Payer: Self-pay | Admitting: Internal Medicine

## 2018-08-25 ENCOUNTER — Other Ambulatory Visit: Payer: Self-pay | Admitting: Internal Medicine

## 2018-08-25 MED ORDER — ELETRIPTAN HYDROBROMIDE 40 MG PO TABS: ORAL_TABLET | ORAL | 0 refills | Status: DC

## 2018-08-25 MED ORDER — DEXILANT 60 MG PO CPDR: 60.0000 mg | DELAYED_RELEASE_CAPSULE | Freq: Every day | ORAL | 0 refills | Status: DC

## 2018-08-25 MED FILL — DEXILANT DR 60 MG CAPSULE: 60 | 30 days supply | Qty: 30 | Fill #0

## 2018-08-25 MED FILL — ELETRIPTAN HYDROBROMIDE 40: 40 | 30 days supply | Qty: 10 | Fill #0

## 2018-08-31 ENCOUNTER — Other Ambulatory Visit (HOSPITAL_BASED_OUTPATIENT_CLINIC_OR_DEPARTMENT_OTHER): Payer: Self-pay | Admitting: Obstetrics and Gynecology

## 2018-08-31 DIAGNOSIS — Z1231 Encounter for screening mammogram for malignant neoplasm of breast: Secondary | ICD-10-CM

## 2018-09-03 ENCOUNTER — Ambulatory Visit (HOSPITAL_BASED_OUTPATIENT_CLINIC_OR_DEPARTMENT_OTHER)
Admission: RE | Admit: 2018-09-03 | Discharge: 2018-09-03 | Disposition: A | Discharge status: Home / Self Care | Payer: 59 | Source: Ambulatory Visit | Attending: Obstetrics and Gynecology | Admitting: Obstetrics and Gynecology

## 2018-09-03 DIAGNOSIS — Z1231 Encounter for screening mammogram for malignant neoplasm of breast: Secondary | ICD-10-CM | POA: Diagnosis not present
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
## 2018-09-06 NOTE — Patient Instructions (Addendum)
Other preventative medications for migraines:  Venlafaxine, nortriptyline and aimovig (injection once a month).  Tests ordered today. Your results will be released to [place] (or called to you) after review, usually within 72hours after test completion. If any changes need to be made, you will be notified at that same time.  All other Health Maintenance issues reviewed.   All recommended immunizations and age-appropriate screenings are up-to-date or discussed.  No immunizations administered today.   Medications reviewed and updated.  Changes include :   none  Your prescription(s) have been submitted to your pharmacy. Please take as directed and contact our office if you believe you are having problem(s) with the medication(s).   Please followup in one year   Health Maintenance, Female Adopting a healthy lifestyle and getting preventive care can go a long way to promote health and wellness. Talk with your health care provider about what schedule of regular examinations is right for you. This is a good chance for you to check in with your provider about disease prevention and staying healthy. In between checkups, there are plenty of things you can do on your own. Experts have done a lot of research about which lifestyle changes and preventive measures are most likely to keep you healthy. Ask your health care provider for more information. Weight and diet Eat a healthy diet  Be sure to include plenty of vegetables, fruits, low-fat dairy products, and lean protein.  Do not eat a lot of foods high in solid fats, added sugars, or salt.  Get regular exercise. This is one of the most important things you can do for your health. ? Most adults should exercise for at least 150 minutes each week. The exercise should increase your heart rate and make you sweat (moderate-intensity exercise). ? Most adults should also do strengthening exercises at least twice a week. This is in addition to the  moderate-intensity exercise.  Maintain a healthy weight  Body mass index (BMI) is a measurement that can be used to identify possible weight problems. It estimates body fat based on height and weight. Your health care provider can help determine your BMI and help you achieve or maintain a healthy weight.  For females 66 years of age and older: ? A BMI below 18.5 is considered underweight. ? A BMI of 18.5 to 24.9 is normal. ? A BMI of 25 to 29.9 is considered overweight. ? A BMI of 30 and above is considered obese.  Watch levels of cholesterol and blood lipids  You should start having your blood tested for lipids and cholesterol at 55 years of age, then have this test every 5 years.  You may need to have your cholesterol levels checked more often if: ? Your lipid or cholesterol levels are high. ? You are older than 55 years of age. ? You are at high risk for heart disease.  Cancer screening Lung Cancer  Lung cancer screening is recommended for adults 46-51 years old who are at high risk for lung cancer because of a history of smoking.  A yearly low-dose CT scan of the lungs is recommended for people who: ? Currently smoke. ? Have quit within the past 15 years. ? Have at least a 30-pack-year history of smoking. A pack year is smoking an average of one pack of cigarettes a day for 1 year.  Yearly screening should continue until it has been 15 years since you quit.  Yearly screening should stop if you develop a health problem that  would prevent you from having lung cancer treatment.  Breast Cancer  Practice breast self-awareness. This means understanding how your breasts normally appear and feel.  It also means doing regular breast self-exams. Let your health care provider know about any changes, no matter how small.  If you are in your 20s or 30s, you should have a clinical breast exam (CBE) by a health care provider every 1-3 years as part of a regular health exam.  If you  are 54 or older, have a CBE every year. Also consider having a breast X-ray (mammogram) every year.  If you have a family history of breast cancer, talk to your health care provider about genetic screening.  If you are at high risk for breast cancer, talk to your health care provider about having an MRI and a mammogram every year.  Breast cancer gene (BRCA) assessment is recommended for women who have family members with BRCA-related cancers. BRCA-related cancers include: ? Breast. ? Ovarian. ? Tubal. ? Peritoneal cancers.  Results of the assessment will determine the need for genetic counseling and BRCA1 and BRCA2 testing.  Cervical Cancer Your health care provider may recommend that you be screened regularly for cancer of the pelvic organs (ovaries, uterus, and vagina). This screening involves a pelvic examination, including checking for microscopic changes to the surface of your cervix (Pap test). You may be encouraged to have this screening done every 3 years, beginning at age 13.  For women ages 48-65, health care providers may recommend pelvic exams and Pap testing every 3 years, or they may recommend the Pap and pelvic exam, combined with testing for human papilloma virus (HPV), every 5 years. Some types of HPV increase your risk of cervical cancer. Testing for HPV may also be done on women of any age with unclear Pap test results.  Other health care providers may not recommend any screening for nonpregnant women who are considered low risk for pelvic cancer and who do not have symptoms. Ask your health care provider if a screening pelvic exam is right for you.  If you have had past treatment for cervical cancer or a condition that could lead to cancer, you need Pap tests and screening for cancer for at least 20 years after your treatment. If Pap tests have been discontinued, your risk factors (such as having a new sexual partner) need to be reassessed to determine if screening should  resume. Some women have medical problems that increase the chance of getting cervical cancer. In these cases, your health care provider may recommend more frequent screening and Pap tests.  Colorectal Cancer  This type of cancer can be detected and often prevented.  Routine colorectal cancer screening usually begins at 55 years of age and continues through 55 years of age.  Your health care provider may recommend screening at an earlier age if you have risk factors for colon cancer.  Your health care provider may also recommend using home test kits to check for hidden blood in the stool.  A small camera at the end of a tube can be used to examine your colon directly (sigmoidoscopy or colonoscopy). This is done to check for the earliest forms of colorectal cancer.  Routine screening usually begins at age 37.  Direct examination of the colon should be repeated every 5-10 years through 55 years of age. However, you may need to be screened more often if early forms of precancerous polyps or small growths are found.  Skin Cancer  Check  your skin from head to toe regularly.  Tell your health care provider about any new moles or changes in moles, especially if there is a change in a mole's shape or color.  Also tell your health care provider if you have a mole that is larger than the size of a pencil eraser.  Always use sunscreen. Apply sunscreen liberally and repeatedly throughout the day.  Protect yourself by wearing long sleeves, pants, a wide-brimmed hat, and sunglasses whenever you are outside.  Heart disease, diabetes, and high blood pressure  High blood pressure causes heart disease and increases the risk of stroke. High blood pressure is more likely to develop in: ? People who have blood pressure in the high end of the normal range (130-139/85-89 mm Hg). ? People who are overweight or obese. ? People who are African American.  If you are 25-17 years of age, have your blood  pressure checked every 3-5 years. If you are 71 years of age or older, have your blood pressure checked every year. You should have your blood pressure measured twice-once when you are at a hospital or clinic, and once when you are not at a hospital or clinic. Record the average of the two measurements. To check your blood pressure when you are not at a hospital or clinic, you can use: ? An automated blood pressure machine at a pharmacy. ? A home blood pressure monitor.  If you are between 74 years and 89 years old, ask your health care provider if you should take aspirin to prevent strokes.  Have regular diabetes screenings. This involves taking a blood sample to check your fasting blood sugar level. ? If you are at a normal weight and have a low risk for diabetes, have this test once every three years after 55 years of age. ? If you are overweight and have a high risk for diabetes, consider being tested at a younger age or more often. Preventing infection Hepatitis B  If you have a higher risk for hepatitis B, you should be screened for this virus. You are considered at high risk for hepatitis B if: ? You were born in a country where hepatitis B is common. Ask your health care provider which countries are considered high risk. ? Your parents were born in a high-risk country, and you have not been immunized against hepatitis B (hepatitis B vaccine). ? You have HIV or AIDS. ? You use needles to inject street drugs. ? You live with someone who has hepatitis B. ? You have had sex with someone who has hepatitis B. ? You get hemodialysis treatment. ? You take certain medicines for conditions, including cancer, organ transplantation, and autoimmune conditions.  Hepatitis C  Blood testing is recommended for: ? Everyone born from [birth year] through 1963. ? Anyone with known risk factors for hepatitis C.  Sexually transmitted infections (STIs)  You should be screened for sexually transmitted  infections (STIs) including gonorrhea and chlamydia if: ? You are sexually active and are younger than 55 years of age. ? You are older than 55 years of age and your health care provider tells you that you are at risk for this type of infection. ? Your sexual activity has changed since you were last screened and you are at an increased risk for chlamydia or gonorrhea. Ask your health care provider if you are at risk.  If you do not have HIV, but are at risk, it may be recommended that you take a prescription medicine  daily to prevent HIV infection. This is called pre-exposure prophylaxis (PrEP). You are considered at risk if: ? You are sexually active and do not regularly use condoms or know the HIV status of your partner(s). ? You take drugs by injection. ? You are sexually active with a partner who has HIV.  Talk with your health care provider about whether you are at high risk of being infected with HIV. If you choose to begin PrEP, you should first be tested for HIV. You should then be tested every 3 months for as long as you are taking PrEP. Pregnancy  If you are premenopausal and you may become pregnant, ask your health care provider about preconception counseling.  If you may become pregnant, take 400 to 800 micrograms (mcg) of folic acid every day.  If you want to prevent pregnancy, talk to your health care provider about birth control (contraception). Osteoporosis and menopause  Osteoporosis is a disease in which the bones lose minerals and strength with aging. This can result in serious bone fractures. Your risk for osteoporosis can be identified using a bone density scan.  If you are 23 years of age or older, or if you are at risk for osteoporosis and fractures, ask your health care provider if you should be screened.  Ask your health care provider whether you should take a calcium or vitamin D supplement to lower your risk for osteoporosis.  Menopause may have certain physical  symptoms and risks.  Hormone replacement therapy may reduce some of these symptoms and risks. Talk to your health care provider about whether hormone replacement therapy is right for you. Follow these instructions at home:  Schedule regular health, dental, and eye exams.  Stay current with your immunizations.  Do not use any tobacco products including cigarettes, chewing tobacco, or electronic cigarettes.  If you are pregnant, do not drink alcohol.  If you are breastfeeding, limit how much and how often you drink alcohol.  Limit alcohol intake to no more than 1 drink per day for nonpregnant women. One drink equals 12 ounces of beer, 5 ounces of wine, or 1 ounces of hard liquor.  Do not use street drugs.  Do not share needles.  Ask your health care provider for help if you need support or information about quitting drugs.  Tell your health care provider if you often feel depressed.  Tell your health care provider if you have ever been abused or do not feel safe at home. This information is not intended to replace advice given to you by your health care provider. Make sure you discuss any questions you have with your health care provider. Document Released: 04/22/2011 Document Revised: 03/14/2016 Document Reviewed: 07/11/2015 Elsevier Interactive Patient Education  [employer].

## 2018-09-07 ENCOUNTER — Ambulatory Visit: Payer: 59 | Admitting: Internal Medicine

## 2018-09-07 ENCOUNTER — Encounter: Payer: Self-pay | Admitting: Internal Medicine

## 2018-09-07 VITALS — BP 126/76 | HR 65 | Temp 98.1°F | Resp 16 | Ht 63.75 in | Wt 143.8 lb

## 2018-09-07 DIAGNOSIS — K219 Gastro-esophageal reflux disease without esophagitis: Secondary | ICD-10-CM | POA: Diagnosis not present

## 2018-09-07 DIAGNOSIS — E782 Mixed hyperlipidemia: Secondary | ICD-10-CM | POA: Diagnosis not present

## 2018-09-07 DIAGNOSIS — G43009 Migraine without aura, not intractable, without status migrainosus: Secondary | ICD-10-CM | POA: Diagnosis not present

## 2018-09-07 DIAGNOSIS — Z0000 Encounter for general adult medical examination without abnormal findings: Secondary | ICD-10-CM | POA: Diagnosis not present

## 2018-09-07 DIAGNOSIS — E785 Hyperlipidemia, unspecified: Secondary | ICD-10-CM | POA: Insufficient documentation

## 2018-09-07 MED ORDER — ELETRIPTAN HYDROBROMIDE 40 MG PO TABS: ORAL_TABLET | ORAL | 11 refills | Status: DC

## 2018-09-07 MED ORDER — DEXLANSOPRAZOLE 30 MG PO CPDR: 30.0000 mg | DELAYED_RELEASE_CAPSULE | Freq: Every day | ORAL | 3 refills | Status: DC

## 2018-09-07 NOTE — Assessment & Plan Note (Addendum)
She is having frequent migraines-we will most likely recent trigger is increased stress from work [employer] as needed and sometimes Relpax, but only gets 6 Relpax a month Overall she thinks her migraines are controlled, but does have very frequent headaches in addition to the migraines Discussed preventative medications-her blood pressures on the low side so blood pressure medication would not be the best option She does not want to try Topamax Discussed venlafaxine or nortriptyline.  Can also consider Aimovig She will monitor for now and let me know if she wants to try something different Stress reduction-restart regular exercise once able

## 2018-09-07 NOTE — Assessment & Plan Note (Signed)
Elevated cholesterol in the past We will check lipid panel, CMP, TSH Not currently exercising, but will restart when able Heart healthy diet ASCVD risk low using last years numbers, so not a candidate for a statin at this time Continue to work on lifestyle

## 2018-09-07 NOTE — Assessment & Plan Note (Signed)
Taking Dexilant 60 mg daily-can go a few days without symptoms We will try decreasing Dexilant to 30 mg daily She will let me know if this is not controlled

## 2018-09-15 MED FILL — BUTALB-ACETAMIN-CAFF 50-325: 50-325-40 | 19 days supply | Qty: 75 | Fill #1

## 2018-10-07 MED FILL — DEXILANT DR 30 MG CAPSULE: 30 | 90 days supply | Qty: 90 | Fill #0

## 2018-10-12 ENCOUNTER — Ambulatory Visit
Admission: RE | Admit: 2018-10-12 | Discharge: 2018-10-12 | Disposition: A | Discharge status: Home / Self Care | Payer: 59 | Source: Ambulatory Visit | Attending: Sports Medicine | Admitting: Sports Medicine

## 2018-10-12 ENCOUNTER — Encounter: Payer: Self-pay | Admitting: Sports Medicine

## 2018-10-12 ENCOUNTER — Ambulatory Visit: Payer: 59 | Admitting: Sports Medicine

## 2018-10-12 VITALS — BP 138/81 | Ht 64.0 in | Wt 143.0 lb

## 2018-10-12 DIAGNOSIS — M542 Cervicalgia: Secondary | ICD-10-CM

## 2018-10-12 DIAGNOSIS — M5010 Cervical disc disorder with radiculopathy, unspecified cervical region: Secondary | ICD-10-CM | POA: Insufficient documentation

## 2018-10-12 DIAGNOSIS — M50322 Other cervical disc degeneration at C5-C6 level: Secondary | ICD-10-CM | POA: Diagnosis not present

## 2018-10-12 MED ORDER — PREDNISONE 20 MG PO TABS: 20.0000 mg | ORAL_TABLET | Freq: Two times a day (BID) | ORAL | 0 refills | Status: DC

## 2018-10-12 MED FILL — predniSONE 20 MG TABS: 20 | 7 days supply | Qty: 14 | Fill #0
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
## 2018-10-12 NOTE — Patient Instructions (Signed)
Today we diagnosed you with a left-sided cervical radiculopathy.  This means that you have an irritated nerve in your neck.  We are recommending:  1.  Prednisone 20 mg twice daily for 1 week 2.  Isometric neck exercises to be performed daily.  Please see the handout for these. 3.  X-rays of your cervical spine to look at the level below your cervical fusion 4.  Please call us if you are not improving after the prednisone burst otherwise follow-up tentatively in 2 to 3 weeks

## 2018-10-12 NOTE — Assessment & Plan Note (Addendum)
-  History of C6-7 cervical fusion in 2007 -Symptoms are acute in nature -Recommend pharmacologic intervention with prednisone 20 mg twice daily x7 days.  Counseled on common and severe side effects of steroids. -Isometric neck exercises given to be performed pain-free.  Caution on neck extension exercises.  Will perform these daily for the next 4 to 6 weeks. -Cervical x-rays ordered to evaluate the C7-T1 region of her spine after her fusion surgery -Counseled on red flag symptoms although she is not currently demonstrating any of the symptoms. -Follow-up in 3 to 5 weeks depending on her improvement with steroids. -Soft cervical collar provided for comfort

## 2018-10-15 ENCOUNTER — Other Ambulatory Visit: Payer: Self-pay | Admitting: Internal Medicine

## 2018-10-15 MED FILL — LINZESS 72 MCG CAPSULE: 72 | 30 days supply | Qty: 30 | Fill #0

## 2018-10-15 MED FILL — PREMARIN 0.9 MG TABLET: 0.9 | 84 days supply | Qty: 84 | Fill #2

## 2018-10-27 ENCOUNTER — Ambulatory Visit: Payer: 59 | Admitting: Sports Medicine

## 2018-10-27 MED FILL — BUTALB-ACETAMIN-CAFF 50-325: 50-325-40 | 19 days supply | Qty: 75 | Fill #2

## 2018-10-27 MED FILL — ELETRIPTAN HYDROBROMIDE 40: 40 | 30 days supply | Qty: 10 | Fill #0

## 2018-11-19 ENCOUNTER — Encounter: Payer: Self-pay | Admitting: Sports Medicine

## 2018-11-19 ENCOUNTER — Ambulatory Visit: Payer: 59 | Admitting: Sports Medicine

## 2018-11-19 VITALS — BP 130/80 | Ht 64.0 in | Wt 143.0 lb

## 2018-11-19 DIAGNOSIS — M5010 Cervical disc disorder with radiculopathy, unspecified cervical region: Secondary | ICD-10-CM | POA: Diagnosis not present

## 2018-11-19 MED ORDER — TRAMADOL HCL 50 MG PO TABS: 50.0000 mg | ORAL_TABLET | Freq: Three times a day (TID) | ORAL | 0 refills | Status: DC

## 2018-11-19 MED ORDER — GABAPENTIN 300 MG PO CAPS: 300.0000 mg | ORAL_CAPSULE | Freq: Every day | ORAL | 2 refills | Status: DC

## 2018-11-19 MED FILL — traMADol HCL 50 MG TABS: 50 | 7 days supply | Qty: 21 | Fill #0

## 2018-11-19 MED FILL — GABAPENTIN 300 MG CAPSULE: 300 | 30 days supply | Qty: 30 | Fill #0

## 2018-11-19 NOTE — Progress Notes (Signed)
  Mindy [name] - 58 y.o. female MRN [id number]  Date of birth: 12/08/1960    SUBJECTIVE:      Chief Complaint: Neck pain  HPI:  58 year old female here for follow-up for neck pain with radiculopathy.  She was last seen on 10/12/2018 at which time she was started on prednisone, cervical x-rays obtained, and placed in c-collar.  She notes that while taking the steroids and for about almost 2 weeks after she was significantly better.  Following this, her symptoms return.  Today she is expressing less neck stiffness than previously however she continues to have the same amount of pain and numbness/tingling in the ring and little finger of her left hand.  She is now taking Aleve twice daily with minimal benefit.  She reports limited range of motion of the cervical spine.  She denies any feeling of muscle weakness.  No skin changes.  No headaches   ROS:     See HPI  PERTINENT  PMH / PSH FH / / SH:  Past Medical, Surgical, Social, and Family History Reviewed & Updated in the EMR.    OBJECTIVE: BP 130/80   Ht 5\' 4"  (1.626 m)   Wt 143 lb (64.9 kg)   BMI 24.55 kg/m   Physical Exam:  Vital signs are reviewed.  GEN: Alert and oriented, NAD Pulm: Breathing unlabored PSY: normal mood, congruent affect  MSK: Cervical spine: No obvious deformity Spasm of left trapezius Tenderness diffusely around the lower cervical segments Significantly limited range of motion with extension and rotation to the left.  Better but still limited flexion and right rotation Patient has 5/5 strength throughout the right upper extremity.  The left upper extremity is 4+/5 strength with finger abduction/abduction and finger flexion.  Otherwise, she has 5/5 strength in the left upper extremity.   She does report decreased sensation to light touch in the ring and little fingers of the left hand.  Otherwise, sensation is intact.  She seems to have 1+ DTRs in the left triceps, the remainder of reflex testing bilaterally is  2+   ASSESSMENT & PLAN:  1.  Neck pain with cervical radiculopathy in the C8 dermatome distribution with corresponding mild weakness.  Previous x-rays ordered were independently reviewed today.  Stable C6-C7 fusion.  Otherwise some mild degenerative changes with no acute bony abnormalities. -We will start her on gabapentin 300 mg at night -Tramadol for more severe pain - We will refer her to Dr. Sherwood Gambler for further evaluation as he performed her previous spinal fusion  I observed and examined the patient with Dr Okey Dupre the [facility] and agree with assessment and plan.  Note reviewed and modified by me. Ila Mcgill, MD

## 2018-11-30 MED FILL — ELETRIPTAN HYDROBROMIDE 40: 40 | 30 days supply | Qty: 10 | Fill #1

## 2018-11-30 MED FILL — BUTALB-ACETAMIN-CAFF 50-325: 50-325-40 | 19 days supply | Qty: 75 | Fill #3

## 2018-12-30 MED FILL — ELETRIPTAN HYDROBROMIDE 40: 40 | 30 days supply | Qty: 10 | Fill #2 | Status: TO

## 2018-12-30 MED FILL — BUTALB-ACETAMIN-CAFF 50-325: 50-325-40 | 19 days supply | Qty: 75 | Fill #4 | Status: TO

## 2019-01-07 MED FILL — LINZESS 72 MCG CAPSULE: 72 | 30 days supply | Qty: 30 | Fill #1 | Status: TO

## 2019-01-15 MED FILL — PREMARIN 0.9 MG TABLET: 0.9 | 84 days supply | Qty: 84 | Fill #0

## 2019-01-15 MED FILL — DEXILANT DR 30 MG CAPSULE: 30 | 90 days supply | Qty: 90 | Fill #0

## 2019-02-01 MED FILL — BUTALBITAL-APAP-CAFFEINE 50: 50-325-40 | 19 days supply | Qty: 75 | Fill #0

## 2019-02-01 MED FILL — ELETRIPTAN HBR 40 MG TABLET: 40 | 30 days supply | Qty: 10 | Fill #0

## 2019-03-08 MED ORDER — BUTALBITAL-APAP-CAFFEINE 50-325-40 MG PO TABS: 1.0000 | ORAL_TABLET | Freq: Four times a day (QID) | ORAL | 5 refills | Status: DC | PRN

## 2019-03-08 MED FILL — BUTALBITAL-APAP-CAFFEINE 50: 50-325-40 | 18 days supply | Qty: 75 | Fill #0

## 2019-03-08 NOTE — Telephone Encounter (Signed)
Fioricet refill e-sent

## 2019-03-08 NOTE — Telephone Encounter (Signed)
Dr Annamaria Boots, This request was received this morning.  Would it be possible to get a prescription refill for the Acetaminophen / Butalbital / Caffeine? My prescription has expired. If you can prescribe it for me could you send it electronically to the [facility]? Thank you, Mindy [name]   Last prescription was 07/27/18, #75, with 5 refills Patient's LOV- 07/27/18   Allergies  Allergen Reactions  . Oxaprozin Hives and Itching    "daypro"   Current Outpatient Medications on File Prior to Visit  Medication Sig Dispense Refill  . butalbital-acetaminophen-caffeine (FIORICET, ESGIC) 50-325-40 MG tablet Take 1 tablet by mouth every 6 (six) hours as needed for headache. 75 tablet 5  . Dexlansoprazole 30 MG capsule Take 1 capsule (30 mg total) by mouth daily. 90 capsule 3  . DYMISTA 137-50 MCG/ACT SUSP Place 2 sprays into both nostrils at bedtime. 23 g 11  . eletriptan (RELPAX) 40 MG tablet TAKE 1 TABLET BY MOUTH AS NEEDED FOR MIGRAINE OR HEADACHE. MAY REPEAT IN 2 HOURS IF HEADACHE PERSISTS OR RECURS 10 tablet 11  . estrogens, conjugated, (PREMARIN) 0.9 MG tablet Take 0.9 mg by mouth daily.     Marland Kitchen gabapentin (NEURONTIN) 300 MG capsule Take 1 capsule (300 mg total) by mouth at bedtime. 30 capsule 2  . hydrocortisone (ANUSOL-HC) 2.5 % rectal cream Place 1 application rectally 2 (two) times daily. 30 g 11  . LINZESS 72 MCG capsule TAKE 1 CAPSULE BY MOUTH DAILY. 180 capsule 3  . predniSONE (DELTASONE) 20 MG tablet Take 1 tablet (20 mg total) by mouth 2 (two) times daily. 14 tablet 0  . pseudoephedrine (SUDAFED) 30 MG tablet Per box as needed    . sodium chloride (OCEAN) 0.65 % nasal spray Place 1 spray into the nose 4 (four) times daily as needed for congestion (and dryness).    . traMADol (ULTRAM) 50 MG tablet Take 1 tablet (50 mg total) by mouth 3 (three) times daily. 21 tablet 0   No current facility-administered medications on file prior to visit.     Message routed to Dr Annamaria Boots

## 2019-03-16 MED FILL — ELETRIPTAN HBR 40 MG TABLET: 40 | 30 days supply | Qty: 10 | Fill #1

## 2019-03-16 MED FILL — LINZESS 72 MCG CAPSULE: 72 | 30 days supply | Qty: 30 | Fill #0

## 2019-04-09 ENCOUNTER — Ambulatory Visit: Payer: 59 | Admitting: Internal Medicine

## 2019-04-19 MED FILL — AZELASTINE-FLUTICASONE 137-: 137-50 | 30 days supply | Qty: 23 | Fill #0

## 2019-04-19 MED FILL — BUTALBITAL-APAP-CAFFEINE 50: 50-325-40 | 18 days supply | Qty: 75 | Fill #1

## 2019-04-19 MED FILL — ELETRIPTAN HBR 40 MG TABLET: 40 | 30 days supply | Qty: 10 | Fill #2

## 2019-04-20 ENCOUNTER — Encounter: Payer: Self-pay | Admitting: Internal Medicine

## 2019-05-11 MED FILL — LINZESS 72 MCG CAPSULE: 72 | 30 days supply | Qty: 30 | Fill #1

## 2019-05-21 MED FILL — BUTALBITAL-APAP-CAFFEINE 50: 50-325-40 | 18 days supply | Qty: 75 | Fill #2

## 2019-05-21 MED FILL — ELETRIPTAN HBR 40 MG TABLET: 40 | 30 days supply | Qty: 10 | Fill #3

## 2019-05-22 ENCOUNTER — Telehealth: Payer: 59 | Admitting: Physician Assistant

## 2019-05-22 DIAGNOSIS — G43719 Chronic migraine without aura, intractable, without status migrainosus: Secondary | ICD-10-CM

## 2019-05-22 NOTE — Progress Notes (Signed)
Based on what you shared with me, I feel your condition warrants further evaluation and I recommend that you be seen for a face to face office visit.  We are unable to treat migraines and associated symptoms via e-visit, especially ones that are not resolving with your regular therapies. You will need to be seen today, either at your PCP's Saturday clinic (call the main office number to schedule), and Urgent Care or at one of our [facility] locations. This way you can be given a medication in-office to help abort the headache, etc.   NOTE: If you entered your credit card information for this eVisit, you will not be charged. You may see a "hold" on your card for the $35 but that hold will drop off and you will not have a charge processed.  If you are having a true medical emergency please call 911.     For an urgent face to face visit, [facility] has five urgent care centers for your convenience:    [email or web address] to reserve your spot online an avoid wait times  [facility] (New Address!) [address], [place], [place] [address] *Just off [address], across the road from [place] hours of operation: Monday-Friday, 12 PM to 6 PM  Closed Saturday & Sunday   The following sites will take your insurance:  . [facility]    [phone]                  Get Driving Directions  [address], [place] [address] . 10 am to 8 pm Monday-Friday . 12 pm to 8 pm Saturday-Sunday   . [facility] at [place]                  Get Driving Directions  [address], [place], [place] [address] . 8 am to 8 pm Monday-Friday . 9 am to 6 pm Saturday . 11 am to 6 pm Sunday   . [facility] at [place]                  Get Driving Directions   [address], [place] [address] . 8 am to 8 pm  Monday-Friday . 8 am to 4 pm Saturday-Sunday    . [facility] at [place]                    Get Driving Directions  [phone]  [address] [place], [place] [address]  . Monday-Friday, 12 PM to 6 PM    Your e-visit answers were reviewed by a board certified advanced clinical practitioner to complete your personal care plan.  Thank you for using e-Visits.

## 2019-06-01 ENCOUNTER — Encounter: Payer: Self-pay | Admitting: Internal Medicine

## 2019-06-02 MED ORDER — PANTOPRAZOLE SODIUM 40 MG PO TBEC: 40.0000 mg | DELAYED_RELEASE_TABLET | Freq: Every day | ORAL | 1 refills | Status: DC

## 2019-06-03 MED FILL — PANTOPRAZOLE SOD DR 40 MG T: 40 | 90 days supply | Qty: 90 | Fill #0

## 2019-06-12 MED FILL — DEXILANT DR 30 MG CAPSULE: 30 | 90 days supply | Qty: 90 | Fill #1

## 2019-06-23 MED FILL — BUTALBITAL-APAP-CAFFEINE 50: 50-325-40 | 18 days supply | Qty: 75 | Fill #3

## 2019-06-23 MED FILL — ELETRIPTAN HBR 40 MG TABLET: 40 | 30 days supply | Qty: 10 | Fill #4

## 2019-07-04 MED FILL — LINZESS 72 MCG CAPSULE: 72 | 30 days supply | Qty: 30 | Fill #2

## 2019-07-21 ENCOUNTER — Other Ambulatory Visit (HOSPITAL_BASED_OUTPATIENT_CLINIC_OR_DEPARTMENT_OTHER): Payer: Self-pay | Admitting: Obstetrics and Gynecology

## 2019-07-21 DIAGNOSIS — Z1231 Encounter for screening mammogram for malignant neoplasm of breast: Secondary | ICD-10-CM

## 2019-07-29 ENCOUNTER — Other Ambulatory Visit: Payer: Self-pay

## 2019-07-29 ENCOUNTER — Ambulatory Visit: Payer: 59 | Admitting: Internal Medicine

## 2019-07-29 ENCOUNTER — Encounter: Payer: Self-pay | Admitting: Internal Medicine

## 2019-07-29 DIAGNOSIS — G44219 Episodic tension-type headache, not intractable: Secondary | ICD-10-CM

## 2019-07-29 DIAGNOSIS — J300 Vasomotor rhinitis: Secondary | ICD-10-CM | POA: Diagnosis not present

## 2019-07-29 DIAGNOSIS — Z23 Encounter for immunization: Secondary | ICD-10-CM

## 2019-07-29 MED ORDER — BUTALBITAL-APAP-CAFFEINE 50-325-40 MG PO TABS: 1.0000 | ORAL_TABLET | Freq: Four times a day (QID) | ORAL | 5 refills | Status: DC | PRN

## 2019-07-29 MED ORDER — DYMISTA 137-50 MCG/ACT NA SUSP: 2.0000 | Freq: Every day | NASAL | 11 refills | Status: DC

## 2019-07-29 MED FILL — BUTALBITAL-APAP-CAFFEINE 50: 50-325-40 | 18 days supply | Qty: 75 | Fill #4

## 2019-07-29 NOTE — Assessment & Plan Note (Signed)
Overall pattern is stable, with some variation over time. Discussed changing position etc and she does this to manage postural and eye strain. Plan- refill Fioricet.

## 2019-07-29 NOTE — Assessment & Plan Note (Signed)
Manages symptoms.  Pllan- Dymista refill

## 2019-07-29 NOTE — Progress Notes (Signed)
Patient ID: Mindy [name], female    DOB: July 22, 1961, 58 y.o.   MRN: [id number]  HPI female never smoker followed for chronic rhinitis/sinusitis, complicated by headache Allergy profile 12/27/13- Neg, Total IgE 4.5 Had failed allergy vaccine Eos not elevated 09/12/14 Sinus xray 12/12/2015-no evidence of acute sinusitis, clear -------------------------------------------------------------------  07/27/2018- 58 year old female never smoker followed for chronic rhinitis/sinusitis, complicated by headache, breast cancer -----reports some sinus congestion with clear drainage, PND, HA, throat tickle and cough with the recent weather change She is noticing seasonal exacerbation of her chronic sinus pressure/drainage/headache pattern.  We discussed previous treatment and ENT evaluation.  Denies purulent discharge or fever. Chest feels clear without wheeze or cough.  07/29/2019- 58 year old female never smoker followed for chronic rhinitis/sinusitis, complicated by Migraine, superficial thrombophlebitis, Cervical disc disease, GERD,  Fioricet, sudafed,  ------Rhinitis; pt states her symptoms are at baseline; would like flu shot today Frequent headaches- migraine, sinus congestion, and tension. Recognizes some are eyestrain-induced, from prolonged screen time working from home.  Uses Excedrin, then Fioricet, then Relpax, based on need. No recent infection or seasonal symptom flare so far. Denies wheeze or cough.  Review of Systems- see HPI   +  = positive Constitutional:   No-   weight loss, night sweats, fevers, chills, fatigue, lassitude. HEENT:   +  headaches,  No-difficulty swallowing, tooth/dental problems, sore throat,       sneezing, itching, ear ache, +nasal congestion, post nasal drip,  CV:  chest pain, orthopnea, PND, swelling in lower extremities, anasarca, dizziness, palpitations Resp: shortness of breath with exertion or at rest.              No-   productive cough,  non-productive  cough,  No-  coughing up of blood.              No-   change in color of mucus.  No- wheezing.   Skin: No-   rash or lesions. GI:  No-   heartburn, indigestion, abdominal pain, nausea, vomiting,  GU:  MS:  No-   joint pain or swelling.   Neuro- nothing unusual Psych:  No- change in mood or affect. No depression or anxiety.  No memory loss.  Objective:   Physical Exam General- Alert, Oriented, Affect-appropriate, Distress- none, + Looks comfortable today Skin- rash-none, lesions- none, excoriation- none Lymphadenopathy- none Head- atraumatic            Eyes- Gross vision intact, PERRLA, conjunctivae clear secretions            Ears- Hearing, canals            Nose- , +turbinate edema, +Septal dev, no-mucus,  No-polyps, erosion, perforation.     Masked            Throat- Mallampati III-IV , mucosa clear , drainage- none, tonsils- atrophic Neck- flexible , trachea midline, no stridor , thyroid nl, carotid no bruit Chest - symmetrical excursion , unlabored           Heart/CV- RRR , no murmur , no gallop  , no rub, nl s1 s2                           - JVD- none , edema- none, stasis changes- none, varices- none           Lung-  wheeze -none, cough-none , dullness-none, rub- none           Chest wall-  Abd-  Br/ Gen/ Rectal- Not done, not indicated Extrem- cyanosis- none, clubbing, none, atrophy- none, strength- nl Neuro- grossly intact to observation

## 2019-07-29 NOTE — Patient Instructions (Signed)
Refills sent  Order- Flu vax standard  Please call if I can help

## 2019-08-03 ENCOUNTER — Telehealth: Payer: Self-pay | Admitting: Internal Medicine

## 2019-08-03 MED FILL — ELETRIPTAN HBR 40 MG TABLET: 40 | 30 days supply | Qty: 10 | Fill #5

## 2019-08-03 NOTE — Telephone Encounter (Signed)
Medication name and strength: Dymista nasal spray Provider: Dr. Annamaria Boots Pharmacy: [facility] Patient insurance ID:  Phone: [phone] Fax: [phone]  Was the PA started on CMM?  Yes If yes, please enter the Key: [id number] Timeframe for approval/denial: 1-3 days

## 2019-08-06 DIAGNOSIS — Z01419 Encounter for gynecological examination (general) (routine) without abnormal findings: Secondary | ICD-10-CM | POA: Diagnosis not present

## 2019-08-06 MED FILL — PREMARIN 0.625 MG TABLET: 0.625 | 90 days supply | Qty: 90 | Fill #0

## 2019-08-10 ENCOUNTER — Encounter: Payer: Self-pay | Admitting: *Deleted

## 2019-08-10 NOTE — Telephone Encounter (Signed)
Letter created,  Dr. Annamaria Boots signed. Will fax to [phone]  [employer] appeal

## 2019-08-10 NOTE — Telephone Encounter (Signed)
Yes, thanks very much

## 2019-08-10 NOTE — Telephone Encounter (Signed)
Received a denial response from [employer]. We can submit an appeal to show that she has tried and failed the following: Neo-Synephrine 1% nasal spray, sodium chloride 0.65% nasal spray, Flonase and Nasacort nasal inhaler. We will need to submit a letter to [place], fax [phone].   Dr. Annamaria Boots, are you ok with [place] sending a letter to [employer] to get the [place] approved? Please advise. Thanks!

## 2019-08-25 NOTE — Telephone Encounter (Signed)
Dr Annamaria Boots, please advise on pt email, thanks!  Since the Dymista was not approved by our insurance is there something else that you would recommend instead? Thank you, Mindy [name]

## 2019-08-26 MED ORDER — AZELASTINE HCL 0.1 % NA SOLN: 1.0000 | Freq: Two times a day (BID) | NASAL | 99 refills | Status: DC

## 2019-08-26 MED FILL — AZELASTINE HCL 137 MCG SPRY: 0.1 | 25 days supply | Qty: 30 | Fill #0

## 2019-08-26 MED FILL — LINZESS 72 MCG CAPSULE: 72 | 30 days supply | Qty: 30 | Fill #3

## 2019-08-26 NOTE — Addendum Note (Signed)
Addended by: Desmond Dike C on: 08/26/2019 09:52 AM   Modules accepted: Orders

## 2019-08-26 NOTE — Telephone Encounter (Signed)
Responded to patient's message and let her know what Dr. Annamaria Boots recommended.  Asked if she would like Rx sent in and if so to what pharmacy.

## 2019-08-26 NOTE — Telephone Encounter (Signed)
Dymista is a combination of Flonase, which is available otc without prescription, and Astelin, which is a prescription antihistamine nasal spray available as generic. What many people do is use 2 puffs of Flonase in each nostril once daily, and 1-2 puffs of Astelin in each nostril once or twice daily.  If Mindy [name] would like to try that, we can send Rx for Astelin nasal spray, # 1 vial,  1-2 puffs each nostril twice daily, refill prn.

## 2019-08-31 MED FILL — BUTALB-ACETAMIN-CAFF 50-325: 50-325-40 | 18 days supply | Qty: 75 | Fill #5

## 2019-09-06 ENCOUNTER — Ambulatory Visit (HOSPITAL_BASED_OUTPATIENT_CLINIC_OR_DEPARTMENT_OTHER)
Admission: RE | Admit: 2019-09-06 | Discharge: 2019-09-06 | Disposition: A | Discharge status: Home / Self Care | Payer: 59 | Source: Ambulatory Visit | Attending: Obstetrics and Gynecology | Admitting: Obstetrics and Gynecology

## 2019-09-06 ENCOUNTER — Encounter (HOSPITAL_BASED_OUTPATIENT_CLINIC_OR_DEPARTMENT_OTHER): Payer: Self-pay

## 2019-09-06 DIAGNOSIS — Z1231 Encounter for screening mammogram for malignant neoplasm of breast: Secondary | ICD-10-CM | POA: Diagnosis not present

## 2019-09-08 NOTE — Progress Notes (Addendum)
Subjective:    Patient ID: Mindy [name], female    DOB: [birth year], 58 y.o.   MRN: [id number]  HPI She is here for a physical exam.   Her headaches have increased.  She has migraines and headaches.  Some are probably from being on the computer all day, some may be from neck pain and some migraines.   Left elbow pain intermittent with pressure on the elbow or flexing it.  There is a little bump there.     Medications and allergies reviewed with patient and updated if appropriate.  Patient Active Problem List   Diagnosis Date Noted  . Cervical disc disorder with radiculopathy of cervical region 10/12/2018  . Hyperlipidemia 09/07/2018  . Hip injury, right, initial encounter 07/03/2018  . Right hip pain 05/07/2018  . Sleep difficulties 06/06/2017  . Constipation 06/06/2017  . Insomnia 04/03/2017  . 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 Low back pain 02/13/2017  . Right sided sciatica 02/13/2017  . RLQ abdominal pain 08/20/2016  . Anal itching 08/20/2016  . GERD (gastroesophageal reflux disease) 06/05/2016  . Migraine 12/27/2015  . Headache 12/27/2015  . Lateral epicondylitis of left elbow 06/27/2015  . Cervical radiculitis 06/27/2015  . 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 azelastine (ASTELIN) 0.1 % nasal spray Place 1-2 sprays into both nostrils 2 (two) times daily. Use in each nostril as directed 30 mL prn  . butalbital-acetaminophen-caffeine (FIORICET) 50-325-40 MG tablet Take 1 tablet by mouth every 6 (six) hours as needed for headache. 75 tablet 5  . DYMISTA 137-50 MCG/ACT SUSP Place 2 sprays into both nostrils at bedtime. 23 g 11  . eletriptan (RELPAX) 40 MG tablet TAKE 1 TABLET BY MOUTH AS NEEDED FOR MIGRAINE OR HEADACHE. MAY REPEAT IN 2 HOURS IF HEADACHE PERSISTS OR RECURS 10 tablet 11  . estrogens, conjugated, (PREMARIN) 0.625 MG  tablet Premarin 0.625 mg tablet  Take 1 tablet(s) every day by oral route for 30 days.    . hydrocortisone (ANUSOL-HC) 2.5 % rectal cream Place 1 application rectally 2 (two) times daily. 30 g 11  . LINZESS 72 MCG capsule TAKE 1 CAPSULE BY MOUTH DAILY. 180 capsule 3  . pantoprazole (PROTONIX) 40 MG tablet Take 1 tablet (40 mg total) by mouth daily. 90 tablet 1  . pseudoephedrine (SUDAFED) 30 MG tablet Per box as needed    . sodium chloride (OCEAN) 0.65 % nasal spray Place 1 spray into the nose 4 (four) times daily as needed for congestion (and dryness).     No current facility-administered medications on file prior to visit.     Past Medical History:  Diagnosis Date  . Allergic rhinitis   . Complication of anesthesia   . Elevated transaminase level 12.2014   ?etiology - present 09/2013 hosp for epigastric pain  . Family history of adverse reaction to anesthesia    father - PONV  . GERD (gastroesophageal reflux disease)   . History of bronchitis 10/2014  . Migraine   . PONV (postoperative nausea and vomiting)   . Seasonal allergies     Past Surgical History:  Procedure Laterality Date  . BREAST BIOPSY Right   . BREAST EXCISIONAL BIOPSY    . BREAST LUMPECTOMY WITH RADIOACTIVE SEED LOCALIZATION Right 10/08/2016   Procedure: RIGHT BREAST LUMPECTOMY WITH RADIOACTIVE SEED LOCALIZATION;  Surgeon: Coralie Keens, MD;  Location: [facility];  Service:  General;  Laterality: Right;  . CHOLECYSTECTOMY N/A 09/21/2015   Procedure: LAPAROSCOPIC CHOLECYSTECTOMY;  Surgeon: Coralie Keens, MD;  Location: [facility];  Service: General;  Laterality: N/A;  . COLONOSCOPY  12/2013  . NECK SURGERY  2009  . PARTIAL HYSTERECTOMY  2007  . UPPER GI ENDOSCOPY  2016    Social History   Socioeconomic History  . Marital status: Divorced    Spouse name: Not on file  . Number of children: Not on file  . Years of education: Not on file  . Highest education level: Not on file  Occupational History  .  Occupation: Therapist, sports  Social Needs  . Financial resource strain: Not on file  . Food insecurity    Worry: Not on file    Inability: Not on file  . Transportation needs    Medical: Not on file    Non-medical: Not on file  Tobacco Use  . Smoking status: Never Smoker  . Smokeless tobacco: Never Used  Substance and Sexual Activity  . Alcohol use: Yes    Comment: occ  . Drug use: No  . Sexual activity: Not on file  Lifestyle  . Physical activity    Days per week: Not on file    Minutes per session: Not on file  . Stress: Not on file  Relationships  . Social Herbalist on phone: Not on file    Gets together: Not on file    Attends religious service: Not on file    Active member of club or organization: Not on file    Attends meetings of clubs or organizations: Not on file    Relationship status: Not on file  Other Topics Concern  . Not on file  Social History Narrative   Exercise: on average 3/week - running, weights sometimes    Family History  Problem Relation Age of Onset  . Atrial fibrillation Father   . Diabetes type II Father   . Hypertension Father   . Coronary artery disease Father 26       stent x 2, AoVR  . Hypertension Mother   . Dementia Mother   . Hypertension Brother   . Breast cancer Maternal Aunt   . Breast cancer Maternal Grandmother   . Breast cancer Paternal Grandmother     Review of Systems  Constitutional: Negative for chills and fever.  Eyes: Negative for visual disturbance.  Respiratory: Negative for cough, shortness of breath and wheezing.   Cardiovascular: Positive for chest pain (lower chest with gerd on occasion). Negative for palpitations and leg swelling.  Gastrointestinal: Negative for abdominal pain, blood in stool, constipation, diarrhea and nausea.       GERD controlled  Genitourinary: Negative for dysuria and hematuria.  Musculoskeletal: Positive for arthralgias (left elbow) and neck pain.  Skin: Positive for rash. Negative  for color change.  Neurological: Positive for headaches. Negative for dizziness and light-headedness.  Psychiatric/Behavioral: Negative for dysphoric mood. The patient is not nervous/anxious.        Objective:   Vitals:   09/09/19 0743  BP: 124/78  Pulse: 82  Temp: 97.9 F (36.6 C)  SpO2: 96%   Filed Weights   09/09/19 0743  Weight: 139 lb 12.8 oz (63.4 kg)   Body mass index is 24.19 kg/m.  BP Readings from Last 3 Encounters:  09/09/19 124/78  07/29/19 120/70  11/19/18 130/80    Wt Readings from Last 3 Encounters:  09/09/19 139 lb 12.8 oz (63.4 kg)  07/29/19 143 lb 9.6 oz (65.1 kg)  11/19/18 143 lb (64.9 kg)     Physical Exam Constitutional: She appears well-developed and well-nourished. No distress.  HENT:  Head: Normocephalic and atraumatic.  Right Ear: External ear normal. Normal ear canal and TM Left Ear: External ear normal.  Normal ear canal and TM Mouth/Throat: Oropharynx is clear and moist.  Eyes: Conjunctivae and EOM are normal.  Neck: Neck supple. No tracheal deviation present. No thyromegaly present.  No carotid bruit  Cardiovascular: Normal rate, regular rhythm and normal heart sounds.   No murmur heard.  No edema. Pulmonary/Chest: Effort normal and breath sounds normal. No respiratory distress. She has no wheezes. She has no rales.  Breast: deferred   Abdominal: Soft. She exhibits no distension. There is no tenderness.  Lymphadenopathy: She has no cervical adenopathy.  Skin: Skin is warm and dry. She is not diaphoretic.  Psychiatric: She has a normal mood and affect. Her behavior is normal.        Assessment & Plan:      Physical exam: Screening blood work    ordered Immunizations  Discussed shingrix, td up to date at work Colonoscopy  Up to date  Mammogram  Up to date  Gyn  Up to date  Eye exams  Up to date  Exercise   irregular Weight  Normal BMI Substance abuse  none  See Problem List for Assessment and Plan of chronic medical  problems.   FU in one year   This visit occurred during the SARS-CoV-2 public health emergency.  Safety protocols were in place, including screening questions prior to the visit, additional usage of staff PPE, and extensive cleaning of exam room while observing appropriate contact time as indicated for disinfecting solutions.

## 2019-09-08 NOTE — Patient Instructions (Addendum)
Tests ordered today. Your results will be released to [facility] [place] (or called to you) after review.  If any changes need to be made, you will be notified at that same time.  All other Health Maintenance issues reviewed.   All recommended immunizations and age-appropriate screenings are up-to-date or discussed.  No immunization administered today.   Medications reviewed and updated.  Changes include :   Methocarbamol for muscle tightness/spasms.  Nortriptyline at night for migraines.   Your prescription(s) have been submitted to your pharmacy. Please take as directed and contact our office if you believe you are having problem(s) with the medication(s).    Please followup in 1 year     Health Maintenance, Female Adopting a healthy lifestyle and getting preventive care are important in promoting health and wellness. Ask your health care provider about:  The right schedule for you to have regular tests and exams.  Things you can do on your own to prevent diseases and keep yourself healthy. What should I know about diet, weight, and exercise? Eat a healthy diet   Eat a diet that includes plenty of vegetables, fruits, low-fat dairy products, and lean protein.  Do not eat a lot of foods that are high in solid fats, added sugars, or sodium. Maintain a healthy weight Body mass index (BMI) is used to identify weight problems. It estimates body fat based on height and weight. Your health care provider can help determine your BMI and help you achieve or maintain a healthy weight. Get regular exercise Get regular exercise. This is one of the most important things you can do for your health. Most adults should:  Exercise for at least 150 minutes each week. The exercise should increase your heart rate and make you sweat (moderate-intensity exercise).  Do strengthening exercises at least twice a week. This is in addition to the moderate-intensity exercise.  Spend less time sitting. Even light  physical activity can be beneficial. Watch cholesterol and blood lipids Have your blood tested for lipids and cholesterol at 56 years of age, then have this test every 5 years. Have your cholesterol levels checked more often if:  Your lipid or cholesterol levels are high.  You are older than 56 years of age.  You are at high risk for heart disease. What should I know about cancer screening? Depending on your health history and family history, you may need to have cancer screening at various ages. This may include screening for:  Breast cancer.  Cervical cancer.  Colorectal cancer.  Skin cancer.  Lung cancer. What should I know about heart disease, diabetes, and high blood pressure? Blood pressure and heart disease  High blood pressure causes heart disease and increases the risk of stroke. This is more likely to develop in people who have high blood pressure readings, are of African descent, or are overweight.  Have your blood pressure checked: ? Every 3-5 years if you are 70-65 years of age. ? Every year if you are 30 years old or older. Diabetes Have regular diabetes screenings. This checks your fasting blood sugar level. Have the screening done:  Once every three years after age 2 if you are at a normal weight and have a low risk for diabetes.  More often and at a younger age if you are overweight or have a high risk for diabetes. What should I know about preventing infection? Hepatitis B If you have a higher risk for hepatitis B, you should be screened for this virus. Talk with  your health care provider to find out if you are at risk for hepatitis B infection. Hepatitis C Testing is recommended for:  Everyone born from [birth year] through 1963.  Anyone with known risk factors for hepatitis C. Sexually transmitted infections (STIs)  Get screened for STIs, including gonorrhea and chlamydia, if: ? You are sexually active and are younger than 56 years of age. ? You are older  than 56 years of age and your health care provider tells you that you are at risk for this type of infection. ? Your sexual activity has changed since you were last screened, and you are at increased risk for chlamydia or gonorrhea. Ask your health care provider if you are at risk.  Ask your health care provider about whether you are at high risk for HIV. Your health care provider may recommend a prescription medicine to help prevent HIV infection. If you choose to take medicine to prevent HIV, you should first get tested for HIV. You should then be tested every 3 months for as long as you are taking the medicine. Pregnancy  If you are about to stop having your period (premenopausal) and you may become pregnant, seek counseling before you get pregnant.  Take 400 to 800 micrograms (mcg) of folic acid every day if you become pregnant.  Ask for birth control (contraception) if you want to prevent pregnancy. Osteoporosis and menopause Osteoporosis is a disease in which the bones lose minerals and strength with aging. This can result in bone fractures. If you are 48 years old or older, or if you are at risk for osteoporosis and fractures, ask your health care provider if you should:  Be screened for bone loss.  Take a calcium or vitamin D supplement to lower your risk of fractures.  Be given hormone replacement therapy (HRT) to treat symptoms of menopause. Follow these instructions at home: Lifestyle  Do not use any products that contain nicotine or tobacco, such as cigarettes, e-cigarettes, and chewing tobacco. If you need help quitting, ask your health care provider.  Do not use street drugs.  Do not share needles.  Ask your health care provider for help if you need support or information about quitting drugs. Alcohol use  Do not drink alcohol if: ? Your health care provider tells you not to drink. ? You are pregnant, may be pregnant, or are planning to become pregnant.  If you drink  alcohol: ? Limit how much you use to 0-1 drink a day. ? Limit intake if you are breastfeeding.  Be aware of how much alcohol is in your drink. In the [place] one drink equals one 12 oz bottle of beer (355 mL), one 5 oz glass of wine (148 mL), or one 1 oz glass of hard liquor (44 mL). General instructions  Schedule regular health, dental, and eye exams.  Stay current with your vaccines.  Tell your health care provider if: ? You often feel depressed. ? You have ever been abused or do not feel safe at home. Summary  Adopting a healthy lifestyle and getting preventive care are important in promoting health and wellness.  Follow your health care provider's instructions about healthy diet, exercising, and getting tested or screened for diseases.  Follow your health care provider's instructions on monitoring your cholesterol and blood pressure. This information is not intended to replace advice given to you by your health care provider. Make sure you discuss any questions you have with your health care provider. Document Released: 04/22/2011 Document  Revised: 09/30/2018 Document Reviewed: 09/30/2018 Elsevier Patient Education  [place].

## 2019-09-09 ENCOUNTER — Other Ambulatory Visit (INDEPENDENT_AMBULATORY_CARE_PROVIDER_SITE_OTHER): Payer: 59

## 2019-09-09 ENCOUNTER — Encounter: Payer: Self-pay | Admitting: Internal Medicine

## 2019-09-09 ENCOUNTER — Other Ambulatory Visit: Payer: Self-pay

## 2019-09-09 ENCOUNTER — Ambulatory Visit (INDEPENDENT_AMBULATORY_CARE_PROVIDER_SITE_OTHER): Payer: 59 | Admitting: Internal Medicine

## 2019-09-09 VITALS — BP 124/78 | HR 82 | Temp 97.9°F | Ht 63.75 in | Wt 139.8 lb

## 2019-09-09 DIAGNOSIS — G43719 Chronic migraine without aura, intractable, without status migrainosus: Secondary | ICD-10-CM

## 2019-09-09 DIAGNOSIS — K219 Gastro-esophageal reflux disease without esophagitis: Secondary | ICD-10-CM

## 2019-09-09 DIAGNOSIS — Z0000 Encounter for general adult medical examination without abnormal findings: Secondary | ICD-10-CM | POA: Diagnosis not present

## 2019-09-09 LAB — COMPREHENSIVE METABOLIC PANEL
ALT: 15 U/L (ref 0–35)
AST: 14 U/L (ref 0–37)
Albumin: 4.5 g/dL (ref 3.5–5.2)
Alkaline Phosphatase: 83 U/L (ref 39–117)
BUN: 7 mg/dL (ref 6–23)
CO2: 30 mEq/L (ref 19–32)
Calcium: 9.6 mg/dL (ref 8.4–10.5)
Chloride: 103 mEq/L (ref 96–112)
Creatinine, Ser: 0.98 mg/dL (ref 0.40–1.20)
GFR: 58.65 mL/min — ABNORMAL LOW (ref >60.00)
Glucose, Bld: 97 mg/dL (ref 70–99)
Potassium: 4.5 mEq/L (ref 3.5–5.1)
Sodium: 142 mEq/L (ref 135–145)
Total Bilirubin: 0.5 mg/dL (ref 0.2–1.2)
Total Protein: 7.1 g/dL (ref 6.0–8.3)

## 2019-09-09 LAB — CBC WITH DIFFERENTIAL/PLATELET
Basophils Absolute: 0 10*3/uL (ref 0.0–0.1)
Basophils Relative: 0.7 % (ref 0.0–3.0)
Eosinophils Absolute: 0.1 10*3/uL (ref 0.0–0.7)
Eosinophils Relative: 1.5 % (ref 0.0–5.0)
HCT: 42.9 % (ref 36.0–46.0)
Hemoglobin: 14.9 g/dL (ref 12.0–15.0)
Lymphocytes Relative: 36 % (ref 12.0–46.0)
Lymphs Abs: 1.5 10*3/uL (ref 0.7–4.0)
MCHC: 34.7 g/dL (ref 30.0–36.0)
MCV: 95.9 fl (ref 78.0–100.0)
Monocytes Absolute: 0.4 10*3/uL (ref 0.1–1.0)
Monocytes Relative: 10.6 % (ref 3.0–12.0)
Neutro Abs: 2.1 10*3/uL (ref 1.4–7.7)
Neutrophils Relative %: 51.2 % (ref 43.0–77.0)
Platelets: 294 10*3/uL (ref 150.0–400.0)
RBC: 4.48 Mil/uL (ref 3.87–5.11)
RDW: 12.6 % (ref 11.5–15.5)
WBC: 4.1 10*3/uL (ref 4.0–10.5)

## 2019-09-09 LAB — LIPID PANEL
Cholesterol: 249 mg/dL — ABNORMAL HIGH (ref 0–200)
HDL: 72 mg/dL (ref >39.00)
LDL Cholesterol: 153 mg/dL — ABNORMAL HIGH (ref 0–99)
NonHDL: 177.4
Total CHOL/HDL Ratio: 3
Triglycerides: 120 mg/dL (ref 0.0–149.0)
VLDL: 24 mg/dL (ref 0.0–40.0)

## 2019-09-09 LAB — TSH: TSH: 2.93 u[IU]/mL (ref 0.35–4.50)

## 2019-09-09 MED ORDER — METHOCARBAMOL 500 MG PO TABS: 500.0000 mg | ORAL_TABLET | Freq: Four times a day (QID) | ORAL | 2 refills | Status: DC | PRN

## 2019-09-09 MED ORDER — NORTRIPTYLINE HCL 10 MG PO CAPS: 10.0000 mg | ORAL_CAPSULE | Freq: Every day | ORAL | 5 refills | Status: DC

## 2019-09-09 MED FILL — METHOCARBAMOL 500 MG TABS: 500 | 15 days supply | Qty: 60 | Fill #0

## 2019-09-09 MED FILL — NORTRIPTYLINE HCL 10 MG CAP: 10 | 30 days supply | Qty: 60 | Fill #0

## 2019-09-09 NOTE — Assessment & Plan Note (Signed)
GERD controlled Continue daily medication  

## 2019-09-09 NOTE — Assessment & Plan Note (Addendum)
Increase headaches overall - migraines and other. Multifactorial - neck pain, stress, computers, migraines Taking fioricet prn - first medication she uses Occasionally take aleve Taking relpax prn She is taking medication almost every day Methocarbamol prn for neck tightness/pain Trial of nortriptyline

## 2019-09-10 ENCOUNTER — Encounter: Payer: Self-pay | Admitting: Internal Medicine

## 2019-09-19 ENCOUNTER — Encounter: Payer: Self-pay | Admitting: Internal Medicine

## 2019-09-20 MED ORDER — HYDROCORTISONE ACE-PRAMOXINE 2.5-1 % EX CREA: TOPICAL_CREAM | CUTANEOUS | 2 refills | Status: DC

## 2019-09-27 ENCOUNTER — Other Ambulatory Visit: Payer: Self-pay | Admitting: Internal Medicine

## 2019-09-27 MED FILL — ELETRIPTAN HBR 40 MG TABLET: 40 | 25 days supply | Qty: 10 | Fill #0

## 2019-09-27 MED FILL — LINZESS 72 MCG CAPSULE: 72 | 30 days supply | Qty: 30 | Fill #4

## 2019-09-30 ENCOUNTER — Other Ambulatory Visit: Payer: Self-pay | Admitting: Internal Medicine

## 2019-09-30 ENCOUNTER — Encounter: Payer: Self-pay | Admitting: Internal Medicine

## 2019-09-30 MED ORDER — DEXILANT 60 MG PO CPDR: 60.0000 mg | DELAYED_RELEASE_CAPSULE | Freq: Every day | ORAL | 1 refills | Status: DC

## 2019-09-30 MED FILL — DEXILANT DR 30 MG CAPSULE: 30 | 90 days supply | Qty: 90 | Fill #0

## 2019-09-30 MED FILL — BUTALB-ACETAMIN-CAFF 50-325: 50-325-40 | 18 days supply | Qty: 75 | Fill #0

## 2019-09-30 MED FILL — HYDROCORTISONE-PRAMOXINE CR: 2.5-1 | 10 days supply | Qty: 28 | Fill #0

## 2019-10-19 ENCOUNTER — Encounter: Payer: Self-pay | Admitting: Internal Medicine

## 2019-10-19 ENCOUNTER — Other Ambulatory Visit: Payer: Self-pay | Admitting: Internal Medicine

## 2019-10-19 DIAGNOSIS — K5904 Chronic idiopathic constipation: Secondary | ICD-10-CM

## 2019-10-19 MED ORDER — LINACLOTIDE 145 MCG PO CAPS: 145.0000 ug | ORAL_CAPSULE | Freq: Every day | ORAL | 0 refills | Status: DC

## 2019-10-19 MED FILL — LINZESS 145 MCG CAPSULE: 145 | 90 days supply | Qty: 90 | Fill #0 | Status: TO

## 2019-10-19 MED FILL — LINZESS 145 MCG CAPSULE: 145 | 90 days supply | Qty: 90 | Fill #0

## 2019-11-03 MED FILL — ELETRIPTAN HBR 40 MG TABLET: 40 | 25 days supply | Qty: 10 | Fill #1

## 2019-11-03 MED FILL — BUTALBITAL-APAP-CAFFEINE 50: 50-325-40 | 18 days supply | Qty: 75 | Fill #1

## 2019-11-03 MED FILL — METHOCARBAMOL 500 MG TABS: 500 | 15 days supply | Qty: 60 | Fill #1

## 2019-11-05 DIAGNOSIS — H52223 Regular astigmatism, bilateral: Secondary | ICD-10-CM | POA: Diagnosis not present

## 2019-11-05 DIAGNOSIS — H5203 Hypermetropia, bilateral: Secondary | ICD-10-CM | POA: Diagnosis not present

## 2019-11-05 DIAGNOSIS — H524 Presbyopia: Secondary | ICD-10-CM | POA: Diagnosis not present

## 2019-11-19 MED FILL — PREMARIN 0.625 MG TABLET: 0.625 | 90 days supply | Qty: 90 | Fill #1

## 2019-11-21 ENCOUNTER — Emergency Department
Admission: EM | Admit: 2019-11-21 | Discharge: 2019-11-21 | Disposition: A | Discharge status: Home / Self Care | Payer: 59 | Source: Home / Self Care | Attending: Family Medicine | Admitting: Family Medicine

## 2019-11-21 ENCOUNTER — Other Ambulatory Visit: Payer: Self-pay

## 2019-11-21 DIAGNOSIS — R05 Cough: Secondary | ICD-10-CM

## 2019-11-21 DIAGNOSIS — R059 Cough, unspecified: Secondary | ICD-10-CM

## 2019-11-21 DIAGNOSIS — J069 Acute upper respiratory infection, unspecified: Secondary | ICD-10-CM

## 2019-11-21 MED ORDER — ALBUTEROL SULFATE HFA 108 (90 BASE) MCG/ACT IN AERS: 2.0000 | INHALATION_SPRAY | RESPIRATORY_TRACT | 0 refills | Status: DC | PRN

## 2019-11-21 MED ORDER — PREDNISONE 20 MG PO TABS: ORAL_TABLET | ORAL | 0 refills | Status: DC

## 2019-11-21 NOTE — ED Provider Notes (Signed)
[name]    CSN: [id number] Arrival date & time: 11/21/19  1000      History   Chief Complaint Chief Complaint  Patient presents with  . Cough  . Headache  . Nasal Congestion    HPI IBBIE MERE is a 59 y.o. female.   Three days ago patient developed non-productive cough (worse at night), nasal congestion, and headache.  She denies fevers, chills, and sweats.   She denies chest tightness, shortness of breath, and changes in taste/smell. She had asthma as a child, and has a family history of asthma.    The history is provided by the patient.    Past Medical History:  Diagnosis Date  . Allergic rhinitis   . Complication of anesthesia   . Elevated transaminase level 12.2014   ?etiology - present 09/2013 hosp for epigastric pain  . Family history of adverse reaction to anesthesia    father - PONV  . GERD (gastroesophageal reflux disease)   . History of bronchitis 10/2014  . Migraine   . PONV (postoperative nausea and vomiting)   . Seasonal allergies     Patient Active Problem List   Diagnosis Date Noted  . Cervical disc disorder with radiculopathy of cervical region 10/12/2018  . Hyperlipidemia 09/07/2018  . Hip injury, right, initial encounter 07/03/2018  . Right hip pain 05/07/2018  . Sleep difficulties 06/06/2017  . Constipation 06/06/2017  . Insomnia 04/03/2017  . 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 Low back pain 02/13/2017  . Right sided sciatica 02/13/2017  . RLQ abdominal pain 08/20/2016  . Anal itching 08/20/2016  . GERD (gastroesophageal reflux disease) 06/05/2016  . Migraine 12/27/2015  . Headache 12/27/2015  . Lateral epicondylitis of left elbow 06/27/2015  . Cervical radiculitis 06/27/2015  . 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 BREAST BIOPSY Right   . BREAST EXCISIONAL BIOPSY    . BREAST LUMPECTOMY WITH RADIOACTIVE SEED  LOCALIZATION Right 10/08/2016   Procedure: RIGHT BREAST LUMPECTOMY WITH RADIOACTIVE SEED LOCALIZATION;  Surgeon: Coralie Keens, MD;  Location: [place] ;  Service: General;  Laterality: Right;  . CHOLECYSTECTOMY N/A 09/21/2015   Procedure: LAPAROSCOPIC CHOLECYSTECTOMY;  Surgeon: Coralie Keens, MD;  Location: [facility];  Service: General;  Laterality: N/A;  . COLONOSCOPY  12/2013  . NECK SURGERY  2009  . PARTIAL HYSTERECTOMY  2007  . UPPER GI ENDOSCOPY  2016    OB History   No obstetric history on file.      Home Medications    Prior to Admission medications   Medication Sig Start Date End Date Taking? Authorizing Provider  albuterol (VENTOLIN HFA) 108 (90 Base) MCG/ACT inhaler Inhale 2 puffs into the lungs every 4 (four) hours as needed for wheezing or shortness of breath. 11/21/19   Kandra Nicolas, MD  azelastine (ASTELIN) 0.1 % nasal spray Place 1-2 sprays into both nostrils 2 (two) times daily. Use in each nostril as directed 08/26/19 08/25/20  Baird Lyons D, MD  butalbital-acetaminophen-caffeine (FIORICET) [phone] MG tablet Take 1 tablet by mouth every 6 (six) hours as needed for headache. 07/29/19   Young, Kasandra Knudsen, MD  DEXILANT 30 MG capsule TAKE 1 CAPSULE BY MOUTH DAILY. 09/30/19   Binnie Rail, MD  DYMISTA 137-50 MCG/ACT SUSP Place 2 sprays into both nostrils at bedtime. 07/29/19   Baird Lyons D, MD  eletriptan (RELPAX) 40 MG tablet TAKE 1 TABLET BY MOUTH AS  NEEDED FOR MIGRAINE OR HEADACHE. MAY REPEAT IN 2 HOURS IF HEADACHE PERSISTS OR RECURS 09/27/19   Binnie Rail, MD  estrogens, conjugated, (PREMARIN) 0.625 MG tablet Premarin 0.625 mg tablet  Take 1 tablet(s) every day by oral route for 30 days. 08/09/19   [provider]  linaclotide Rolan Lipa) 145 MCG CAPS capsule Take 1 capsule (145 mcg total) by mouth daily before breakfast. 10/19/19   Janith Lima, MD  methocarbamol (ROBAXIN) 500 MG tablet Take 1 tablet (500 mg total) by mouth every 6 (six) hours as  needed for muscle spasms. 09/09/19   Binnie Rail, MD  nortriptyline (PAMELOR) 10 MG capsule Take 1-2 capsules (10-20 mg total) by mouth at bedtime. 09/09/19   Binnie Rail, MD  Pramoxine-HC (HYDROCORTISONE ACE-PRAMOXINE) 2.5-1 % CREA Apply TID-QID 09/20/19   Binnie Rail, MD  predniSONE (DELTASONE) 20 MG tablet Take one tab by mouth twice daily for 4 days, then one daily for 3 days. Take with food. 11/21/19   Kandra Nicolas, MD  pseudoephedrine (SUDAFED) 30 MG tablet Per box as needed    [provider]  sodium chloride (OCEAN) 0.65 % nasal spray Place 1 spray into the nose 4 (four) times daily as needed for congestion (and dryness).    [provider]    Family History Family History  Problem Relation Age of Onset  . Atrial fibrillation Father   . Diabetes type II Father   . Hypertension Father   . Coronary artery disease Father 35       stent x 2, AoVR  . Hypertension Mother   . Dementia Mother   . Hypertension Brother   . Breast cancer Maternal Aunt   . Breast cancer Maternal Grandmother   . Breast cancer Paternal Grandmother     Social History Social History   Tobacco Use  . Smoking status: Never Smoker  . Smokeless tobacco: Never Used  Substance Use Topics  . Alcohol use: Yes    Comment: occ  . Drug use: No     Allergies   Oxaprozin   Review of Systems Review of Systems No sore throat + cough No pleuritic pain No wheezing + nasal congestion ? post-nasal drainage No sinus pain/pressure No itchy/red eyes No earache No hemoptysis No SOB No fever/chills No nausea No vomiting No abdominal pain No diarrhea No urinary symptoms No skin rash No fatigue No myalgias + headache Used OTC meds (Sudafed, Flonase, Astelin, and saline spray) without relief   Physical Exam Triage Vital Signs ED Triage Vitals  Enc Vitals Group     BP 11/21/19 1112 129/82     Pulse Rate 11/21/19 1112 62     Resp 11/21/19 1112 18     Temp 11/21/19  1112 98.6 F (37 C)     Temp Source 11/21/19 1112 Oral     SpO2 11/21/19 1112 99 %     Weight 11/21/19 1113 133 lb (60.3 kg)     Height 11/21/19 1113 5' 3.75" (1.619 m)     Head Circumference --      Peak Flow --      Pain Score 11/21/19 1113 0     Pain Loc --      Pain Edu? --      Excl. in [place]? --    No data found.  Updated Vital Signs BP 129/82 (BP Location: Right Arm)   Pulse 62   Temp 98.6 F (37 C) (Oral)   Resp 18  Ht 5' 3.75" (1.619 m)   Wt 60.3 kg   SpO2 99%   BMI 23.01 kg/m   Visual Acuity Right Eye Distance:   Left Eye Distance:   Bilateral Distance:    Right Eye Near:   Left Eye Near:    Bilateral Near:     Physical Exam Nursing notes and Vital Signs reviewed. Appearance:  Patient appears stated age, and in no acute distress Eyes:  Pupils are equal, round, and reactive to light and accomodation.  Extraocular movement is intact.  Conjunctivae are not inflamed  Ears:  Canals normal.  Tympanic membranes normal.  Nose:  Mildly congested turbinates.  No sinus tenderness.  Pharynx:  Normal Neck:  Supple.  Mildly enlarged lateral nodes are present, tender to palpation on the left.   Lungs:  Clear to auscultation.  Breath sounds are equal.  Moving air well. Heart:  Regular rate and rhythm without murmurs, rubs, or gallops.  Abdomen:  Nontender without masses or hepatosplenomegaly.  Bowel sounds are present.  No CVA or flank tenderness.  Extremities:  No edema.  Skin:  No rash present.   UC Treatments / Results  Labs (all labs ordered are listed, but only abnormal results are displayed) Labs Reviewed  SARS-COV-2 RNA,(COVID-19) QUALITATIVE NAAT    EKG   Radiology No results found.  Procedures Procedures (including critical care time)  Medications Ordered in UC Medications - No data to display  Initial Impression / Assessment and Plan / UC Course  I have reviewed the triage vital signs and the nursing notes.  Pertinent labs & imaging results  that were available during my care of the patient were reviewed by me and considered in my medical decision making (see chart for details).    Benign exam.  There is no evidence of bacterial infection today.  Begin prednisone burst/taper.  Rx for albuterol inhaler.  COVID19 send out. Followup with Family Doctor if not improved in about 10 days.   Final Clinical Impressions(s) / UC Diagnoses   Final diagnoses:  Cough  Viral URI with cough     Discharge Instructions     Take plain guaifenesin (1200mg  extended release tabs such as Mucinex) twice daily, with plenty of water, for cough and congestion.  May add Pseudoephedrine (30mg , one or two every 4 to 6 hours) for sinus congestion.  Get adequate rest.   Continue nasal sprays as prescribed.  Continue saline nasal irrigation May take Delsym Cough Suppressant at bedtime for nighttime cough.   Stop all antihistamines for now, and other non-prescription cough/cold preparations.      ED Prescriptions    Medication Sig Dispense Auth. Provider   albuterol (VENTOLIN HFA) 108 (90 Base) MCG/ACT inhaler Inhale 2 puffs into the lungs every 4 (four) hours as needed for wheezing or shortness of breath. 18 g Kandra Nicolas, MD   predniSONE (DELTASONE) 20 MG tablet Take one tab by mouth twice daily for 4 days, then one daily for 3 days. Take with food. 11 tablet Kandra Nicolas, MD        Kandra Nicolas, MD 11/23/19 [phone]

## 2019-11-21 NOTE — Discharge Instructions (Addendum)
Take plain guaifenesin (1200mg  extended release tabs such as Mucinex) twice daily, with plenty of water, for cough and congestion.  May add Pseudoephedrine (30mg , one or two every 4 to 6 hours) for sinus congestion.  Get adequate rest.   Continue nasal sprays as prescribed.  Continue saline nasal irrigation May take Delsym Cough Suppressant at bedtime for nighttime cough.   Stop all antihistamines for now, and other non-prescription cough/cold preparations.

## 2019-11-21 NOTE — ED Triage Notes (Signed)
Pt c/o cough, headache and runny nose x 2 days. Hx of bronchitis and allergies. No known covid exposure. Did agree to send out COVID just to be safe. Denies fever or bodyaches.

## 2019-11-24 LAB — SARS-COV-2 RNA,(COVID-19) QUALITATIVE NAAT: SARS CoV2 RNA: NOT DETECTED

## 2019-12-07 MED FILL — ELETRIPTAN HBR 40 MG TABLET: 40 | 25 days supply | Qty: 10 | Fill #2

## 2019-12-07 MED FILL — BUTALB-ACETAMIN-CAFF 50-325: 50-325-40 | 18 days supply | Qty: 75 | Fill #2

## 2020-01-06 MED FILL — ELETRIPTAN HBR 40 MG TABLET: 40 | 25 days supply | Qty: 10 | Fill #3

## 2020-01-06 MED FILL — METHOCARBAMOL 500 MG TABS: 500 | 15 days supply | Qty: 60 | Fill #2

## 2020-01-07 MED FILL — BUTALBITAL-APAP-CAFFEINE 50: 50-325-40 | 18 days supply | Qty: 75 | Fill #3

## 2020-01-07 MED FILL — DEXILANT DR 30 MG CAPSULE: 30 | 90 days supply | Qty: 90 | Fill #1

## 2020-01-15 ENCOUNTER — Telehealth: Payer: 59 | Admitting: Emergency Medicine

## 2020-01-15 DIAGNOSIS — J329 Chronic sinusitis, unspecified: Secondary | ICD-10-CM | POA: Diagnosis not present

## 2020-01-15 MED ORDER — SALINE SPRAY 0.65 % NA SOLN: 1.0000 | NASAL | 0 refills | Status: AC | PRN

## 2020-01-15 MED ORDER — AMOXICILLIN-POT CLAVULANATE 875-125 MG PO TABS: 1.0000 | ORAL_TABLET | Freq: Two times a day (BID) | ORAL | 0 refills | Status: DC

## 2020-01-15 NOTE — Progress Notes (Signed)
We are sorry that you are not feeling well.  Here is how we plan to help!  Based on what you have shared with me it looks like you have sinusitis.   You commented that you feel like it has significantly worsened in the past day or two.  Because of this, your symptoms may be caused by a bacteria, rather than a virus.  Sinusitis is inflammation and infection in the sinus cavities of the head.  Based on your presentation I believe you most likely have Acute Bacterial Sinusitis.  This is an infection caused by bacteria and is treated with antibiotics. I have prescribed Augmentin 875mg /125mg  one tablet twice daily with food, for 7 days. You may use an oral decongestant such as Mucinex D or if you have glaucoma or high blood pressure use plain Mucinex. Saline nasal spray help and can safely be used as often as needed for congestion.  If you develop worsening sinus pain, fever or notice severe headache and vision changes, or if symptoms are not better after completion of antibiotic, please schedule an appointment with a health care provider.    Sinus infections are not as easily transmitted as other respiratory infection, however we still recommend that you avoid close contact with loved ones, especially the very young and elderly.  Remember to wash your hands thoroughly throughout the day as this is the number one way to prevent the spread of infection!  Home Care:  Only take medications as instructed by your medical team.  Complete the entire course of an antibiotic.  Do not take these medications with alcohol.  A steam or ultrasonic humidifier can help congestion.  You can place a towel over your head and breathe in the steam from hot water coming from a faucet.  Avoid close contacts especially the very young and the elderly.  Cover your mouth when you cough or sneeze.  Always remember to wash your hands.  Get Help Right Away If:  You develop worsening fever or sinus pain.  You develop a  severe head ache or visual changes.  Your symptoms persist after you have completed your treatment plan.  Make sure you  Understand these instructions.  Will watch your condition.  Will get help right away if you are not doing well or get worse.  Your e-visit answers were reviewed by a board certified advanced clinical practitioner to complete your personal care plan.  Depending on the condition, your plan could have included both over the counter or prescription medications.  If there is a problem please reply  once you have received a response from your provider.  Your safety is important to [place].  If you have drug allergies check your prescription carefully.    You can use MyChart to ask questions about today's visit, request a non-urgent call back, or ask for a work or school excuse for 24 hours related to this e-Visit. If it has been greater than 24 hours you will need to follow up with your provider, or enter a new e-Visit to address those concerns.  You will get an e-mail in the next two days asking about your experience.  I hope that your e-visit has been valuable and will speed your recovery. Thank you for using e-visits.   Approximately 5 minutes was used in reviewing the patient's chart, questionnaire, prescribing medications, and documentation.

## 2020-01-27 ENCOUNTER — Telehealth: Payer: 59 | Admitting: Physician Assistant

## 2020-01-27 DIAGNOSIS — R300 Dysuria: Secondary | ICD-10-CM | POA: Diagnosis not present

## 2020-01-27 MED ORDER — CEPHALEXIN 500 MG PO CAPS: 500.0000 mg | ORAL_CAPSULE | Freq: Two times a day (BID) | ORAL | 0 refills | Status: AC

## 2020-01-27 MED FILL — CEPHALEXIN 500 MG CAPSULE: 500 | 7 days supply | Qty: 14 | Fill #0

## 2020-01-27 NOTE — Progress Notes (Signed)

## 2020-02-03 ENCOUNTER — Encounter: Payer: Self-pay | Admitting: Internal Medicine

## 2020-02-07 ENCOUNTER — Other Ambulatory Visit: Payer: Self-pay | Admitting: Internal Medicine

## 2020-02-07 MED ORDER — LINACLOTIDE 72 MCG PO CAPS: 72.0000 ug | ORAL_CAPSULE | Freq: Every day | ORAL | 1 refills | Status: DC

## 2020-02-08 MED FILL — LINZESS 72 MCG CAPSULE: 72 | 90 days supply | Qty: 90 | Fill #0

## 2020-02-14 MED FILL — BUTALB-ACETAMIN-CAFF 50-325: 50-325-40 | 18 days supply | Qty: 75 | Fill #4

## 2020-02-14 MED FILL — ELETRIPTAN HYDROBROMIDE 40: 40 | 25 days supply | Qty: 10 | Fill #4

## 2020-03-17 ENCOUNTER — Other Ambulatory Visit: Payer: Self-pay | Admitting: Internal Medicine

## 2020-03-17 MED FILL — METHOCARBAMOL 500 MG TABS: 500 | 15 days supply | Qty: 60 | Fill #0

## 2020-03-17 MED FILL — PREMARIN 0.625 MG TABLET: 0.625 | 90 days supply | Qty: 90 | Fill #2

## 2020-03-17 MED FILL — ELETRIPTAN HYDROBROMIDE 40: 40 | 25 days supply | Qty: 10 | Fill #5

## 2020-03-17 MED FILL — BUTALB-ACETAMIN-CAFF 50-325: 50-325-40 | 18 days supply | Qty: 75 | Fill #5

## 2020-03-23 ENCOUNTER — Ambulatory Visit: Payer: 59 | Admitting: Family Medicine

## 2020-03-23 ENCOUNTER — Ambulatory Visit: Payer: Self-pay

## 2020-03-23 ENCOUNTER — Other Ambulatory Visit: Payer: Self-pay

## 2020-03-23 ENCOUNTER — Encounter: Payer: Self-pay | Admitting: Family Medicine

## 2020-03-23 VITALS — BP 125/81 | HR 79 | Ht 64.0 in | Wt 131.0 lb

## 2020-03-23 DIAGNOSIS — M222X2 Patellofemoral disorders, left knee: Secondary | ICD-10-CM | POA: Insufficient documentation

## 2020-03-23 DIAGNOSIS — M25562 Pain in left knee: Secondary | ICD-10-CM

## 2020-03-23 MED ORDER — PENNSAID 2 % EX SOLN: 1.0000 "application " | Freq: Two times a day (BID) | CUTANEOUS | 3 refills | Status: DC

## 2020-03-23 NOTE — Progress Notes (Signed)
Mindy [name] - 59 y.o. female MRN [id number]  Date of birth: 06/24/1961  SUBJECTIVE:  Including CC & ROS.  Chief Complaint  Patient presents with  . Knee Pain    left    Mindy [name] is a 59 y.o. female that is presenting with left knee pain.  The pain is acute in nature.  It seems worse with getting up from a seated position or with walking.  No inciting event or trauma.  No history of surgery.  Symptoms are intermittent in nature.  Historically she has had more problems with the right hip.   Review of Systems See HPI   HISTORY: Past Medical, Surgical, Social, and Family History Reviewed & Updated per EMR.   Pertinent Historical Findings include:  Past Medical History:  Diagnosis Date  . Allergic rhinitis   . Complication of anesthesia   . Elevated transaminase level 12.2014   ?etiology - present 09/2013 hosp for epigastric pain  . Family history of adverse reaction to anesthesia    father - PONV  . GERD (gastroesophageal reflux disease)   . History of bronchitis 10/2014  . Migraine   . PONV (postoperative nausea and vomiting)   . Seasonal allergies     Past Surgical History:  Procedure Laterality Date  . BREAST BIOPSY Right   . BREAST EXCISIONAL BIOPSY    . BREAST LUMPECTOMY WITH RADIOACTIVE SEED LOCALIZATION Right 10/08/2016   Procedure: RIGHT BREAST LUMPECTOMY WITH RADIOACTIVE SEED LOCALIZATION;  Surgeon: Coralie Keens, MD;  Location: [place];  Service: General;  Laterality: Right;  . CHOLECYSTECTOMY N/A 09/21/2015   Procedure: LAPAROSCOPIC CHOLECYSTECTOMY;  Surgeon: Coralie Keens, MD;  Location: [place];  Service: General;  Laterality: N/A;  . COLONOSCOPY  12/2013  . NECK SURGERY  2009  . PARTIAL HYSTERECTOMY  2007  . UPPER GI ENDOSCOPY  2016    Family History  Problem Relation Age of Onset  . Atrial fibrillation Father   . Diabetes type II Father   . Hypertension Father   . Coronary artery disease Father 58       stent x 2, AoVR  . Hypertension Mother     . Dementia Mother   . Hypertension Brother   . Breast cancer Maternal Aunt   . Breast cancer Maternal Grandmother   . Breast cancer Paternal Grandmother     Social History   Socioeconomic History  . Marital status: Divorced    Spouse name: Not on file  . Number of children: Not on file  . Years of education: Not on file  . Highest education level: Not on file  Occupational History  . Occupation: Therapist, sports  Tobacco Use  . Smoking status: Never Smoker  . Smokeless tobacco: Never Used  Substance and Sexual Activity  . Alcohol use: Yes    Comment: occ  . Drug use: No  . Sexual activity: Not on file  Other Topics Concern  . Not on file  Social History Narrative   Exercise: on average 3/week - running, weights sometimes   Social Determinants of Health   Financial Resource Strain:   . Difficulty of Paying Living Expenses:   Food Insecurity:   . Worried About Charity fundraiser in the Last Year:   . Arboriculturist in the Last Year:   Transportation Needs:   . Film/video editor (Medical):   Marland Kitchen Lack of Transportation (Non-Medical):   Physical Activity:   . Days of Exercise per Week:   . Minutes  of Exercise per Session:   Stress:   . Feeling of Stress :   Social Connections:   . Frequency of Communication with Friends and Family:   . Frequency of Social Gatherings with Friends and Family:   . Attends Religious Services:   . Active Member of Clubs or Organizations:   . Attends Archivist Meetings:   Marland Kitchen Marital Status:   Intimate Partner Violence:   . Fear of Current or Ex-Partner:   . Emotionally Abused:   Marland Kitchen Physically Abused:   . Sexually Abused:      PHYSICAL EXAM:  VS: BP 125/81   Pulse 79   Ht 5\' 4"  (1.626 m)   Wt 131 lb (59.4 kg)   BMI 22.49 kg/m  Physical Exam Gen: NAD, alert, cooperative with exam, well-appearing MSK:  Left knee: No obvious effusion. Normal strength resistance. No tenderness palpation of the medial or lateral joint  space. No instability. Negative McMurray's test. Weakness with hip abduction on the left but it is worse on the right. Instability with 1 leg standing. Weakness with hip flexion on the right and abduction compared to the left. Neurovascularly intact  Limited ultrasound: Left knee:  No effusion is suprapatellar pouch. Normal-appearing quadricep and patellar tendon. Normal joint space in the medial compartment with minor degenerative changes of the meniscus. Normal lateral joint space and minor degenerative change of the meniscus.  Summary: Minor degenerative meniscal changes.  Ultrasound and interpretation by Clearance Coots, MD    ASSESSMENT & PLAN:   Patellofemoral pain syndrome of left knee Ultrasound was reassuring with no significant structural changes.  Has significant weakness with hip abduction with right worse than left.  Likely related to dynamic process as the source of her knee pain. -Counseled on home exercise therapy and supportive care. -Pennsaid and sample provided. -Could consider imaging or physical therapy.

## 2020-03-23 NOTE — Patient Instructions (Signed)
Nice to meet you Please try ice as needed  Please try the pennsaid  Please try the exercises   Please try to avoid walking as the main exercises. Try biking or something in the water   Please send me a message in MyChart with any questions or updates.  Please see me back in 4 weeks.   --Dr. Raeford Razor

## 2020-03-23 NOTE — Assessment & Plan Note (Signed)
Ultrasound was reassuring with no significant structural changes.  Has significant weakness with hip abduction with right worse than left.  Likely related to dynamic process as the source of her knee pain. -Counseled on home exercise therapy and supportive care. -Pennsaid and sample provided. -Could consider imaging or physical therapy.

## 2020-03-23 NOTE — Progress Notes (Signed)
Medication Samples have been provided to the patient.  Drug name: Pennsaid       Strength: 2%        Qty: 1 Box  LOT: [id number]  Exp.Date: 10/2020  Dosing instructions: Use a pea size amount and rub gently.  The patient has been instructed regarding the correct time, dose, and frequency of taking this medication, including desired effects and most common side effects.   Sherrie George, MA 11:19 AM 03/23/2020

## 2020-04-17 ENCOUNTER — Other Ambulatory Visit: Payer: Self-pay | Admitting: Internal Medicine

## 2020-04-17 DIAGNOSIS — L309 Dermatitis, unspecified: Secondary | ICD-10-CM | POA: Diagnosis not present

## 2020-04-17 MED ORDER — BUTALBITAL-APAP-CAFFEINE 50-325-40 MG PO TABS: 1.0000 | ORAL_TABLET | Freq: Four times a day (QID) | ORAL | 5 refills | Status: DC | PRN

## 2020-04-17 MED FILL — HYDROCORTISONE-PRAMOXINE CR: 2.5-1 | 10 days supply | Qty: 28 | Fill #1

## 2020-04-17 MED FILL — BUTALB-ACETAMIN-CAFF 50-325: 50-325-40 | 18 days supply | Qty: 75 | Fill #0

## 2020-04-17 MED FILL — AZELASTINE HCL 137 MCG/SPRA: 137 | 25 days supply | Qty: 30 | Fill #1

## 2020-04-17 MED FILL — DEXILANT DR 30 MG CAPSULE: 30 | 90 days supply | Qty: 90 | Fill #2

## 2020-04-17 MED FILL — TRIAMCINOLONE ACETONIDE 0.1: 0.1 | 15 days supply | Qty: 60 | Fill #0

## 2020-04-17 NOTE — Telephone Encounter (Signed)
Fioricet refill sent to [place]

## 2020-04-17 NOTE — Telephone Encounter (Signed)
Dr. Annamaria Boots please advise if you are ok with me sending in refill for this patient  Would it be possible to send a medication refill for the butalbital-acetaminophen-caffeine 50-325-40 MG tablet to the [facility] my prescription does not have any more refills on it. Thank you, Mindy [name] [phone]

## 2020-04-20 ENCOUNTER — Ambulatory Visit: Payer: 59 | Admitting: Family Medicine

## 2020-05-18 DIAGNOSIS — R61 Generalized hyperhidrosis: Secondary | ICD-10-CM | POA: Diagnosis not present

## 2020-05-18 DIAGNOSIS — L659 Nonscarring hair loss, unspecified: Secondary | ICD-10-CM | POA: Diagnosis not present

## 2020-05-18 MED FILL — ELETRIPTAN HYDROBROMIDE 40: 40 | 25 days supply | Qty: 10 | Fill #6

## 2020-05-18 MED FILL — BUTALB-ACETAMIN-CAFF 50-325: 50-325-40 | 18 days supply | Qty: 75 | Fill #1

## 2020-05-18 MED FILL — METHOCARBAMOL 500 MG TABS: 500 | 15 days supply | Qty: 60 | Fill #1

## 2020-05-19 MED FILL — PREMARIN 0.9 MG TABLET: 0.9 | 84 days supply | Qty: 84 | Fill #0

## 2020-05-30 DIAGNOSIS — R61 Generalized hyperhidrosis: Secondary | ICD-10-CM | POA: Diagnosis not present

## 2020-05-30 DIAGNOSIS — L814 Other melanin hyperpigmentation: Secondary | ICD-10-CM | POA: Diagnosis not present

## 2020-05-30 DIAGNOSIS — L820 Inflamed seborrheic keratosis: Secondary | ICD-10-CM | POA: Diagnosis not present

## 2020-05-30 DIAGNOSIS — S40262A Insect bite (nonvenomous) of left shoulder, initial encounter: Secondary | ICD-10-CM | POA: Diagnosis not present

## 2020-05-30 DIAGNOSIS — D1801 Hemangioma of skin and subcutaneous tissue: Secondary | ICD-10-CM | POA: Diagnosis not present

## 2020-05-30 DIAGNOSIS — L818 Other specified disorders of pigmentation: Secondary | ICD-10-CM | POA: Diagnosis not present

## 2020-05-30 DIAGNOSIS — L309 Dermatitis, unspecified: Secondary | ICD-10-CM | POA: Diagnosis not present

## 2020-05-30 DIAGNOSIS — L578 Other skin changes due to chronic exposure to nonionizing radiation: Secondary | ICD-10-CM | POA: Diagnosis not present

## 2020-06-04 ENCOUNTER — Encounter: Payer: Self-pay | Admitting: Emergency Medicine

## 2020-06-04 ENCOUNTER — Emergency Department
Admission: EM | Admit: 2020-06-04 | Discharge: 2020-06-04 | Disposition: A | Discharge status: Home / Self Care | Payer: 59 | Source: Home / Self Care | Attending: Family Medicine | Admitting: Family Medicine

## 2020-06-04 ENCOUNTER — Emergency Department: Admit: 2020-06-04 | Discharge status: Against Medical Advice | Payer: Self-pay

## 2020-06-04 ENCOUNTER — Other Ambulatory Visit: Payer: Self-pay

## 2020-06-04 ENCOUNTER — Emergency Department (INDEPENDENT_AMBULATORY_CARE_PROVIDER_SITE_OTHER): Payer: 59

## 2020-06-04 DIAGNOSIS — M79632 Pain in left forearm: Secondary | ICD-10-CM | POA: Diagnosis not present

## 2020-06-04 DIAGNOSIS — W1830XA Fall on same level, unspecified, initial encounter: Secondary | ICD-10-CM | POA: Diagnosis not present

## 2020-06-04 DIAGNOSIS — S43402A Unspecified sprain of left shoulder joint, initial encounter: Secondary | ICD-10-CM

## 2020-06-04 DIAGNOSIS — M25512 Pain in left shoulder: Secondary | ICD-10-CM

## 2020-06-04 DIAGNOSIS — S59912A Unspecified injury of left forearm, initial encounter: Secondary | ICD-10-CM | POA: Diagnosis not present

## 2020-06-04 DIAGNOSIS — S6992XA Unspecified injury of left wrist, hand and finger(s), initial encounter: Secondary | ICD-10-CM | POA: Diagnosis not present

## 2020-06-04 DIAGNOSIS — S4992XA Unspecified injury of left shoulder and upper arm, initial encounter: Secondary | ICD-10-CM | POA: Diagnosis not present

## 2020-06-04 DIAGNOSIS — S63502A Unspecified sprain of left wrist, initial encounter: Secondary | ICD-10-CM

## 2020-06-04 MED ORDER — HYDROCODONE-ACETAMINOPHEN 5-325 MG PO TABS: ORAL_TABLET | ORAL | 0 refills | Status: DC

## 2020-06-04 NOTE — Discharge Instructions (Addendum)
Continue Aleve.  May take Tylenol as needed for pain daytime.  Wear sling for about one week.  Apply ice pack for 20 to 30 minutes, 3 to 4 times daily  Continue until pain and swelling decrease.  Begin range of motion and stretching exercises as tolerated.

## 2020-06-04 NOTE — ED Triage Notes (Signed)
Patient fell and landed on left arm 3 days ago; now is sore from left shoulder through wrist area; some limits to ROM. Took Aleve at 0830. Has had covid vaccinations.

## 2020-06-04 NOTE — ED Provider Notes (Signed)
[name]    CSN: [id number] Arrival date & time: 06/04/20  1406      History   Chief Complaint Chief Complaint  Patient presents with  . Arm Injury    HPI [name] is a 59 y.o. female.   Patient fell and landed on her left arm three days ago.  She has mild pain with movement and some limitation of movement.  The history is provided by the patient.  Arm Injury Location:  Shoulder and wrist Shoulder location:  L shoulder Wrist location:  L wrist Injury: yes   Mechanism of injury: fall   Pain details:    Quality:  Aching   Radiates to:  Does not radiate   Severity:  Mild   Onset quality:  Sudden   Duration:  2 days   Timing:  Constant   Progression:  Improving Dislocation: no   Prior injury to area:  No Relieved by:  Nothing Worsened by:  Movement Ineffective treatments:  NSAIDs Associated symptoms: decreased range of motion and stiffness   Associated symptoms: no muscle weakness, no numbness, no swelling and no tingling     Past Medical History:  Diagnosis Date  . Allergic rhinitis   . Complication of anesthesia   . Elevated transaminase level 12.2014   ?etiology - present 09/2013 hosp for epigastric pain  . Family history of adverse reaction to anesthesia    father - PONV  . GERD (gastroesophageal reflux disease)   . History of bronchitis 10/2014  . Migraine   . PONV (postoperative nausea and vomiting)   . Seasonal allergies     Patient Active Problem List   Diagnosis Date Noted  . Patellofemoral pain syndrome of left knee 03/23/2020  . Cervical disc disorder with radiculopathy of cervical region 10/12/2018  . Hyperlipidemia 09/07/2018  . Hip injury, right, initial encounter 07/03/2018  . Right hip pain 05/07/2018  . Sleep difficulties 06/06/2017  . Constipation 06/06/2017  . Insomnia 04/03/2017  . 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 Low back pain 02/13/2017  . Right sided sciatica  02/13/2017  . RLQ abdominal pain 08/20/2016  . Anal itching 08/20/2016  . GERD (gastroesophageal reflux disease) 06/05/2016  . Migraine 12/27/2015  . Headache 12/27/2015  . Lateral epicondylitis of left elbow 06/27/2015  . Cervical radiculitis 06/27/2015  . 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 BREAST BIOPSY Right   . BREAST EXCISIONAL BIOPSY    . BREAST LUMPECTOMY WITH RADIOACTIVE SEED LOCALIZATION Right 10/08/2016   Procedure: RIGHT BREAST LUMPECTOMY WITH RADIOACTIVE SEED LOCALIZATION;  Surgeon: Coralie Keens, MD;  Location: [place];  Service: General;  Laterality: Right;  . CHOLECYSTECTOMY N/A 09/21/2015   Procedure: LAPAROSCOPIC CHOLECYSTECTOMY;  Surgeon: Coralie Keens, MD;  Location: [place];  Service: General;  Laterality: N/A;  . COLONOSCOPY  12/2013  . NECK SURGERY  2009  . PARTIAL HYSTERECTOMY  2007  . UPPER GI ENDOSCOPY  2016    OB History   No obstetric history on file.      Home Medications    Prior to Admission medications   Medication Sig Start Date End Date Taking? Authorizing Provider  albuterol (VENTOLIN HFA) 108 (90 Base) MCG/ACT inhaler Inhale 2 puffs into the lungs every 4 (four) hours as needed for wheezing or shortness of breath. 11/21/19   Kandra Nicolas, MD  amoxicillin-clavulanate (AUGMENTIN) 875-125 MG tablet Take 1 tablet by mouth every 12 (  twelve) hours. 01/15/20   Montine Circle, PA-C  azelastine (ASTELIN) 0.1 % nasal spray Place 1-2 sprays into both nostrils 2 (two) times daily. Use in each nostril as directed 08/26/19 08/25/20  Baird Lyons D, MD  butalbital-acetaminophen-caffeine (FIORICET) [id number] MG tablet Take 1 tablet by mouth every 6 (six) hours as needed for headache. 04/17/20   Deneise Lever, MD  DEXILANT 30 MG capsule TAKE 1 CAPSULE BY MOUTH DAILY. 09/30/19   Binnie Rail, MD  Diclofenac Sodium (PENNSAID) 2 % SOLN Place 1 application onto the skin 2 (two) times daily.  03/23/20   Rosemarie Ax, MD  DYMISTA [phone] MCG/ACT SUSP Place 2 sprays into both nostrils at bedtime. 07/29/19   Young, Tarri Fuller D, MD  eletriptan (RELPAX) 40 MG tablet TAKE 1 TABLET BY MOUTH AS NEEDED FOR MIGRAINE OR HEADACHE. MAY REPEAT IN 2 HOURS IF HEADACHE PERSISTS OR RECURS 09/27/19   Binnie Rail, MD  estrogens, conjugated, (PREMARIN) 0.625 MG tablet 0.625 mg.  08/09/19   [provider]  HYDROcodone-acetaminophen (NORCO/VICODIN) 5-325 MG tablet Take one by mouth at bedtime as needed for pain 06/04/20   Kandra Nicolas, MD  linaclotide [facility]) 72 MCG capsule Take 1 capsule (72 mcg total) by mouth daily before breakfast. 02/07/20   Burns, Claudina Lick, MD  methocarbamol (ROBAXIN) 500 MG tablet TAKE 1 TABLET BY MOUTH EVERY 6 HOURS AS NEEDED FOR MUSCLE SPASMS 03/17/20   Binnie Rail, MD  nortriptyline (PAMELOR) 10 MG capsule Take 1-2 capsules (10-20 mg total) by mouth at bedtime. 09/09/19   Binnie Rail, MD  Pramoxine-HC (HYDROCORTISONE ACE-PRAMOXINE) 2.5-1 % CREA Apply TID-QID 09/20/19   Binnie Rail, MD  predniSONE (DELTASONE) 20 MG tablet Take one tab by mouth twice daily for 4 days, then one daily for 3 days. Take with food. 11/21/19   Kandra Nicolas, MD  pseudoephedrine (SUDAFED) 30 MG tablet Per box as needed    [provider]  sodium chloride (OCEAN) 0.65 % SOLN nasal spray Place 1 spray into both nostrils as needed for congestion. 01/15/20   Montine Circle, PA-C    Family History Family History  Problem Relation Age of Onset  . Atrial fibrillation Father   . Diabetes type II Father   . Hypertension Father   . Coronary artery disease Father 40       stent x 2, AoVR  . Hypertension Mother   . Dementia Mother   . Hypertension Brother   . Breast cancer Maternal Aunt   . Breast cancer Maternal Grandmother   . Breast cancer Paternal Grandmother     Social History Social History   Tobacco Use  . Smoking status: Never Smoker  . Smokeless tobacco: Never  Used  Vaping Use  . Vaping Use: Never used  Substance Use Topics  . Alcohol use: Yes    Comment: occ  . Drug use: No     Allergies   Oxaprozin   Review of Systems Review of Systems  Musculoskeletal: Positive for stiffness.  All other systems reviewed and are negative.    Physical Exam Triage Vital Signs ED Triage Vitals  Enc Vitals Group     BP 06/04/20 1514 110/77     Pulse Rate 06/04/20 1514 70     Resp 06/04/20 1514 16     Temp 06/04/20 1514 97.8 F (36.6 C)     Temp Source 06/04/20 1514 Oral     SpO2 06/04/20 1514 100 %  Weight 06/04/20 1515 128 lb (58.1 kg)     Height 06/04/20 1515 5\' 4"  (1.626 m)     Head Circumference --      Peak Flow --      Pain Score 06/04/20 1515 3     Pain Loc --      Pain Edu? --      Excl. in [place]? --    No data found.  Updated Vital Signs BP 110/77 (BP Location: Right Arm)   Pulse 70   Temp 97.8 F (36.6 C) (Oral)   Resp 16   Ht 5\' 4"  (1.626 m)   Wt 58.1 kg   SpO2 100%   BMI 21.97 kg/m   Visual Acuity Right Eye Distance:   Left Eye Distance:   Bilateral Distance:    Right Eye Near:   Left Eye Near:    Bilateral Near:     Physical Exam Vitals and nursing note reviewed.  Constitutional:      General: She is not in acute distress. HENT:     Head: Normocephalic.  Eyes:     Pupils: Pupils are equal, round, and reactive to light.  Cardiovascular:     Rate and Rhythm: Normal rate.  Pulmonary:     Effort: Pulmonary effort is normal.  Musculoskeletal:     Left shoulder: No swelling, tenderness or crepitus. Normal strength.     Cervical back: Normal range of motion.     Comments: Patient moves her left shoulder cautiously.  She is able to perform Apley's test slowly.  Good internal/external strength.  Empty can test negative.  Distal neurovascular function is intact.   Left forearm has mild tenderness centrally without swelling or ecchymosis.  Left wrist has minimal tenderness to palpation and good range of  motion.  No soft tissue swelling.  No snuffbox tenderness.    Skin:    General: Skin is warm and dry.  Neurological:     Mental Status: She is alert.      UC Treatments / Results  Labs (all labs ordered are listed, but only abnormal results are displayed) Labs Reviewed - No data to display  EKG   Radiology DG Forearm Left  Result Date: 06/04/2020 CLINICAL DATA:  Fall 3 days ago with left forearm pain. EXAM: LEFT FOREARM - 2 VIEW COMPARISON:  None. FINDINGS: There is no evidence of fracture or other focal bone lesions. Soft tissues are unremarkable. IMPRESSION: Negative. Electronically Signed   By: Marin Olp M.D.   On: 06/04/2020 15:55   DG Wrist Complete Left  Result Date: 06/04/2020 CLINICAL DATA:  Status post fall 3 days ago. EXAM: LEFT WRIST - COMPLETE 3+ VIEW COMPARISON:  None. FINDINGS: There is no evidence of fracture or dislocation. There is no evidence of arthropathy or other focal bone abnormality. Soft tissues are unremarkable. IMPRESSION: Negative. Electronically Signed   By: Virgina Norfolk M.D.   On: 06/04/2020 16:00   DG Shoulder Left  Result Date: 06/04/2020 CLINICAL DATA:  Status post fall 3 days ago. EXAM: LEFT SHOULDER - 2+ VIEW COMPARISON:  None. FINDINGS: There is no evidence of fracture or dislocation. There is no evidence of arthropathy or other focal bone abnormality. Soft tissues are unremarkable. IMPRESSION: Negative. Electronically Signed   By: Virgina Norfolk M.D.   On: 06/04/2020 15:54    Procedures Procedures (including critical care time)  Medications Ordered in UC Medications - No data to display  Initial Impression / Assessment and Plan / UC Course  I have reviewed the triage vital signs and the nursing notes.  Pertinent labs & imaging results that were available during my care of the patient were reviewed by me and considered in my medical decision making (see chart for details).    Dispensed sling.  Rx for Vicodin at bedtime (#5,  no refill).  Given sprain treatment instructions with range of motion and stretching exercises.  Followup with [facility] if not improving about two weeks.    Final Clinical Impressions(s) / UC Diagnoses   Final diagnoses:  Sprain of left shoulder, unspecified shoulder sprain type, initial encounter  Sprain of left wrist, initial encounter     Discharge Instructions     Continue Aleve.  May take Tylenol as needed for pain daytime.  Wear sling for about one week.  Begin range of motion and stretching exercises as tolerated.    ED Prescriptions    Medication Sig Dispense Auth. Provider   HYDROcodone-acetaminophen (NORCO/VICODIN) 5-325 MG tablet Take one by mouth at bedtime as needed for pain 5 tablet Kandra Nicolas, MD        Kandra Nicolas, MD 06/11/20 1539

## 2020-06-13 ENCOUNTER — Ambulatory Visit: Payer: 59 | Admitting: Family Medicine

## 2020-06-13 ENCOUNTER — Encounter: Payer: Self-pay | Admitting: Family Medicine

## 2020-06-13 ENCOUNTER — Other Ambulatory Visit: Payer: Self-pay

## 2020-06-13 VITALS — BP 128/86 | HR 85 | Ht 64.0 in | Wt 128.0 lb

## 2020-06-13 DIAGNOSIS — M7552 Bursitis of left shoulder: Secondary | ICD-10-CM | POA: Insufficient documentation

## 2020-06-13 DIAGNOSIS — M778 Other enthesopathies, not elsewhere classified: Secondary | ICD-10-CM

## 2020-06-13 DIAGNOSIS — S43439A Superior glenoid labrum lesion of unspecified shoulder, initial encounter: Secondary | ICD-10-CM | POA: Insufficient documentation

## 2020-06-13 NOTE — Assessment & Plan Note (Signed)
Initial injury on 8/13.  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  Capsule seems to be irritated from the trauma.  No suggestion of rotator cuff tear on exam. -Counseled on home exercise therapy and supportive care. -Counseled on sling. -Provided work note. -Could consider physical therapy or injection.

## 2020-06-13 NOTE — Progress Notes (Signed)
Mindy [name] - 59 y.o. female MRN [id number]  Date of birth: [birth year]  SUBJECTIVE:  Including CC & ROS.  Chief Complaint  Patient presents with  . Shoulder Pain    left x 06/02/20  . Hand Pain    left x 06/02/20    Mindy [name] is a 59 y.o. female that is presenting with left shoulder pain with altered sensations in the arm.  Her initial fall was on 8/13.  She fell with her arm in full extension.  Since that time she has had this shoulder pain with a glovelike distribution of altered sensation of the forearm and hand.  Symptoms seem to be staying the same.  No history of shoulder surgery.  Does have a history of cervical spine surgery..  Independent review of the left shoulder x-ray from 8/15 shows no acute changes.  Independent review of the left wrist from 8/15 shows no acute changes.  Independent review of the left forearm x-ray from 8/15 shows no acute changes.   Review of Systems See HPI   HISTORY: Past Medical, Surgical, Social, and Family History Reviewed & Updated per EMR.   Pertinent Historical Findings include:  Past Medical History:  Diagnosis Date  . Allergic rhinitis   . Complication of anesthesia   . Elevated transaminase level 12.2014   ?etiology - present 09/2013 hosp for epigastric pain  . Family history of adverse reaction to anesthesia    father - PONV  . GERD (gastroesophageal reflux disease)   . History of bronchitis 10/2014  . Migraine   . PONV (postoperative nausea and vomiting)   . Seasonal allergies     Past Surgical History:  Procedure Laterality Date  . BREAST BIOPSY Right   . BREAST EXCISIONAL BIOPSY    . BREAST LUMPECTOMY WITH RADIOACTIVE SEED LOCALIZATION Right 10/08/2016   Procedure: RIGHT BREAST LUMPECTOMY WITH RADIOACTIVE SEED LOCALIZATION;  Surgeon: Coralie Keens, MD;  Location: [place];  Service: General;  Laterality: Right;  . CHOLECYSTECTOMY N/A 09/21/2015   Procedure: LAPAROSCOPIC CHOLECYSTECTOMY;  Surgeon: Coralie Keens, MD;   Location: [place];  Service: General;  Laterality: N/A;  . COLONOSCOPY  12/2013  . NECK SURGERY  2009  . PARTIAL HYSTERECTOMY  2007  . UPPER GI ENDOSCOPY  2016    Family History  Problem Relation Age of Onset  . Atrial fibrillation Father   . Diabetes type II Father   . Hypertension Father   . Coronary artery disease Father 10       stent x 2, AoVR  . Hypertension Mother   . Dementia Mother   . Hypertension Brother   . Breast cancer Maternal Aunt   . Breast cancer Maternal Grandmother   . Breast cancer Paternal Grandmother     Social History   Socioeconomic History  . Marital status: Divorced    Spouse name: Not on file  . Number of children: Not on file  . Years of education: Not on file  . Highest education level: Not on file  Occupational History  . Occupation: Therapist, sports  Tobacco Use  . Smoking status: Never Smoker  . Smokeless tobacco: Never Used  Vaping Use  . Vaping Use: Never used  Substance and Sexual Activity  . Alcohol use: Yes    Comment: occ  . Drug use: No  . Sexual activity: Not on file  Other Topics Concern  . Not on file  Social History Narrative   Exercise: on average 3/week - running, weights sometimes  Social Determinants of Health   Financial Resource Strain:   . Difficulty of Paying Living Expenses: Not on file  Food Insecurity:   . Worried About Charity fundraiser in the Last Year: Not on file  . Ran Out of Food in the Last Year: Not on file  Transportation Needs:   . Lack of Transportation (Medical): Not on file  . Lack of Transportation (Non-Medical): Not on file  Physical Activity:   . Days of Exercise per Week: Not on file  . Minutes of Exercise per Session: Not on file  Stress:   . Feeling of Stress : Not on file  Social Connections:   . Frequency of Communication with Friends and Family: Not on file  . Frequency of Social Gatherings with Friends and Family: Not on file  . Attends Religious Services: Not on file  . Active Member of  Clubs or Organizations: Not on file  . Attends Archivist Meetings: Not on file  . Marital Status: Not on file  Intimate Partner Violence:   . Fear of Current or Ex-Partner: Not on file  . Emotionally Abused: Not on file  . Physically Abused: Not on file  . Sexually Abused: Not on file     PHYSICAL EXAM:  VS: BP 128/86   Pulse 85   Ht 5\' 4"  (1.626 m)   Wt 128 lb (58.1 kg)   BMI 21.97 kg/m  Physical Exam Gen: NAD, alert, cooperative with exam, well-appearing MSK:  Left shoulder: No ecchymosis or swelling. Normal external rotation. Pain with and external rotation. Pain with external rotation and abduction. Able to hold empty can testing with some pain. No pain with speeds testing. Pain with O'Brien's test. Normal elbow range of motion. Normal wrist range of motion with no ecchymosis or swelling. Neurovascularly intact     ASSESSMENT & PLAN:   Capsulitis of left shoulder Initial injury on 8/13.  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  Capsule seems to be irritated from the trauma.  No suggestion of rotator cuff tear on exam. -Counseled on home exercise therapy and supportive care. -Counseled on sling. -Provided work note. -Could consider physical therapy or injection.

## 2020-06-13 NOTE — Patient Instructions (Signed)
Good to see you Please continue ice  Please try the sling for one week  Please try compression for running  Please try the exercises   Please send me a message in MyChart with any questions or updates.  Please see me back in 4 weeks.   --Dr. Raeford Razor

## 2020-06-20 MED FILL — HYDROCORTISONE-PRAMOXINE CR: 2.5-1 | 7 days supply | Qty: 28 | Fill #2

## 2020-06-20 MED FILL — METHOCARBAMOL 500 MG TABS: 500 | 15 days supply | Qty: 60 | Fill #2

## 2020-06-20 MED FILL — BUTALB-ACETAMIN-CAFF 50-325: 50-325-40 | 18 days supply | Qty: 75 | Fill #2

## 2020-07-02 ENCOUNTER — Telehealth: Payer: 59 | Admitting: Emergency Medicine

## 2020-07-02 DIAGNOSIS — R300 Dysuria: Secondary | ICD-10-CM

## 2020-07-02 MED ORDER — PHENAZOPYRIDINE HCL 200 MG PO TABS: 200.0000 mg | ORAL_TABLET | Freq: Three times a day (TID) | ORAL | 0 refills | Status: DC | PRN

## 2020-07-02 MED ORDER — CEPHALEXIN 500 MG PO CAPS: 500.0000 mg | ORAL_CAPSULE | Freq: Two times a day (BID) | ORAL | 0 refills | Status: AC

## 2020-07-02 NOTE — Progress Notes (Signed)
Time spent: 10 min  We are sorry that you are not feeling well.  Here is how we plan to help!  Based on what you shared with me it looks like you most likely have a simple urinary tract infection.  A UTI (Urinary Tract Infection) is a bacterial infection of the bladder.  Most cases of urinary tract infections are simple to treat but a key part of your care is to encourage you to drink plenty of fluids and watch your symptoms carefully.  I have prescribed Keflex 500 mg twice a day for 7 days.  Your symptoms should gradually improve. Call us if the burning in your urine worsens, you develop worsening fever, back pain or pelvic pain or if your symptoms do not resolve after completing the antibiotic.  I have also prescribed pyridium which is a medicine that helps with burning with urination.  This medicine will make your urine bright orange (like gatorade) but this will resolve once 1-2 days after stopping this medicine.   Urinary tract infections can be prevented by drinking plenty of water to keep your body hydrated.  Also be sure when you wipe, wipe from front to back and don't hold it in!  If possible, empty your bladder every 4 hours.  Your e-visit answers were reviewed by a board certified advanced clinical practitioner to complete your personal care plan.  Depending on the condition, your plan could have included both over the counter or prescription medications.  If there is a problem please reply  once you have received a response from your provider.  Your safety is important to [place].  If you have drug allergies check your prescription carefully.    You can use MyChart to ask questions about today's visit, request a non-urgent call back, or ask for a work or school excuse for 24 hours related to this e-Visit. If it has been greater than 24 hours you will need to follow up with your provider, or enter a new e-Visit to address those concerns.   You will get an e-mail in the next two days  asking about your experience.  I hope that your e-visit has been valuable and will speed your recovery. Thank you for using e-visits.  Carmon Sails, PA-C [facility]

## 2020-07-11 ENCOUNTER — Encounter: Payer: Self-pay | Admitting: Family Medicine

## 2020-07-11 ENCOUNTER — Ambulatory Visit: Payer: 59 | Admitting: Family Medicine

## 2020-07-11 ENCOUNTER — Ambulatory Visit: Payer: Self-pay

## 2020-07-11 ENCOUNTER — Other Ambulatory Visit: Payer: Self-pay

## 2020-07-11 VITALS — BP 121/82 | HR 79 | Ht 64.0 in | Wt 125.0 lb

## 2020-07-11 DIAGNOSIS — M778 Other enthesopathies, not elsewhere classified: Secondary | ICD-10-CM

## 2020-07-11 NOTE — Patient Instructions (Signed)
Good to see you Please work on the exercises  Please use ice as needed   Please send me a message in MyChart with any questions or updates.  Please see me back in 4 weeks.   --Dr. Raeford Razor

## 2020-07-11 NOTE — Progress Notes (Signed)
Mindy [name] - 59 y.o. female MRN [id number]  Date of birth: 11/25/1960  SUBJECTIVE:  Including CC & ROS.  Chief Complaint  Patient presents with  . Follow-up    left shoulder    Mindy [name] is a 59 y.o. female that is presenting with ongoing left shoulder pain.  Still notices pain around the shoulder but has improved.  She does get radicular pain down the arm intermittently.  Symptoms are better for the most part.   Review of Systems See HPI   HISTORY: Past Medical, Surgical, Social, and Family History Reviewed & Updated per EMR.   Pertinent Historical Findings include:  Past Medical History:  Diagnosis Date  . Allergic rhinitis   . Complication of anesthesia   . Elevated transaminase level 12.2014   ?etiology - present 09/2013 hosp for epigastric pain  . Family history of adverse reaction to anesthesia    father - PONV  . GERD (gastroesophageal reflux disease)   . History of bronchitis 10/2014  . Migraine   . PONV (postoperative nausea and vomiting)   . Seasonal allergies     Past Surgical History:  Procedure Laterality Date  . BREAST BIOPSY Right   . BREAST EXCISIONAL BIOPSY    . BREAST LUMPECTOMY WITH RADIOACTIVE SEED LOCALIZATION Right 10/08/2016   Procedure: RIGHT BREAST LUMPECTOMY WITH RADIOACTIVE SEED LOCALIZATION;  Surgeon: Coralie Keens, MD;  Location: [facility];  Service: General;  Laterality: Right;  . CHOLECYSTECTOMY N/A 09/21/2015   Procedure: LAPAROSCOPIC CHOLECYSTECTOMY;  Surgeon: Coralie Keens, MD;  Location: [facility];  Service: General;  Laterality: N/A;  . COLONOSCOPY  12/2013  . NECK SURGERY  2009  . PARTIAL HYSTERECTOMY  2007  . UPPER GI ENDOSCOPY  2016    Family History  Problem Relation Age of Onset  . Atrial fibrillation Father   . Diabetes type II Father   . Hypertension Father   . Coronary artery disease Father 72       stent x 2, AoVR  . Hypertension Mother   . Dementia Mother   . Hypertension Brother   . Breast cancer Maternal  Aunt   . Breast cancer Maternal Grandmother   . Breast cancer Paternal Grandmother     Social History   Socioeconomic History  . Marital status: Divorced    Spouse name: Not on file  . Number of children: Not on file  . Years of education: Not on file  . Highest education level: Not on file  Occupational History  . Occupation: Therapist, sports  Tobacco Use  . Smoking status: Never Smoker  . Smokeless tobacco: Never Used  Vaping Use  . Vaping Use: Never used  Substance and Sexual Activity  . Alcohol use: Yes    Comment: occ  . Drug use: No  . Sexual activity: Not on file  Other Topics Concern  . Not on file  Social History Narrative   Exercise: on average 3/week - running, weights sometimes   Social Determinants of Health   Financial Resource Strain:   . Difficulty of Paying Living Expenses: Not on file  Food Insecurity:   . Worried About Charity fundraiser in the Last Year: Not on file  . Ran Out of Food in the Last Year: Not on file  Transportation Needs:   . Lack of Transportation (Medical): Not on file  . Lack of Transportation (Non-Medical): Not on file  Physical Activity:   . Days of Exercise per Week: Not on file  . Minutes  of Exercise per Session: Not on file  Stress:   . Feeling of Stress : Not on file  Social Connections:   . Frequency of Communication with Friends and Family: Not on file  . Frequency of Social Gatherings with Friends and Family: Not on file  . Attends Religious Services: Not on file  . Active Member of Clubs or Organizations: Not on file  . Attends Archivist Meetings: Not on file  . Marital Status: Not on file  Intimate Partner Violence:   . Fear of Current or Ex-Partner: Not on file  . Emotionally Abused: Not on file  . Physically Abused: Not on file  . Sexually Abused: Not on file     PHYSICAL EXAM:  VS: BP 121/82   Pulse 79   Ht 5\' 4"  (1.626 m)   Wt 125 lb (56.7 kg)   BMI 21.46 kg/m  Physical Exam Gen: NAD, alert,  cooperative with exam, well-appearing MSK:  Left shoulder: Normal external rotation. Normal empty can testing. Normal speeds test. Neurovascularly intact  Limited ultrasound: Left shoulder:  Normal-appearing biceps tendon. Normal-appearing subscapularis. Normal-appearing supraspinatus and static and dynamic testing. No changes in the posterior glenohumeral joint.  Summary: No structural changes observed.  Ultrasound and interpretation by Clearance Coots, MD    ASSESSMENT & PLAN:   Capsulitis of left shoulder Initial injury on 8/13.  Symptoms have improved.  Still notices pain with minor movements.  Does have radicular type pain intermittently. -Counseled on home exercise therapy and supportive care. -Could consider injection or physical therapy.

## 2020-07-11 NOTE — Assessment & Plan Note (Signed)
Initial injury on 8/13.  Symptoms have improved.  Still notices pain with minor movements.  Does have radicular type pain intermittently. -Counseled on home exercise therapy and supportive care. -Could consider injection or physical therapy.

## 2020-07-21 ENCOUNTER — Other Ambulatory Visit: Payer: Self-pay | Admitting: Internal Medicine

## 2020-07-21 MED FILL — BUTALB-ACETAMIN-CAFF 50-325: 50-325-40 | 18 days supply | Qty: 75 | Fill #3

## 2020-07-21 MED FILL — ELETRIPTAN HYDROBROMIDE 40: 40 | 25 days supply | Qty: 10 | Fill #7

## 2020-07-21 MED FILL — DEXILANT DR 30 MG CAPSULE: 30 | 90 days supply | Qty: 90 | Fill #0

## 2020-08-03 ENCOUNTER — Ambulatory Visit: Payer: 59 | Admitting: Internal Medicine

## 2020-08-03 ENCOUNTER — Other Ambulatory Visit: Payer: Self-pay

## 2020-08-03 ENCOUNTER — Encounter: Payer: Self-pay | Admitting: Internal Medicine

## 2020-08-03 DIAGNOSIS — M778 Other enthesopathies, not elsewhere classified: Secondary | ICD-10-CM

## 2020-08-03 DIAGNOSIS — J300 Vasomotor rhinitis: Secondary | ICD-10-CM

## 2020-08-03 NOTE — Assessment & Plan Note (Signed)
Chronic variable. We've done what we could with meds, ENT evaluation. Plan- no changes

## 2020-08-03 NOTE — Assessment & Plan Note (Signed)
Recent fall, evaluated for shoulder pain. Can affect sleep.

## 2020-08-03 NOTE — Patient Instructions (Signed)
We can continue current meds  Please call if we can help 

## 2020-08-03 NOTE — Progress Notes (Signed)
Patient ID: Mindy [name], female    DOB: 1961/08/03, 59 y.o.   MRN: [id number]  HPI female never smoker followed for chronic rhinitis/sinusitis, complicated by headache Allergy profile 12/27/13- Neg, Total IgE 4.5 Had failed allergy vaccine Eos not elevated 09/12/14 Sinus xray 12/12/2015-no evidence of acute sinusitis, clear -------------------------------------------------------------------  07/29/2019- 59 year old female never smoker followed for chronic rhinitis/sinusitis, complicated by Migraine, superficial thrombophlebitis, Cervical disc disease, GERD,  Fioricet, sudafed,  ------Rhinitis; pt states her symptoms are at baseline; would like flu shot today Frequent headaches- migraine, sinus congestion, and tension. Recognizes some are eyestrain-induced, from prolonged screen time working from home.  Uses Excedrin, then Fioricet, then Relpax, based on need. No recent infection or seasonal symptom flare so far. Denies wheeze or cough.  08/03/20- 59 year old female never smoker followed for chronic rhinitis/sinusitis, complicated by Migraine, superficial thrombophlebitis, Cervical disc disease, GERD,  Fioricet, sudafed, Ventolin hfa, Flonase/ Astelin or Dymista,       Relpax, Norco, Robaxin, Diclofenac, Pamelor,  Covid vax- 3 Phizer Flu vax- had ------1 yr f/u - c/o seasonal nasal stuffiness - Denies cough wheeze or sob Seasonal stuffiness. Reviewed her experience with available medication classes. She likes Dymista better than the components, but insurance won't cover. Suggested looking into GoodRX.  No wheeze/ cough.  Review of Systems- see HPI   +  = positive Constitutional:   No-   weight loss, night sweats, fevers, chills, fatigue, lassitude. HEENT:   +  headaches,  No-difficulty swallowing, tooth/dental problems, sore throat,       sneezing, itching, ear ache, +nasal congestion, post nasal drip,  CV:  chest pain, orthopnea, PND, swelling in lower extremities, anasarca,  dizziness, palpitations Resp: shortness of breath with exertion or at rest.              No-   productive cough,  non-productive cough,  No-  coughing up of blood.              No-   change in color of mucus.  No- wheezing.   Skin: No-   rash or lesions. GI:  No-   heartburn, indigestion, abdominal pain, nausea, vomiting,  GU:  MS:  No-   joint pain or swelling.   Neuro- nothing unusual Psych:  No- change in mood or affect. No depression or anxiety.  No memory loss.  Objective:   Physical Exam General- Alert, Oriented, Affect-appropriate, Distress- none  Skin- rash-none, lesions- none, excoriation- none Lymphadenopathy- none Head- atraumatic            Eyes- Gross vision intact, PERRLA, conjunctivae clear secretions            Ears- Hearing, canals            Nose- , +turbinate edema, +Septal dev, no-mucus,  No-polyps, erosion, perforation.     Masked            Throat- Mallampati III-IV , mucosa clear , drainage- none, tonsils- atrophic Neck- flexible , trachea midline, no stridor , thyroid nl, carotid no bruit Chest - symmetrical excursion , unlabored           Heart/CV- RRR , no murmur , no gallop  , no rub, nl s1 s2                           - JVD- none , edema- none, stasis changes- none, varices- none  Lung-  wheeze -none, cough-none , dullness-none, rub- none           Chest wall-  Abd-  Br/ Gen/ Rectal- Not done, not indicated Extrem- cyanosis- none, clubbing, none, atrophy- none, strength- nl Neuro- grossly intact to observation

## 2020-08-08 ENCOUNTER — Telehealth: Payer: 59 | Admitting: Nurse Practitioner

## 2020-08-08 ENCOUNTER — Other Ambulatory Visit: Payer: Self-pay | Admitting: Nurse Practitioner

## 2020-08-08 DIAGNOSIS — B9789 Other viral agents as the cause of diseases classified elsewhere: Secondary | ICD-10-CM | POA: Diagnosis not present

## 2020-08-08 DIAGNOSIS — J329 Chronic sinusitis, unspecified: Secondary | ICD-10-CM

## 2020-08-08 MED ORDER — FLUTICASONE PROPIONATE 50 MCG/ACT NA SUSP: 2.0000 | Freq: Every day | NASAL | 6 refills | Status: DC

## 2020-08-08 NOTE — Progress Notes (Signed)

## 2020-08-09 ENCOUNTER — Other Ambulatory Visit (HOSPITAL_COMMUNITY): Payer: Self-pay | Admitting: Obstetrics and Gynecology

## 2020-08-09 DIAGNOSIS — Z01419 Encounter for gynecological examination (general) (routine) without abnormal findings: Secondary | ICD-10-CM | POA: Diagnosis not present

## 2020-08-09 MED FILL — PREMARIN 0.9 MG TABLET: 0.9 | 90 days supply | Qty: 90 | Fill #0

## 2020-08-09 MED FILL — FLUTICASONE PROP 50 MCG SPR: 50 | 30 days supply | Qty: 16 | Fill #0

## 2020-08-10 ENCOUNTER — Ambulatory Visit: Payer: 59 | Admitting: Family Medicine

## 2020-08-10 ENCOUNTER — Other Ambulatory Visit: Payer: Self-pay

## 2020-08-10 ENCOUNTER — Encounter: Payer: Self-pay | Admitting: Family Medicine

## 2020-08-10 DIAGNOSIS — S43432D Superior glenoid labrum lesion of left shoulder, subsequent encounter: Secondary | ICD-10-CM

## 2020-08-10 NOTE — Patient Instructions (Signed)
Good to see you Please use ice as needed   Please send me a message in MyChart with any questions or updates.  We will setup the virtual visit once the MRI is resulted.   --Dr. Raeford Razor

## 2020-08-10 NOTE — Assessment & Plan Note (Addendum)
Initial injury 8/13.  She feels better but she does not feel back to her normal self.  She has tensive pain and instability when she is reaching out lifting things up.  Concerned that she has a labral tear given her injury 2 months ago. -Counseled on home exercise therapy and supportive care. -Work note provided. -MRI to evaluate for labral tear.

## 2020-08-10 NOTE — Progress Notes (Signed)
Mindy [name] - 59 y.o. female MRN [id number]  Date of birth: December 15, 1960  SUBJECTIVE:  Including CC & ROS.  Chief Complaint  Patient presents with  . Follow-up    left shoulder    Mindy [name] is a 59 y.o. female that is following up with her left shoulder pain.  Her initial injury was on 8/13.  She is still having pain when she is in external rotation as well as abduction.  She has instability when she is out in this area.  She has trouble reaching and lifting things up when she is in full abduction.   Review of Systems See HPI   HISTORY: Past Medical, Surgical, Social, and Family History Reviewed & Updated per EMR.   Pertinent Historical Findings include:  Past Medical History:  Diagnosis Date  . Allergic rhinitis   . Complication of anesthesia   . Elevated transaminase level 12.2014   ?etiology - present 09/2013 hosp for epigastric pain  . Family history of adverse reaction to anesthesia    father - PONV  . GERD (gastroesophageal reflux disease)   . History of bronchitis 10/2014  . Migraine   . PONV (postoperative nausea and vomiting)   . Seasonal allergies     Past Surgical History:  Procedure Laterality Date  . BREAST BIOPSY Right   . BREAST EXCISIONAL BIOPSY    . BREAST LUMPECTOMY WITH RADIOACTIVE SEED LOCALIZATION Right 10/08/2016   Procedure: RIGHT BREAST LUMPECTOMY WITH RADIOACTIVE SEED LOCALIZATION;  Surgeon: Coralie Keens, MD;  Location: [facility];  Service: General;  Laterality: Right;  . CHOLECYSTECTOMY N/A 09/21/2015   Procedure: LAPAROSCOPIC CHOLECYSTECTOMY;  Surgeon: Coralie Keens, MD;  Location: [facility];  Service: General;  Laterality: N/A;  . COLONOSCOPY  12/2013  . NECK SURGERY  2009  . PARTIAL HYSTERECTOMY  2007  . UPPER GI ENDOSCOPY  2016    Family History  Problem Relation Age of Onset  . Atrial fibrillation Father   . Diabetes type II Father   . Hypertension Father   . Coronary artery disease Father 47       stent x 2, AoVR  . Hypertension  Mother   . Dementia Mother   . Hypertension Brother   . Breast cancer Maternal Aunt   . Breast cancer Maternal Grandmother   . Breast cancer Paternal Grandmother     Social History   Socioeconomic History  . Marital status: Divorced    Spouse name: Not on file  . Number of children: Not on file  . Years of education: Not on file  . Highest education level: Not on file  Occupational History  . Occupation: Therapist, sports  Tobacco Use  . Smoking status: Never Smoker  . Smokeless tobacco: Never Used  Vaping Use  . Vaping Use: Never used  Substance and Sexual Activity  . Alcohol use: Yes    Comment: occ  . Drug use: No  . Sexual activity: Not on file  Other Topics Concern  . Not on file  Social History Narrative   Exercise: on average 3/week - running, weights sometimes   Social Determinants of Health   Financial Resource Strain:   . Difficulty of Paying Living Expenses: Not on file  Food Insecurity:   . Worried About Charity fundraiser in the Last Year: Not on file  . Ran Out of Food in the Last Year: Not on file  Transportation Needs:   . Lack of Transportation (Medical): Not on file  . Lack of  Transportation (Non-Medical): Not on file  Physical Activity:   . Days of Exercise per Week: Not on file  . Minutes of Exercise per Session: Not on file  Stress:   . Feeling of Stress : Not on file  Social Connections:   . Frequency of Communication with Friends and Family: Not on file  . Frequency of Social Gatherings with Friends and Family: Not on file  . Attends Religious Services: Not on file  . Active Member of Clubs or Organizations: Not on file  . Attends Archivist Meetings: Not on file  . Marital Status: Not on file  Intimate Partner Violence:   . Fear of Current or Ex-Partner: Not on file  . Emotionally Abused: Not on file  . Physically Abused: Not on file  . Sexually Abused: Not on file     PHYSICAL EXAM:  VS: BP 131/90   Pulse 70   Ht 5\' 4"  (1.626 m)    Wt 125 lb (56.7 kg)   BMI 21.46 kg/m  Physical Exam Gen: NAD, alert, cooperative with exam, well-appearing MSK:  Left shoulder: Normal active range of motion. Normal strength resistance. Normal empty can test. Pain exacerbated with internal rotation and abduction. Positive O'Brien's test. Neurovascularly intact     ASSESSMENT & PLAN:   Labral tear of shoulder Initial injury 8/13.  She feels better but she does not feel back to her normal self.  She has tensive pain and instability when she is reaching out lifting things up.  Concerned that she has a labral tear given her injury 2 months ago. -Counseled on home exercise therapy and supportive care. -Work note provided. -MRI to evaluate for labral tear.

## 2020-08-21 MED FILL — BUTALB-ACETAMIN-CAFF 50-325: 50-325-40 | 18 days supply | Qty: 75 | Fill #4

## 2020-08-21 MED FILL — ELETRIPTAN HYDROBROMIDE 40: 40 | 25 days supply | Qty: 10 | Fill #8

## 2020-08-21 MED FILL — LINZESS 72 MCG CAPSULE: 72 | 90 days supply | Qty: 90 | Fill #1

## 2020-08-28 ENCOUNTER — Other Ambulatory Visit: Payer: Self-pay | Admitting: Internal Medicine

## 2020-08-28 MED FILL — METHOCARBAMOL 500 MG TABS: 500 | 15 days supply | Qty: 60 | Fill #0

## 2020-08-29 ENCOUNTER — Ambulatory Visit
Admission: RE | Admit: 2020-08-29 | Discharge: 2020-08-29 | Disposition: A | Discharge status: Home / Self Care | Payer: 59 | Source: Ambulatory Visit | Attending: Family Medicine | Admitting: Family Medicine

## 2020-08-29 ENCOUNTER — Other Ambulatory Visit: Payer: Self-pay

## 2020-08-29 DIAGNOSIS — S43432D Superior glenoid labrum lesion of left shoulder, subsequent encounter: Secondary | ICD-10-CM

## 2020-08-29 DIAGNOSIS — M25512 Pain in left shoulder: Secondary | ICD-10-CM | POA: Diagnosis not present

## 2020-08-30 ENCOUNTER — Telehealth: Payer: Self-pay | Admitting: Family Medicine

## 2020-08-30 NOTE — Telephone Encounter (Signed)
Called pt to schedule MRI review appt--no answer, message left--glh

## 2020-09-01 ENCOUNTER — Telehealth (INDEPENDENT_AMBULATORY_CARE_PROVIDER_SITE_OTHER): Payer: 59 | Admitting: Family Medicine

## 2020-09-01 ENCOUNTER — Other Ambulatory Visit: Payer: Self-pay

## 2020-09-01 DIAGNOSIS — M7552 Bursitis of left shoulder: Secondary | ICD-10-CM | POA: Diagnosis not present

## 2020-09-01 NOTE — Progress Notes (Signed)
Virtual Visit via Telephone Note  I connected with Sanjuana Mae on 09/01/20 at  8:00 AM EST by telephone and verified that I am speaking with the correct person using two identifiers.  Location: Patient: home Provider: office   I discussed the limitations, risks, security and privacy concerns of performing an evaluation and management service by telephone and the availability of in person appointments. I also discussed with the patient that there may be a patient responsible charge related to this service. The patient expressed understanding and agreed to proceed.   History of Present Illness:  Ms. Bezio is a 59 year old female that is following up after the MRI of her left shoulder.  This was revealing for subacromial bursitis as well as tendinopathy of the supraspinatus and infraspinatus.   Observations/Objective:   Assessment and Plan:  Subacromial bursitis: MRI was revealing for tendinopathy and bursitis. -Counseled on home exercise therapy and supportive care. -We will pursue injection.  Follow Up Instructions:    I discussed the assessment and treatment plan with the patient. The patient was provided an opportunity to ask questions and all were answered. The patient agreed with the plan and demonstrated an understanding of the instructions.   The patient was advised to call back or seek an in-person evaluation if the symptoms worsen or if the condition fails to improve as anticipated.  I provided 5 minutes of non-face-to-face time during this encounter.   Clearance Coots, MD

## 2020-09-01 NOTE — Assessment & Plan Note (Signed)
MRI was revealing for tendinopathy and bursitis. -Counseled on home exercise therapy and supportive care. -We will pursue injection.

## 2020-09-04 ENCOUNTER — Ambulatory Visit: Payer: 59 | Admitting: Family Medicine

## 2020-09-05 ENCOUNTER — Ambulatory Visit: Payer: 59 | Admitting: Family Medicine

## 2020-09-05 ENCOUNTER — Other Ambulatory Visit: Payer: Self-pay

## 2020-09-05 ENCOUNTER — Encounter: Payer: Self-pay | Admitting: Family Medicine

## 2020-09-05 ENCOUNTER — Ambulatory Visit: Payer: Self-pay

## 2020-09-05 VITALS — BP 125/82 | HR 64 | Ht 64.0 in | Wt 125.0 lb

## 2020-09-05 DIAGNOSIS — M7552 Bursitis of left shoulder: Secondary | ICD-10-CM | POA: Diagnosis not present

## 2020-09-05 MED ORDER — METHYLPREDNISOLONE ACETATE 40 MG/ML IJ SUSP
40.0000 mg | Freq: Once | INTRAMUSCULAR | Status: AC
  Administered 2020-09-05: 40 mg via INTRA_ARTICULAR

## 2020-09-05 NOTE — Patient Instructions (Signed)
Good to see you Please try ice  Please continue the exercises   Please send me a message in MyChart with any questions or updates.  Please see me back in 4 weeks.   --Dr. Raeford Razor

## 2020-09-05 NOTE — Progress Notes (Signed)
Mindy [name] - 59 y.o. female MRN [id number]  Date of birth: 1961-09-08  SUBJECTIVE:  Including CC & ROS.  Chief Complaint  Patient presents with  . Follow-up    left shoulder    Mindy [name] is a 59 y.o. female that is presenting with ongoing left shoulder pain. Here to have injection.    Review of Systems See HPI   HISTORY: Past Medical, Surgical, Social, and Family History Reviewed & Updated per EMR.   Pertinent Historical Findings include:  Past Medical History:  Diagnosis Date  . Allergic rhinitis   . Complication of anesthesia   . Elevated transaminase level 12.2014   ?etiology - present 09/2013 hosp for epigastric pain  . Family history of adverse reaction to anesthesia    father - PONV  . GERD (gastroesophageal reflux disease)   . History of bronchitis 10/2014  . Migraine   . PONV (postoperative nausea and vomiting)   . Seasonal allergies     Past Surgical History:  Procedure Laterality Date  . BREAST BIOPSY Right   . BREAST EXCISIONAL BIOPSY    . BREAST LUMPECTOMY WITH RADIOACTIVE SEED LOCALIZATION Right 10/08/2016   Procedure: RIGHT BREAST LUMPECTOMY WITH RADIOACTIVE SEED LOCALIZATION;  Surgeon: Coralie Keens, MD;  Location: [facility];  Service: General;  Laterality: Right;  . CHOLECYSTECTOMY N/A 09/21/2015   Procedure: LAPAROSCOPIC CHOLECYSTECTOMY;  Surgeon: Coralie Keens, MD;  Location: [facility];  Service: General;  Laterality: N/A;  . COLONOSCOPY  12/2013  . NECK SURGERY  2009  . PARTIAL HYSTERECTOMY  2007  . UPPER GI ENDOSCOPY  2016    Family History  Problem Relation Age of Onset  . Atrial fibrillation Father   . Diabetes type II Father   . Hypertension Father   . Coronary artery disease Father 36       stent x 2, AoVR  . Hypertension Mother   . Dementia Mother   . Hypertension Brother   . Breast cancer Maternal Aunt   . Breast cancer Maternal Grandmother   . Breast cancer Paternal Grandmother     Social History   Socioeconomic History    . Marital status: Divorced    Spouse name: Not on file  . Number of children: Not on file  . Years of education: Not on file  . Highest education level: Not on file  Occupational History  . Occupation: Therapist, sports  Tobacco Use  . Smoking status: Never Smoker  . Smokeless tobacco: Never Used  Vaping Use  . Vaping Use: Never used  Substance and Sexual Activity  . Alcohol use: Yes    Comment: occ  . Drug use: No  . Sexual activity: Not on file  Other Topics Concern  . Not on file  Social History Narrative   Exercise: on average 3/week - running, weights sometimes   Social Determinants of Health   Financial Resource Strain:   . Difficulty of Paying Living Expenses: Not on file  Food Insecurity:   . Worried About Charity fundraiser in the Last Year: Not on file  . Ran Out of Food in the Last Year: Not on file  Transportation Needs:   . Lack of Transportation (Medical): Not on file  . Lack of Transportation (Non-Medical): Not on file  Physical Activity:   . Days of Exercise per Week: Not on file  . Minutes of Exercise per Session: Not on file  Stress:   . Feeling of Stress : Not on file  Social Connections:   .  Frequency of Communication with Friends and Family: Not on file  . Frequency of Social Gatherings with Friends and Family: Not on file  . Attends Religious Services: Not on file  . Active Member of Clubs or Organizations: Not on file  . Attends Archivist Meetings: Not on file  . Marital Status: Not on file  Intimate Partner Violence:   . Fear of Current or Ex-Partner: Not on file  . Emotionally Abused: Not on file  . Physically Abused: Not on file  . Sexually Abused: Not on file     PHYSICAL EXAM:  VS: BP 125/82   Pulse 64   Ht 5\' 4"  (1.626 m)   Wt 125 lb (56.7 kg)   BMI 21.46 kg/m  Physical Exam Gen: NAD, alert, cooperative with exam, well-appearing    Aspiration/Injection Procedure Note Mindy [name] 1960-10-29  Procedure:  Injection Indications: left shoulder pain   Procedure Details Consent: Risks of procedure as well as the alternatives and risks of each were explained to the (patient/caregiver).  Consent for procedure obtained. Time Out: Verified patient identification, verified procedure, site/side was marked, verified correct patient position, special equipment/implants available, medications/allergies/relevent history reviewed, required imaging and test results available.  Performed.  The area was cleaned with iodine and alcohol swabs.    The left subacromial space was injected using 1 cc's of 40 mg depo-medrol and 4 cc's of 0.25% bupivacaine with a 22 1 1/2" needle.  Ultrasound was used. Images were obtained in long views showing the injection.     A sterile dressing was applied.  Patient did tolerate procedure well.      ASSESSMENT & PLAN:   Subacromial bursitis of left shoulder joint Pain has been ongoing. MRI was revealing for bursitis.  - counseled on home exercise therapy and supportive care - injection  - could consider physical therapy

## 2020-09-05 NOTE — Assessment & Plan Note (Signed)
Pain has been ongoing. MRI was revealing for bursitis.  - counseled on home exercise therapy and supportive care - injection  - could consider physical therapy

## 2020-09-10 DIAGNOSIS — Z833 Family history of diabetes mellitus: Secondary | ICD-10-CM | POA: Insufficient documentation

## 2020-09-10 NOTE — Progress Notes (Signed)
Subjective:    Patient ID: Mindy [name], female    DOB: August 24, 1961, 59 y.o.   MRN: [id number]   This visit occurred during the SARS-CoV-2 public health emergency.  Safety protocols were in place, including screening questions prior to the visit, additional usage of staff PPE, and extensive cleaning of exam room while observing appropriate contact time as indicated for disinfecting solutions.    HPI She is here for a physical exam.   She has had worse migraines/headaches.  It was around the time change.   She would get a bad pain in the posterior head and a couple were the worse headaches she has had. She has associated visual change.  The relpax has helped.  The headaches are getting better.   A little bladder leakage with robaxin.  Medications and allergies reviewed with patient and updated if appropriate.  Patient Active Problem List   Diagnosis Date Noted  . Family history of diabetes mellitus in father 09/10/2020  . Subacromial bursitis of left shoulder joint 06/13/2020  . Patellofemoral pain syndrome of left knee 03/23/2020  . Cervical disc disorder with radiculopathy of cervical region 10/12/2018  . Hyperlipidemia 09/07/2018  . Hip injury, right, initial encounter 07/03/2018  . Right hip pain 05/07/2018  . Sleep difficulties 06/06/2017  . Constipation 06/06/2017  . Insomnia 04/03/2017  . 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 Low back pain 02/13/2017  . Right sided sciatica 02/13/2017  . RLQ abdominal pain 08/20/2016  . Anal itching 08/20/2016  . GERD (gastroesophageal reflux disease) 06/05/2016  . Migraine 12/27/2015  . Headache 12/27/2015  . Lateral epicondylitis of left elbow 06/27/2015  . Cervical radiculitis 06/27/2015  . 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 albuterol (VENTOLIN HFA) 108 (90 Base) MCG/ACT inhaler Inhale 2  puffs into the lungs every 4 (four) hours as needed for wheezing or shortness of breath. 18 g 0  . butalbital-acetaminophen-caffeine (FIORICET) 50-325-40 MG tablet Take 1 tablet by mouth every 6 (six) hours as needed for headache. 75 tablet 5  . Dexlansoprazole (DEXILANT) 30 MG capsule Take 1 capsule (30 mg total) by mouth daily. Annual appt due in Nov must see provider for future refills 90 capsule 0  . eletriptan (RELPAX) 40 MG tablet TAKE 1 TABLET BY MOUTH AS NEEDED FOR MIGRAINE OR HEADACHE. MAY REPEAT IN 2 HOURS IF HEADACHE PERSISTS OR RECURS 10 tablet 8  . estrogens, conjugated, (PREMARIN) 0.625 MG tablet Take 0.9 mg by mouth daily.     . fluticasone (FLONASE) 50 MCG/ACT nasal spray Place 2 sprays into both nostrils daily. 16 g 6  . linaclotide (LINZESS) 72 MCG capsule Take 1 capsule (72 mcg total) by mouth daily before breakfast. 90 capsule 1  . methocarbamol (ROBAXIN) 500 MG tablet TAKE 1 TABLET BY MOUTH EVERY 6 HOURS AS NEEDED FOR MUSCLE SPASMS 60 tablet 2  . Pramoxine-HC (HYDROCORTISONE ACE-PRAMOXINE) 2.5-1 % CREA Apply TID-QID 57 g 2  . PREMARIN 0.9 MG tablet Take 0.9 mg by mouth daily.    . pseudoephedrine (SUDAFED) 30 MG tablet Per box as needed    . sodium chloride (OCEAN) 0.65 % SOLN nasal spray Place 1 spray into both nostrils as needed for congestion. 50 mL 0  . triamcinolone (KENALOG) 0.1 % Apply topically 2 (two) times daily.    Marland Kitchen azelastine (ASTELIN) 0.1 % nasal spray Place 1-2 sprays into both nostrils  2 (two) times daily. Use in each nostril as directed 30 mL prn   No current facility-administered medications on file prior to visit.    Past Medical History:  Diagnosis Date  . Allergic rhinitis   . Complication of anesthesia   . Elevated transaminase level 12.2014   ?etiology - present 09/2013 hosp for epigastric pain  . Family history of adverse reaction to anesthesia    father - PONV  . GERD (gastroesophageal reflux disease)   . History of bronchitis 10/2014  .  Migraine   . PONV (postoperative nausea and vomiting)   . Seasonal allergies     Past Surgical History:  Procedure Laterality Date  . BREAST BIOPSY Right   . BREAST EXCISIONAL BIOPSY    . BREAST LUMPECTOMY WITH RADIOACTIVE SEED LOCALIZATION Right 10/08/2016   Procedure: RIGHT BREAST LUMPECTOMY WITH RADIOACTIVE SEED LOCALIZATION;  Surgeon: Coralie Keens, MD;  Location: [facility];  Service: General;  Laterality: Right;  . CHOLECYSTECTOMY N/A 09/21/2015   Procedure: LAPAROSCOPIC CHOLECYSTECTOMY;  Surgeon: Coralie Keens, MD;  Location: [facility];  Service: General;  Laterality: N/A;  . COLONOSCOPY  12/2013  . NECK SURGERY  2009  . PARTIAL HYSTERECTOMY  2007  . UPPER GI ENDOSCOPY  2016    Social History   Socioeconomic History  . Marital status: Divorced    Spouse name: Not on file  . Number of children: Not on file  . Years of education: Not on file  . Highest education level: Not on file  Occupational History  . Occupation: Therapist, sports  Tobacco Use  . Smoking status: Never Smoker  . Smokeless tobacco: Never Used  Vaping Use  . Vaping Use: Never used  Substance and Sexual Activity  . Alcohol use: Yes    Comment: occ  . Drug use: No  . Sexual activity: Not on file  Other Topics Concern  . Not on file  Social History Narrative   Exercise: on average 3/week - running, weights sometimes   Social Determinants of Health   Financial Resource Strain:   . Difficulty of Paying Living Expenses: Not on file  Food Insecurity:   . Worried About Charity fundraiser in the Last Year: Not on file  . Ran Out of Food in the Last Year: Not on file  Transportation Needs:   . Lack of Transportation (Medical): Not on file  . Lack of Transportation (Non-Medical): Not on file  Physical Activity:   . Days of Exercise per Week: Not on file  . Minutes of Exercise per Session: Not on file  Stress:   . Feeling of Stress : Not on file  Social Connections:   . Frequency of Communication with Friends and  Family: Not on file  . Frequency of Social Gatherings with Friends and Family: Not on file  . Attends Religious Services: Not on file  . Active Member of Clubs or Organizations: Not on file  . Attends Archivist Meetings: Not on file  . Marital Status: Not on file    Family History  Problem Relation Age of Onset  . Atrial fibrillation Father   . Diabetes type II Father   . Hypertension Father   . Coronary artery disease Father 66       stent x 2, AoVR  . Hypertension Mother   . Dementia Mother   . Hypertension Brother   . Breast cancer Maternal Aunt   . Breast cancer Maternal Grandmother   . Breast cancer Paternal Grandmother  Review of Systems  Constitutional: Negative for chills and fever.  Eyes: Negative for visual disturbance.  Respiratory: Positive for cough (PND). Negative for shortness of breath and wheezing.   Cardiovascular: Negative for chest pain and leg swelling.  Gastrointestinal: Negative for abdominal pain, blood in stool, constipation, diarrhea and nausea.       Gerd controlled  Genitourinary: Negative for dysuria and hematuria.  Musculoskeletal: Positive for arthralgias (L shoulder ). Negative for back pain.  Skin: Positive for rash (eczema). Negative for color change.  Neurological: Positive for headaches. Negative for light-headedness.  Psychiatric/Behavioral: Positive for sleep disturbance (related to hot flashes). Negative for dysphoric mood. The patient is not nervous/anxious.        Objective:   Vitals:   09/11/20 1546  BP: 122/78  Pulse: 66  Temp: 97.9 F (36.6 C)  SpO2: 99%   Filed Weights   09/11/20 1546  Weight: 125 lb 6.4 oz (56.9 kg)   Body mass index is 21.52 kg/m.  BP Readings from Last 3 Encounters:  09/11/20 122/78  09/05/20 125/82  08/10/20 131/90    Wt Readings from Last 3 Encounters:  09/11/20 125 lb 6.4 oz (56.9 kg)  09/05/20 125 lb (56.7 kg)  08/10/20 125 lb (56.7 kg)     Physical  Exam Constitutional: She appears well-developed and well-nourished. No distress.  HENT:  Head: Normocephalic and atraumatic.  Right Ear: External ear normal. Normal ear canal and TM Left Ear: External ear normal.  Normal ear canal and TM Mouth/Throat: Oropharynx is clear and moist.  Eyes: Conjunctivae and EOM are normal.  Neck: Neck supple. No tracheal deviation present. No thyromegaly present.  No carotid bruit  Cardiovascular: Normal rate, regular rhythm and normal heart sounds.   No murmur heard.  No edema. Pulmonary/Chest: Effort normal and breath sounds normal. No respiratory distress. She has no wheezes. She has no rales.  Breast: deferred   Abdominal: Soft. She exhibits no distension. There is no tenderness.  Lymphadenopathy: She has no cervical adenopathy.  Skin: Skin is warm and dry. She is not diaphoretic.  Psychiatric: She has a normal mood and affect. Her behavior is normal.        Assessment & Plan:   Physical exam: Screening blood work    ordered Immunizations  tdap today,  shingrix #1 today Colonoscopy  Up to date  Mammogram  Up to date  Gyn  Up to date  Eye exams  Up to date  Exercise  walking Weight  normal Substance abuse  none      See Problem List for Assessment and Plan of chronic medical problems.

## 2020-09-10 NOTE — Patient Instructions (Addendum)
Blood work was ordered.     Tetanus and shingles immunization administered today.   Medications changes include :   none   Please followup in 1 year    Health Maintenance, Female Adopting a healthy lifestyle and getting preventive care are important in promoting health and wellness. Ask your health care provider about:  The right schedule for you to have regular tests and exams.  Things you can do on your own to prevent diseases and keep yourself healthy. What should I know about diet, weight, and exercise? Eat a healthy diet   Eat a diet that includes plenty of vegetables, fruits, low-fat dairy products, and lean protein.  Do not eat a lot of foods that are high in solid fats, added sugars, or sodium. Maintain a healthy weight Body mass index (BMI) is used to identify weight problems. It estimates body fat based on height and weight. Your health care provider can help determine your BMI and help you achieve or maintain a healthy weight. Get regular exercise Get regular exercise. This is one of the most important things you can do for your health. Most adults should:  Exercise for at least 150 minutes each week. The exercise should increase your heart rate and make you sweat (moderate-intensity exercise).  Do strengthening exercises at least twice a week. This is in addition to the moderate-intensity exercise.  Spend less time sitting. Even light physical activity can be beneficial. Watch cholesterol and blood lipids Have your blood tested for lipids and cholesterol at 57 years of age, then have this test every 5 years. Have your cholesterol levels checked more often if:  Your lipid or cholesterol levels are high.  You are older than 57 years of age.  You are at high risk for heart disease. What should I know about cancer screening? Depending on your health history and family history, you may need to have cancer screening at various ages. This may include screening  for:  Breast cancer.  Cervical cancer.  Colorectal cancer.  Skin cancer.  Lung cancer. What should I know about heart disease, diabetes, and high blood pressure? Blood pressure and heart disease  High blood pressure causes heart disease and increases the risk of stroke. This is more likely to develop in people who have high blood pressure readings, are of African descent, or are overweight.  Have your blood pressure checked: ? Every 3-5 years if you are 70-35 years of age. ? Every year if you are 31 years old or older. Diabetes Have regular diabetes screenings. This checks your fasting blood sugar level. Have the screening done:  Once every three years after age 51 if you are at a normal weight and have a low risk for diabetes.  More often and at a younger age if you are overweight or have a high risk for diabetes. What should I know about preventing infection? Hepatitis B If you have a higher risk for hepatitis B, you should be screened for this virus. Talk with your health care provider to find out if you are at risk for hepatitis B infection. Hepatitis C Testing is recommended for:  Everyone born from [birth year] through 1963.  Anyone with known risk factors for hepatitis C. Sexually transmitted infections (STIs)  Get screened for STIs, including gonorrhea and chlamydia, if: ? You are sexually active and are younger than 57 years of age. ? You are older than 57 years of age and your health care provider tells you that you are at  risk for this type of infection. ? Your sexual activity has changed since you were last screened, and you are at increased risk for chlamydia or gonorrhea. Ask your health care provider if you are at risk.  Ask your health care provider about whether you are at high risk for HIV. Your health care provider may recommend a prescription medicine to help prevent HIV infection. If you choose to take medicine to prevent HIV, you should first get tested for HIV.  You should then be tested every 3 months for as long as you are taking the medicine. Pregnancy  If you are about to stop having your period (premenopausal) and you may become pregnant, seek counseling before you get pregnant.  Take 400 to 800 micrograms (mcg) of folic acid every day if you become pregnant.  Ask for birth control (contraception) if you want to prevent pregnancy. Osteoporosis and menopause Osteoporosis is a disease in which the bones lose minerals and strength with aging. This can result in bone fractures. If you are 65 years old or older, or if you are at risk for osteoporosis and fractures, ask your health care provider if you should:  Be screened for bone loss.  Take a calcium or vitamin D supplement to lower your risk of fractures.  Be given hormone replacement therapy (HRT) to treat symptoms of menopause. Follow these instructions at home: Lifestyle  Do not use any products that contain nicotine or tobacco, such as cigarettes, e-cigarettes, and chewing tobacco. If you need help quitting, ask your health care provider.  Do not use street drugs.  Do not share needles.  Ask your health care provider for help if you need support or information about quitting drugs. Alcohol use  Do not drink alcohol if: ? Your health care provider tells you not to drink. ? You are pregnant, may be pregnant, or are planning to become pregnant.  If you drink alcohol: ? Limit how much you use to 0-1 drink a day. ? Limit intake if you are breastfeeding.  Be aware of how much alcohol is in your drink. In the [place] one drink equals one 12 oz bottle of beer (355 mL), one 5 oz glass of wine (148 mL), or one 1 oz glass of hard liquor (44 mL). General instructions  Schedule regular health, dental, and eye exams.  Stay current with your vaccines.  Tell your health care provider if: ? You often feel depressed. ? You have ever been abused or do not feel safe at  home. Summary  Adopting a healthy lifestyle and getting preventive care are important in promoting health and wellness.  Follow your health care provider's instructions about healthy diet, exercising, and getting tested or screened for diseases.  Follow your health care provider's instructions on monitoring your cholesterol and blood pressure. This information is not intended to replace advice given to you by your health care provider. Make sure you discuss any questions you have with your health care provider. Document Revised: 09/30/2018 Document Reviewed: 09/30/2018 Elsevier Patient Education  2020 [employer].

## 2020-09-11 ENCOUNTER — Other Ambulatory Visit: Payer: Self-pay | Admitting: Internal Medicine

## 2020-09-11 ENCOUNTER — Encounter: Payer: Self-pay | Admitting: Internal Medicine

## 2020-09-11 ENCOUNTER — Ambulatory Visit (INDEPENDENT_AMBULATORY_CARE_PROVIDER_SITE_OTHER): Payer: 59 | Admitting: Internal Medicine

## 2020-09-11 ENCOUNTER — Other Ambulatory Visit: Payer: Self-pay

## 2020-09-11 VITALS — BP 122/78 | HR 66 | Temp 97.9°F | Ht 64.0 in | Wt 125.4 lb

## 2020-09-11 DIAGNOSIS — E782 Mixed hyperlipidemia: Secondary | ICD-10-CM | POA: Diagnosis not present

## 2020-09-11 DIAGNOSIS — G43719 Chronic migraine without aura, intractable, without status migrainosus: Secondary | ICD-10-CM

## 2020-09-11 DIAGNOSIS — K219 Gastro-esophageal reflux disease without esophagitis: Secondary | ICD-10-CM

## 2020-09-11 DIAGNOSIS — K5904 Chronic idiopathic constipation: Secondary | ICD-10-CM

## 2020-09-11 DIAGNOSIS — Z0000 Encounter for general adult medical examination without abnormal findings: Secondary | ICD-10-CM | POA: Diagnosis not present

## 2020-09-11 DIAGNOSIS — Z833 Family history of diabetes mellitus: Secondary | ICD-10-CM | POA: Diagnosis not present

## 2020-09-11 DIAGNOSIS — Z23 Encounter for immunization: Secondary | ICD-10-CM

## 2020-09-11 MED ORDER — DEXILANT 30 MG PO CPDR: 1.0000 | DELAYED_RELEASE_CAPSULE | Freq: Every day | ORAL | 3 refills | Status: DC

## 2020-09-11 MED ORDER — LINACLOTIDE 72 MCG PO CAPS: 72.0000 ug | ORAL_CAPSULE | Freq: Every day | ORAL | 1 refills | Status: DC

## 2020-09-11 MED ORDER — ELETRIPTAN HYDROBROMIDE 40 MG PO TABS: ORAL_TABLET | ORAL | 8 refills | Status: DC

## 2020-09-11 NOTE — Assessment & Plan Note (Signed)
Chronic Check lipid panel  Lifestyle controlled Regular exercise and healthy diet encouraged  

## 2020-09-11 NOTE — Assessment & Plan Note (Signed)
Chronic Intermittent Has had posterior headaches with vision changes recently - which is different than her usual migraines Responsive to relpax migraines improving - she will let me know if they do not continue to improve

## 2020-09-11 NOTE — Assessment & Plan Note (Signed)
Chronic Controlled, stable Continue linzess 72 mc daily

## 2020-09-11 NOTE — Assessment & Plan Note (Signed)
Chronic a1c 

## 2020-09-11 NOTE — Assessment & Plan Note (Signed)
Chronic GERD controlled Continue dexilant 30 mg daily

## 2020-09-12 ENCOUNTER — Encounter: Payer: Self-pay | Admitting: Internal Medicine

## 2020-09-12 LAB — LIPID PANEL
Cholesterol: 201 mg/dL — ABNORMAL HIGH (ref 0–200)
HDL: 79 mg/dL (ref >39.00)
LDL Cholesterol: 98 mg/dL (ref 0–99)
NonHDL: 121.53
Total CHOL/HDL Ratio: 3
Triglycerides: 116 mg/dL (ref 0.0–149.0)
VLDL: 23.2 mg/dL (ref 0.0–40.0)

## 2020-09-12 LAB — COMPREHENSIVE METABOLIC PANEL
ALT: 9 U/L (ref 0–35)
AST: 12 U/L (ref 0–37)
Albumin: 4.2 g/dL (ref 3.5–5.2)
Alkaline Phosphatase: 64 U/L (ref 39–117)
BUN: 12 mg/dL (ref 6–23)
CO2: 29 mEq/L (ref 19–32)
Calcium: 8.7 mg/dL (ref 8.4–10.5)
Chloride: 96 mEq/L (ref 96–112)
Creatinine, Ser: 0.78 mg/dL (ref 0.40–1.20)
GFR: 84.39 mL/min (ref >60.00)
Glucose, Bld: 88 mg/dL (ref 70–99)
Potassium: 4.3 mEq/L (ref 3.5–5.1)
Sodium: 131 mEq/L — ABNORMAL LOW (ref 135–145)
Total Bilirubin: 0.4 mg/dL (ref 0.2–1.2)
Total Protein: 6.7 g/dL (ref 6.0–8.3)

## 2020-09-12 LAB — CBC WITH DIFFERENTIAL/PLATELET
Basophils Absolute: 0.1 10*3/uL (ref 0.0–0.1)
Basophils Relative: 1 % (ref 0.0–3.0)
Eosinophils Absolute: 0 10*3/uL (ref 0.0–0.7)
Eosinophils Relative: 0.4 % (ref 0.0–5.0)
HCT: 40.8 % (ref 36.0–46.0)
Hemoglobin: 13.9 g/dL (ref 12.0–15.0)
Lymphocytes Relative: 33.8 % (ref 12.0–46.0)
Lymphs Abs: 2.1 10*3/uL (ref 0.7–4.0)
MCHC: 34.1 g/dL (ref 30.0–36.0)
MCV: 95.2 fl (ref 78.0–100.0)
Monocytes Absolute: 0.5 10*3/uL (ref 0.1–1.0)
Monocytes Relative: 8.6 % (ref 3.0–12.0)
Neutro Abs: 3.5 10*3/uL (ref 1.4–7.7)
Neutrophils Relative %: 56.2 % (ref 43.0–77.0)
Platelets: 283 10*3/uL (ref 150.0–400.0)
RBC: 4.29 Mil/uL (ref 3.87–5.11)
RDW: 12.7 % (ref 11.5–15.5)
WBC: 6.2 10*3/uL (ref 4.0–10.5)

## 2020-09-12 LAB — HEMOGLOBIN A1C: Hgb A1c MFr Bld: 5.1 % (ref 4.6–6.5)

## 2020-09-12 LAB — TSH: TSH: 1.93 u[IU]/mL (ref 0.35–4.50)

## 2020-09-12 NOTE — Addendum Note (Signed)
Addended by: Marcina Millard on: 09/12/2020 02:34 PM   Modules accepted: Orders

## 2020-09-20 MED FILL — ELETRIPTAN HYDROBROMIDE 40: 40 | 25 days supply | Qty: 10 | Fill #0

## 2020-09-20 MED FILL — BUTALB-ACETAMIN-CAFF 50-325: 50-325-40 | 18 days supply | Qty: 75 | Fill #5

## 2020-09-22 ENCOUNTER — Telehealth: Payer: 59 | Admitting: Family Medicine

## 2020-10-05 ENCOUNTER — Ambulatory Visit: Payer: 59 | Admitting: Family Medicine

## 2020-10-09 ENCOUNTER — Ambulatory Visit: Payer: 59 | Admitting: Family Medicine

## 2020-10-11 ENCOUNTER — Other Ambulatory Visit: Payer: Self-pay

## 2020-10-11 ENCOUNTER — Ambulatory Visit: Payer: 59 | Admitting: Family Medicine

## 2020-10-11 VITALS — BP 122/82 | Ht 64.0 in | Wt 125.0 lb

## 2020-10-11 DIAGNOSIS — M7552 Bursitis of left shoulder: Secondary | ICD-10-CM | POA: Diagnosis not present

## 2020-10-11 DIAGNOSIS — M71329 Other bursal cyst, unspecified elbow: Secondary | ICD-10-CM | POA: Diagnosis not present

## 2020-10-11 NOTE — Assessment & Plan Note (Signed)
Pain of this area with touch. No redness or swelling. Previous US did demonstrate a significant structure  -Counseled on cushioning and compression. -Could consider injection or imaging.

## 2020-10-11 NOTE — Assessment & Plan Note (Signed)
Did get relief with the steroid injection of the subacromial space but the pain has returned.  She notices the pain with certain activities when she is reaching out or if she is picking up anything with the left shoulder. -Counseled on home exercise therapy and supportive care. -Referral to physical therapy. -Could consider intra-articular injection versus referral to surgery. -Provided work note

## 2020-10-11 NOTE — Patient Instructions (Signed)
Good to see you Please try the padding on your elbow  Physical therapy will give you a call.   Please send me a message in MyChart with any questions or updates.  Please see me back in 6 weeks.   --Dr. Raeford Razor

## 2020-10-11 NOTE — Progress Notes (Signed)
Mindy [name] - 59 y.o. female MRN [id number]  Date of birth: 09/12/1961  SUBJECTIVE:  Including CC & ROS.  No chief complaint on file.   Mindy [name] is a 59 y.o. female that is following up for her left shoulder pain and presenting with ongoing left elbow pain.  She got about 4 days of relief with the steroid injection of the left shoulder but the pain has returned.  It seems like it was before the injection.  She also is demonstrating a left elbow pain where she feels like there is a small area of significant pain with any compression..    Review of Systems See HPI   HISTORY: Past Medical, Surgical, Social, and Family History Reviewed & Updated per EMR.   Pertinent Historical Findings include:  Past Medical History:  Diagnosis Date  . Allergic rhinitis   . Complication of anesthesia   . Elevated transaminase level 12.2014   ?etiology - present 09/2013 hosp for epigastric pain  . Family history of adverse reaction to anesthesia    father - PONV  . GERD (gastroesophageal reflux disease)   . History of bronchitis 10/2014  . Migraine   . PONV (postoperative nausea and vomiting)   . Seasonal allergies     Past Surgical History:  Procedure Laterality Date  . BREAST BIOPSY Right   . BREAST EXCISIONAL BIOPSY    . BREAST LUMPECTOMY WITH RADIOACTIVE SEED LOCALIZATION Right 10/08/2016   Procedure: RIGHT BREAST LUMPECTOMY WITH RADIOACTIVE SEED LOCALIZATION;  Surgeon: Coralie Keens, MD;  Location: [facility];  Service: General;  Laterality: Right;  . CHOLECYSTECTOMY N/A 09/21/2015   Procedure: LAPAROSCOPIC CHOLECYSTECTOMY;  Surgeon: Coralie Keens, MD;  Location: [facility];  Service: General;  Laterality: N/A;  . COLONOSCOPY  12/2013  . NECK SURGERY  2009  . PARTIAL HYSTERECTOMY  2007  . UPPER GI ENDOSCOPY  2016    Family History  Problem Relation Age of Onset  . Atrial fibrillation Father   . Diabetes type II Father   . Hypertension Father   . Coronary artery disease Father 13        stent x 2, AoVR  . Hypertension Mother   . Dementia Mother   . Hypertension Brother   . Breast cancer Maternal Aunt   . Breast cancer Maternal Grandmother   . Breast cancer Paternal Grandmother     Social History   Socioeconomic History  . Marital status: Divorced    Spouse name: Not on file  . Number of children: Not on file  . Years of education: Not on file  . Highest education level: Not on file  Occupational History  . Occupation: Therapist, sports  Tobacco Use  . Smoking status: Never Smoker  . Smokeless tobacco: Never Used  Vaping Use  . Vaping Use: Never used  Substance and Sexual Activity  . Alcohol use: Yes    Comment: occ  . Drug use: No  . Sexual activity: Not on file  Other Topics Concern  . Not on file  Social History Narrative   Exercise: on average 3/week - running, weights sometimes   Social Determinants of Health   Financial Resource Strain: Not on file  Food Insecurity: Not on file  Transportation Needs: Not on file  Physical Activity: Not on file  Stress: Not on file  Social Connections: Not on file  Intimate Partner Violence: Not on file     PHYSICAL EXAM:  VS: BP 122/82   Ht 5\' 4"  (1.626 m)  Wt 125 lb (56.7 kg)   BMI 21.46 kg/m  Physical Exam Gen: NAD, alert, cooperative with exam, well-appearing   ASSESSMENT & PLAN:   Subacromial bursitis of left shoulder joint Did get relief with the steroid injection of the subacromial space but the pain has returned.  She notices the pain with certain activities when she is reaching out or if she is picking up anything with the left shoulder. -Counseled on home exercise therapy and supportive care. -Referral to physical therapy. -Could consider intra-articular injection versus referral to surgery. -Provided work note  Bursal cyst of olecranon Pain of this area with touch. No redness or swelling. Previous US did demonstrate a significant structure  -Counseled on cushioning and compression. -Could  consider injection or imaging.

## 2020-10-18 ENCOUNTER — Other Ambulatory Visit: Payer: Self-pay | Admitting: Internal Medicine

## 2020-10-18 MED FILL — ELETRIPTAN HYDROBROMIDE 40: 40 | 25 days supply | Qty: 10 | Fill #1

## 2020-10-18 NOTE — Telephone Encounter (Signed)
Received a refill request for Fiorcet. Patient's last OV was on 08/03/20 with Dr. Maple Hudson. Next OV is scheduled for 08/09/21 with Dr. Maple Hudson. Last RX was written on 04/17/20 for 75 tablets with 5 refills.   Dr. Maple Hudson, please advise if you are ok with this refill. Thanks!

## 2020-10-18 NOTE — Telephone Encounter (Signed)
Fioricet refilled

## 2020-10-19 MED FILL — BUTALB-ACETAMIN-CAFF 50-325: 50-325-40 | 19 days supply | Qty: 75 | Fill #0

## 2020-10-30 ENCOUNTER — Other Ambulatory Visit: Payer: Self-pay

## 2020-10-30 ENCOUNTER — Ambulatory Visit: Payer: 59 | Attending: Family Medicine | Admitting: Physical Therapy

## 2020-10-30 DIAGNOSIS — M25512 Pain in left shoulder: Secondary | ICD-10-CM | POA: Insufficient documentation

## 2020-10-30 DIAGNOSIS — G8929 Other chronic pain: Secondary | ICD-10-CM | POA: Insufficient documentation

## 2020-10-30 DIAGNOSIS — M25612 Stiffness of left shoulder, not elsewhere classified: Secondary | ICD-10-CM | POA: Insufficient documentation

## 2020-10-30 DIAGNOSIS — M6281 Muscle weakness (generalized): Secondary | ICD-10-CM | POA: Insufficient documentation

## 2020-10-30 DIAGNOSIS — R29898 Other symptoms and signs involving the musculoskeletal system: Secondary | ICD-10-CM | POA: Insufficient documentation

## 2020-10-30 DIAGNOSIS — R293 Abnormal posture: Secondary | ICD-10-CM | POA: Insufficient documentation

## 2020-10-30 NOTE — Therapy (Signed)
[facility] [address]  [place], [place], [address] Phone: [phone]   Fax:  [phone]  Physical Therapy Evaluation  Patient Details  Name: Mindy [name] MRN: [id number] Date of Birth: 05-Apr-1961 Referring Provider (PT): Rosemarie Ax, MD   Encounter Date: 10/30/2020   PT End of Session - 10/30/20 1658    Visit Number 1    Number of Visits 12    Date for PT Re-Evaluation 12/11/20    Authorization Type [facility]    PT Start Time 3295    PT Stop Time 1800    PT Time Calculation (min) 62 min    Activity Tolerance Patient tolerated treatment well;Patient limited by pain    Behavior During Therapy [facility] for tasks assessed/performed           Past Medical History:  Diagnosis Date  . Allergic rhinitis   . Complication of anesthesia   . Elevated transaminase level 12.2014   ?etiology - present 09/2013 hosp for epigastric pain  . Family history of adverse reaction to anesthesia    father - PONV  . GERD (gastroesophageal reflux disease)   . History of bronchitis 10/2014  . Migraine   . PONV (postoperative nausea and vomiting)   . Seasonal allergies     Past Surgical History:  Procedure Laterality Date  . BREAST BIOPSY Right   . BREAST EXCISIONAL BIOPSY    . BREAST LUMPECTOMY WITH RADIOACTIVE SEED LOCALIZATION Right 10/08/2016   Procedure: RIGHT BREAST LUMPECTOMY WITH RADIOACTIVE SEED LOCALIZATION;  Surgeon: Coralie Keens, MD;  Location: [facility];  Service: General;  Laterality: Right;  . CHOLECYSTECTOMY N/A 09/21/2015   Procedure: LAPAROSCOPIC CHOLECYSTECTOMY;  Surgeon: Coralie Keens, MD;  Location: [facility];  Service: General;  Laterality: N/A;  . COLONOSCOPY  12/2013  . NECK SURGERY  2009  . PARTIAL HYSTERECTOMY  2007  . UPPER GI ENDOSCOPY  2016    There were no vitals filed for this visit.    Subjective Assessment - 10/30/20 1701    Subjective Pt reports a fall in August 2021 where she landed with  all her weight on her L arm. Initially had pain, numbness and tingling - N/T now essentially resolved and no pain at rest but still has pain with motions away from body with N/T is she pushes her shoulder too hard. Had injection in November w/o significant relief. MD prescribed HEP seems to aggravate pain.    Limitations House hold activities    Diagnostic tests 08/29/20 - L shoulder MRI:  Mild to moderate appearing supraspinatus and infraspinatus tendinopathy without tear. Small volume of subacromial/subdeltoid fluid compatible with  bursitis.    Patient Stated Goals "to be able to get back to working out the way I want to w/o pain & be able to shower w/o shoulder hurting"    Currently in Pain? No/denies    Pain Score 0-No pain   5-6/10 on average   Pain Location Shoulder    Pain Orientation Left;Anterior;Posterior;Lower    Pain Descriptors / Indicators Sharp;Radiating    Pain Type Acute pain    Pain Radiating Towards radiates up and down mostly in upper arm    Pain Onset More than a month ago   August 2021   Pain Frequency Intermittent    Aggravating Factors  any movements away from body or across body; lifting laptop    Pain Relieving Factors Aleve, ice, rest    Effect of Pain on Daily Activities  not working out like I used to [employer]; has to be careful with hand placement on steering wheel; limited ablity to reach/lift with L hand; bathing (washing R side of body); sleeping on L side              [facility] PT Assessment - 10/30/20 1658      Assessment   Medical Diagnosis L subacromial bursitis    Referring Provider (PT) Rosemarie Ax, MD    Onset Date/Surgical Date 06/02/20    Hand Dominance Left    Next MD Visit 12/22/20    Prior Therapy none for current condition; prior PT for hip pain      Precautions   Precautions None      Restrictions   Weight Bearing Restrictions No      Balance Screen   Has the patient fallen in the past 6 months Yes    How many times? 1     Has the patient had a decrease in activity level because of a fear of falling?  No    Is the patient reluctant to leave their home because of a fear of falling?  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      Cognition   Overall Cognitive Status Within Functional Limits for tasks assessed      Observation/Other Assessments   Focus on Therapeutic Outcomes (FOTO)  Shoulder - FS = 51; Predicted FS = 68      Posture/Postural Control   Posture/Postural Control Postural limitations    Postural Limitations Forward head;Rounded Shoulders    Posture Comments protracted scapula L>R      ROM / Strength   AROM / PROM / Strength AROM;PROM;Strength      AROM   AROM Assessment Site Shoulder    Right/Left Shoulder Right;Left    Right Shoulder Flexion 150 Degrees    Right Shoulder ABduction 145 Degrees    Right Shoulder Internal Rotation --   FIR to T9   Right Shoulder External Rotation --   FER to T3   Left Shoulder Flexion 130 Degrees   onset of pain ~112   Left Shoulder ABduction 116 Degrees    Left Shoulder Internal Rotation 60 Degrees   FIR to L3   Left Shoulder External Rotation 65 Degrees   FER to T3     Strength   Overall Strength Unable to assess;Due to pain      Palpation   Palpation comment very ttp with taut bands and TPs in L pecs, anterior deltoid, biceps, wrist flexor and extensor groups and posterior shoulder complex, esp teres group                      Objective measurements completed on examination: See above findings.       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;1 rep    Corner Stretch Limitations L low doorway pec/anterior deltoid stretch    Other Shoulder Stretches L brachial plexus nerve glide      Modalities   Modalities Electrical Stimulation;Moist Heat      Moist Heat Therapy   Number Minutes Moist Heat 10 Minutes    Moist Heat Location Shoulder   Lt     Electrical Stimulation   Electrical Stimulation Location L shoulder complex    Electrical Stimulation Action IFC    Electrical Stimulation Parameters 80-150 Hz, intensity to pt tol x 10'    Electrical Stimulation Goals Pain;Tone                  PT Education - 10/30/20 1800    Education Details PT eval findings, anticipated POC, initial HEP, info on home TENS unit    Person(s) Educated Patient    Methods Explanation;Demonstration;Verbal cues;Handout    Comprehension Verbalized understanding;Verbal cues required;Returned demonstration;Need further instruction            PT Short Term Goals - 10/30/20 1800      PT SHORT TERM GOAL #1   Title Patient will be independent with initial HEP    Target Date 11/13/20             PT Long Term Goals - 10/30/20 1800      PT LONG TERM GOAL #1   Title Patient will be independent with ongoing/advanced HEP for self-management at home    Status New    Target Date 12/11/20      PT LONG TERM GOAL #2   Title Improve posture and alignment with patient to demonstrate improved upright posture with posterior shoulder girdle engaged    Status New    Target Date 12/11/20      PT LONG TERM GOAL #3   Title Decrease pain in the L shoulder girdle by 50-75% allowing patient to use L UE for functional activities    Status New    Target Date 12/11/20      PT LONG TERM GOAL #4   Title Patient to  improve L shoulder AROM to WFL/WNL without pain provocation    Status New    Target Date 12/11/20      PT LONG TERM GOAL #5   Title Patient will demonstrate improved L shoulder strength to >/= 4 to 4+/5 for functional UE use    Status New    Target Date 12/11/20      PT LONG TERM GOAL #6   Title Patient to report ability to perform ADLs, household, and work-related tasks without limitation due to L shoulder pain, LOM or weakness    Status New    Target Date 12/11/20      PT LONG TERM GOAL #7   Title Patient to return to working out w/o limitation due to L shoulder pain    Status New    Target Date 12/11/20                  Plan - 10/30/20 1800    Clinical Impression Statement Doan is 60 y/o female who presents to OP PT for subacute/chronic L shoulder pain secondary to subacromial bursitis following a fall on 06/02/20 where she landed with most of her weight on her L arm. She reports she initially had constant pain, numbness and tingling t/o L UE (more  severe than when she was having cervical radiculopathy several years ago) but pain now more just with motions where she reaches away from or across her body with numbness and tingling only when she pushes her arm too much. Deficits include intermittent L shoulder pain, increased muscle tension and ttp t/o pecs, anterior deltoid and posterior shoulder complex especially teres group, forward head and rounded shoulders with L>R scapular protraction, painful and limited L shoulder A/PROM and decreased L shoulder strength. L shoulder pain limits functional use of L UE with reaching/lifting, bathing, driving, sleeping on her L side and prevents her from working out as she would normally. Mindy [name] will benefit from skilled PT to address above deficits, improve posture and restore pain-free ROM and strength to allow her to resume normal daily activities and workouts without pain interference.    Personal Factors and Comorbidities Time since onset of  injury/illness/exacerbation;Past/Current Experience;Profession;Comorbidity 3+    Comorbidities Cervical DDD with radiculitis s/p ACDF 2009, LBP with R sciatica, migraine, L lateral epicondylitis    Examination-Activity Limitations Bathing;Lift;Carry;Reach Overhead    Examination-Participation Restrictions Cleaning;Driving;Laundry;Occupation    Stability/Clinical Decision Making Stable/Uncomplicated    Clinical Decision Making Low    Rehab Potential Good    PT Frequency 2x / week    PT Duration 6 weeks    PT Treatment/Interventions ADLs/Self Care Home Management;Cryotherapy;Electrical Stimulation;Iontophoresis 4mg /ml Dexamethasone;Moist Heat;Ultrasound;Therapeutic activities;Therapeutic exercise;Neuromuscular re-education;Patient/family education;Manual techniques;Passive range of motion;Dry needling;Taping    PT Next Visit Plan Review brachial plexus nerve glide & pec/anterior deltoid stretch; postural stretching/strengthening, updating HEP as tolerated; manual therapy with possible DN to reduce muscle tension; modalities PRN    Recommended Other Services info provided on home TENS unit    Consulted and Agree with Plan of Care Patient           Patient will benefit from skilled therapeutic intervention in order to improve the following deficits and impairments:  Decreased activity tolerance,Decreased endurance,Decreased range of motion,Decreased strength,Increased fascial restricitons,Increased muscle spasms,Impaired perceived functional ability,Impaired flexibility,Impaired sensation,Impaired UE functional use,Improper body mechanics,Postural dysfunction,Pain  Visit Diagnosis: Chronic left shoulder pain - Plan: PT plan of care cert/re-cert  Stiffness of left shoulder, not elsewhere classified - Plan: PT plan of care cert/re-cert  Muscle weakness (generalized) - Plan: PT plan of care cert/re-cert  Abnormal posture - Plan: PT plan of care cert/re-cert  Other symptoms and signs involving  the musculoskeletal system - Plan: PT plan of care cert/re-cert     Problem List Patient Active Problem List   Diagnosis Date Noted  . Bursal cyst of olecranon 10/11/2020  . Family history of diabetes mellitus in father 09/10/2020  . Subacromial bursitis of left shoulder joint 06/13/2020  . Patellofemoral pain syndrome of left knee 03/23/2020  . Cervical disc disorder with radiculopathy of cervical region 10/12/2018  . Hyperlipidemia 09/07/2018  . Hip injury, right, initial encounter 07/03/2018  . Right hip pain 05/07/2018  . Sleep difficulties 06/06/2017  . Constipation 06/06/2017  . Insomnia 04/03/2017  . 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 Low back pain 02/13/2017  . Right sided sciatica 02/13/2017  . RLQ abdominal pain 08/20/2016  . Anal itching 08/20/2016  . GERD (gastroesophageal reflux disease) 06/05/2016  . Migraine 12/27/2015  . Headache 12/27/2015  . Lateral epicondylitis of left elbow 06/27/2015  . Cervical radiculitis 06/27/2015  . Non-allergic vasomotor rhinitis 06/01/2012    Percival Spanish, PT, MPT 10/30/2020, 7:19 PM  [facility] [address]  [place], [place], [address] Phone: [phone]   Fax:  [phone]  Name: Mindy [name] MRN: [id number] Date of Birth: 03-Jul-1961

## 2020-10-30 NOTE — Patient Instructions (Addendum)
Home exercise program created by Annie Paras, PT.  For questions, please contact Ellyn Rubiano via phone at [phone] or email at [email or web address]  [facility] [place], [place], [address] Phone: [phone]   Fax:  [phone]     Trigger Point Dry Needling  . What is Trigger Point Dry Needling (DN)? o DN is a physical therapy technique used to treat muscle pain and dysfunction. Specifically, DN helps deactivate muscle trigger points (muscle knots).  o A thin filiform needle is used to penetrate the skin and stimulate the underlying trigger point. The goal is for a local twitch response (LTR) to occur and for the trigger point to relax. No medication of any kind is injected during the procedure.   . What Does Trigger Point Dry Needling Feel Like?  o The procedure feels different for each individual patient. Some patients report that they do not actually feel the needle enter the skin and overall the process is not painful. Very mild bleeding may occur. However, many patients feel a deep cramping in the muscle in which the needle was inserted. This is the local twitch response.   Marland Kitchen How Will I feel after the treatment? o Soreness is normal, and the onset of soreness may not occur for a few hours. Typically this soreness does not last longer than two days.  o Bruising is uncommon, however; ice can be used to decrease any possible bruising.  o In rare cases feeling tired or nauseous after the treatment is normal. In addition, your symptoms may get worse before they get better, this period will typically not last longer than 24 hours.   . What Can I do After My Treatment? o Increase your hydration by drinking more water for the next 24 hours. o You may place ice or heat on the areas treated that have become sore, however, do not use heat on inflamed or bruised areas. Heat often brings more relief  post needling. o You can continue your regular activities, but vigorous activity is not recommended initially after the treatment for 24 hours. o DN is best combined with other physical therapy such as strengthening, stretching, and other therapies.    TENS UNIT  This is helpful for muscle pain and spasm.   Search and Purchase a TENS 7000 2nd edition at www.tenspros.com or www.amazon.com  (It should be less than $30)     TENS unit instructions:   Do not shower or bathe with the unit on  Turn the unit off before removing electrodes or batteries  If the electrodes lose stickiness add a drop of water to the electrodes after they are disconnected from the unit and place on plastic sheet. If you continued to have difficulty, call the TENS unit company to purchase more electrodes.  Do not apply lotion on the skin area prior to use. Make sure the skin is clean and dry as this will help prolong the life of the electrodes.  After use, always check skin for unusual red areas, rash or other skin difficulties. If there are any skin problems, does not apply electrodes to the same area.  Never remove the electrodes from the unit by pulling the wires.  Do not use the TENS unit or electrodes other than as directed.  Do not change electrode placement without consulting your therapist or physician.  Keep 2 fingers with between each electrode.   TENS stands for Transcutaneous Electrical Nerve  Stimulation. In other words, electrical impulses are allowed to pass through the skin in order to excite a nerve.   Purpose and Use of TENS:  TENS is a method used to manage acute and chronic pain without the use of drugs. It has been effective in managing pain associated with surgery, sprains, strains, trauma, rheumatoid arthritis, and neuralgias. It is a non-addictive, low risk, and non-invasive technique used to control pain. It is not, by any means, a curative form of treatment.   How TENS Works:  Most  TENS units are a Paramedic unit powered by one 9 volt battery. Attached to the outside of the unit are two lead wires where two pins and/or snaps connect on each wire. All units come with a set of four reusable pads or electrodes. These are placed on the skin surrounding the area involved. By inserting the leads into  the pads, the electricity can pass from the unit making the circuit complete.  As the intensity is turned up slowly, the electrical current enters the body from the electrodes through the skin to the surrounding nerve fibers. This triggers the release of hormones from within the body. These hormones contain pain relievers. By increasing the circulation of these hormones, the person's pain may be lessened. It is also believed that the electrical stimulation itself helps to block the pain messages being sent to the brain, thus also decreasing the body's perception of pain.   Hazards:  TENS units are NOT to be used by patients with PACEMAKERS, DEFIBRILLATORS, DIABETIC PUMPS, PREGNANT WOMEN, and patients with SEIZURE DISORDERS.  TENS units are NOT to be used over the heart, throat, brain, or spinal cord.  One of the major side effects from the TENS unit may be skin irritation. Some people may develop a rash if they are sensitive to the materials used in the electrodes or the connecting wires.   Wear the unit for up to 30-45 minutes, 3-4 x/day as needed for pain.   Avoid overuse due the body getting used to the stem making it not as effective over time.

## 2020-11-03 MED FILL — DEXILANT DR 30 MG CAPSULE: 30 | 90 days supply | Qty: 90 | Fill #0

## 2020-11-07 ENCOUNTER — Ambulatory Visit: Payer: 59 | Admitting: Physical Therapy

## 2020-11-07 ENCOUNTER — Encounter: Payer: Self-pay | Admitting: Physical Therapy

## 2020-11-07 ENCOUNTER — Other Ambulatory Visit: Payer: Self-pay

## 2020-11-07 DIAGNOSIS — R293 Abnormal posture: Secondary | ICD-10-CM

## 2020-11-07 DIAGNOSIS — M6281 Muscle weakness (generalized): Secondary | ICD-10-CM | POA: Diagnosis not present

## 2020-11-07 DIAGNOSIS — M25512 Pain in left shoulder: Secondary | ICD-10-CM

## 2020-11-07 DIAGNOSIS — G8929 Other chronic pain: Secondary | ICD-10-CM | POA: Diagnosis not present

## 2020-11-07 DIAGNOSIS — M25612 Stiffness of left shoulder, not elsewhere classified: Secondary | ICD-10-CM | POA: Diagnosis not present

## 2020-11-07 DIAGNOSIS — R29898 Other symptoms and signs involving the musculoskeletal system: Secondary | ICD-10-CM

## 2020-11-07 NOTE — Patient Instructions (Signed)
    Home exercise program created by Paisely Brick, PT.  For questions, please contact Lilleigh Hechavarria via phone at [phone] or email at [email or web address]  [facility] [address]  [address] [place], [place], [address] Phone: [phone]   Fax:  [phone]    

## 2020-11-07 NOTE — Therapy (Signed)
[facility] [address]  [place], [place], [address] Phone: [phone]   Fax:  [phone]  Physical Therapy Treatment  Patient Details  Name: Mindy [name] MRN: [id number] Date of Birth: Dec 27, 1960 Referring Provider (PT): Rosemarie Ax, MD   Encounter Date: 11/07/2020   PT End of Session - 11/07/20 1616    Visit Number 2    Number of Visits 12    Date for PT Re-Evaluation 12/11/20    Authorization Type [facility]    PT Start Time 1616    PT Stop Time 8242    PT Time Calculation (min) 59 min    Activity Tolerance Patient tolerated treatment well;Patient limited by pain    Behavior During Therapy [facility] for tasks assessed/performed           Past Medical History:  Diagnosis Date   Allergic rhinitis    Complication of anesthesia    Elevated transaminase level 12.2014   ?etiology - present 09/2013 hosp for epigastric pain   Family history of adverse reaction to anesthesia    father - PONV   GERD (gastroesophageal reflux disease)    History of bronchitis 10/2014   Migraine    PONV (postoperative nausea and vomiting)    Seasonal allergies     Past Surgical History:  Procedure Laterality Date   BREAST BIOPSY Right    BREAST EXCISIONAL BIOPSY     BREAST LUMPECTOMY WITH RADIOACTIVE SEED LOCALIZATION Right 10/08/2016   Procedure: RIGHT BREAST LUMPECTOMY WITH RADIOACTIVE SEED LOCALIZATION;  Surgeon: Coralie Keens, MD;  Location: [place];  Service: General;  Laterality: Right;   CHOLECYSTECTOMY N/A 09/21/2015   Procedure: LAPAROSCOPIC CHOLECYSTECTOMY;  Surgeon: Coralie Keens, MD;  Location: [place] [facility];  Service: General;  Laterality: N/A;   COLONOSCOPY  12/2013   NECK SURGERY  2009   PARTIAL HYSTERECTOMY  2007   UPPER GI ENDOSCOPY  2016    There were no vitals filed for this visit.   Subjective Assessment - 11/07/20 1619    Subjective Pt notes good relief from brachial plexus nerve glides and  completes them randomly as her shoulder feels irritable. She prefers B low doorway pec stretch over unilateral stretch.    Diagnostic tests 08/29/20 - L shoulder MRI:  Mild to moderate appearing supraspinatus and infraspinatus tendinopathy without tear. Small volume of subacromial/subdeltoid fluid compatible with  bursitis.    Patient Stated Goals "to be able to get back to working out the way I want to w/o pain & be able to shower w/o shoulder hurting"    Currently in Pain? No/denies    Pain Score 0-No pain   2-4/10 with movement   Pain Location Shoulder    Pain Orientation Left    Pain Descriptors / Indicators Sore;Sharp    Pain Type Acute pain    Pain Frequency Intermittent                             [facility] Adult PT Treatment/Exercise - 11/07/20 1616      Exercises   Exercises Shoulder      Shoulder Exercises: Seated   Flexion Left;10 reps;PROM;AAROM    Flexion Limitations table slides    Abduction Left;10 reps;PROM;AROM;AAROM    ABduction Limitations scaption table slides      Shoulder Exercises: Standing   Row Both;10 reps;Strengthening;Theraband    Theraband Level (Shoulder Row) Level 1 (Yellow)    Row Limitations  cues for scap retraction tucking elbows into ribs    Retraction Both;10 reps;AROM    Retraction Limitations attempted with yellow TB but deferred d/t increased pain - bettter toelrated w/o resistance      Shoulder Exercises: Pulleys   Flexion 3 minutes    Flexion Limitations pt noting increasing pain by end of attempt    Scaption Limitations attemtped but deferred d/t increased pain      Shoulder Exercises: Stretch   Corner Stretch 30 seconds;1 rep    Corner Stretch Limitations B low doorway pec/anterior deltoid stretch      Modalities   Modalities Electrical Stimulation;Moist Heat      Moist Heat Therapy   Number Minutes Moist Heat 15 Minutes    Moist Heat Location Shoulder   Lt     Electrical Stimulation   Electrical Stimulation  Location L shoulder complex    Electrical Stimulation Action IFC    Electrical Stimulation Parameters 80-150 Hz, intensity to pt tol x 10'    Electrical Stimulation Goals Pain;Tone      Manual Therapy   Manual Therapy Soft tissue mobilization;Myofascial release    Manual therapy comments skilled palpation and monitoring during DN    Soft tissue mobilization STM to L pecs anterolateral deltoid, proximal biceps and teres group - very ttp with + jump sign, but less ttp after DN    Myofascial Release manual TPR to L pecs anterolateral deltoid, proximal biceps and teres group            Trigger Point Dry Needling - 11/07/20 1616    Consent Given? Yes    Education Handout Provided Previously provided    Muscles Treated Upper Quadrant Pectoralis major;Deltoid   Lt   Pectoralis Major Response Twitch response elicited;Palpable increased muscle length    Deltoid Response --   anterior deltoid               PT Education - 11/07/20 1702    Education Details HEP update - scap retraction, yellow TB rows, flexion & scaption table slides    Person(s) Educated Patient    Methods Explanation;Demonstration;Verbal cues;Handout    Comprehension Verbalized understanding;Verbal cues required;Returned demonstration;Need further instruction            PT Short Term Goals - 11/07/20 1712      PT SHORT TERM GOAL #1   Title Patient will be independent with initial HEP    Status On-going    Target Date 11/13/20             PT Long Term Goals - 11/07/20 1712      PT LONG TERM GOAL #1   Title Patient will be independent with ongoing/advanced HEP for self-management at home    Status On-going    Target Date 12/11/20      PT LONG TERM GOAL #2   Title Improve posture and alignment with patient to demonstrate improved upright posture with posterior shoulder girdle engaged    Status On-going    Target Date 12/11/20      PT LONG TERM GOAL #3   Title Decrease pain in the L shoulder girdle  by 50-75% allowing patient to use L UE for functional activities    Status On-going    Target Date 12/11/20      PT LONG TERM GOAL #4   Title Patient to improve L shoulder AROM to WFL/WNL without pain provocation    Status On-going    Target Date 12/11/20  PT LONG TERM GOAL #5   Title Patient will demonstrate improved L shoulder strength to >/= 4 to 4+/5 for functional UE use    Status On-going    Target Date 12/11/20      PT LONG TERM GOAL #6   Title Patient to report ability to perform ADLs, household, and work-related tasks without limitation due to L shoulder pain, LOM or weakness    Status On-going    Target Date 12/11/20      PT LONG TERM GOAL #7   Title Patient to return to working out w/o limitation due to L shoulder pain    Status On-going    Target Date 12/11/20                 Plan - 11/07/20 1712    Clinical Impression Statement Mindy [name] reports good tolerance for initial HEP, noting good relief from brachial plexus nerve glide and preference for B as opposed to single arm low doorway pec stretch. She remains very ttp with multiple TPs and taut bands identified t/o L shoulder complex. After explanation of DN rational, procedures, outcomes and potential side effects, patient verbalized consent to DN treatment in conjunction with manual STM/DTM and TPR to reduce ttp/muscle tension. Muscles treated include L pecs and anterolateral deltoid, as well as manual TPR to teres group. DN & TPR produced normal response with good twitches elicited resulting in palpable reduction in pain/ttp and muscle tension. Pt educated to expect mild to moderate muscle soreness for up to 24-48 hrs and instructed to continue prescribed home exercise program, adding gentle scapular retraction strengthening as well as flexion and scaption table slides for gentle P/AAROM/stretching, and current activity level with pt verbalizing understanding of theses instructions. Session concluded with estim and  moist heat to L shoulder complex to promote further muscle relaxation/relief from muscle spasm as benefit noted from initial trial of estim on eval.    Comorbidities Cervical DDD with radiculitis s/p ACDF 2009, LBP with R sciatica, migraine, L lateral epicondylitis    Rehab Potential Good    PT Frequency 2x / week    PT Duration 6 weeks    PT Treatment/Interventions ADLs/Self Care Home Management;Cryotherapy;Electrical Stimulation;Iontophoresis 4mg /ml Dexamethasone;Moist Heat;Ultrasound;Therapeutic activities;Therapeutic exercise;Neuromuscular re-education;Patient/family education;Manual techniques;Passive range of motion;Dry needling;Taping    PT Next Visit Plan Assess response to DN; review brachial plexus nerve glide & updated HEP; postural stretching/strengthening, updating HEP as tolerated; manual therapy with possible DN to reduce muscle tension; modalities PRN    Consulted and Agree with Plan of Care Patient           Patient will benefit from skilled therapeutic intervention in order to improve the following deficits and impairments:  Decreased activity tolerance,Decreased endurance,Decreased range of motion,Decreased strength,Increased fascial restricitons,Increased muscle spasms,Impaired perceived functional ability,Impaired flexibility,Impaired sensation,Impaired UE functional use,Improper body mechanics,Postural dysfunction,Pain  Visit Diagnosis: Chronic left shoulder pain  Stiffness of left shoulder, not elsewhere classified  Muscle weakness (generalized)  Abnormal posture  Other symptoms and signs involving the musculoskeletal system     Problem List Patient Active Problem List   Diagnosis Date Noted   Bursal cyst of olecranon 10/11/2020   Family history of diabetes mellitus in father 09/10/2020   Subacromial bursitis of left shoulder joint 06/13/2020   Patellofemoral pain syndrome of left knee 03/23/2020   Cervical disc disorder with radiculopathy of cervical  region 10/12/2018   Hyperlipidemia 09/07/2018   Hip injury, right, initial encounter 07/03/2018   Right hip pain 05/07/2018  Sleep difficulties 06/06/2017   Constipation 06/06/2017   Insomnia 04/03/2017   Thrombophlebitis of superficial veins of right lower extremity 03/14/2017   Low back pain 02/13/2017   Right sided sciatica 02/13/2017   RLQ abdominal pain 08/20/2016   Anal itching 08/20/2016   GERD (gastroesophageal reflux disease) 06/05/2016   Migraine 12/27/2015   Headache 12/27/2015   Lateral epicondylitis of left elbow 06/27/2015   Cervical radiculitis 06/27/2015   Non-allergic vasomotor rhinitis 06/01/2012    Percival Spanish, PT, MPT 11/07/2020, 5:28 PM  [facility] [address]  [place], [place], [address] Phone: [phone]   Fax:  [phone]  Name: Mindy [name] MRN: [id number] Date of Birth: 1961-06-21

## 2020-11-08 MED FILL — FLUTICASONE PROP 50 MCG SPR: 50 | 30 days supply | Qty: 16 | Fill #1

## 2020-11-09 ENCOUNTER — Other Ambulatory Visit: Payer: Self-pay

## 2020-11-09 ENCOUNTER — Ambulatory Visit: Payer: 59 | Admitting: Physical Therapy

## 2020-11-09 ENCOUNTER — Encounter: Payer: Self-pay | Admitting: Physical Therapy

## 2020-11-09 DIAGNOSIS — R293 Abnormal posture: Secondary | ICD-10-CM

## 2020-11-09 DIAGNOSIS — M25512 Pain in left shoulder: Secondary | ICD-10-CM | POA: Diagnosis not present

## 2020-11-09 DIAGNOSIS — R29898 Other symptoms and signs involving the musculoskeletal system: Secondary | ICD-10-CM

## 2020-11-09 DIAGNOSIS — G8929 Other chronic pain: Secondary | ICD-10-CM

## 2020-11-09 DIAGNOSIS — M6281 Muscle weakness (generalized): Secondary | ICD-10-CM

## 2020-11-09 DIAGNOSIS — M25612 Stiffness of left shoulder, not elsewhere classified: Secondary | ICD-10-CM | POA: Diagnosis not present

## 2020-11-09 NOTE — Therapy (Signed)
[facility] [address]  [address] [place], [place], [address] Phone: [phone]   Fax:  [phone]  Physical Therapy Treatment  Patient Details  Name: Mindy [name] MRN: [id number] Date of Birth: 11/03/1960 Referring Provider (PT): Myra Rude, MD   Encounter Date: 11/09/2020   PT End of Session - 11/09/20 1616    Visit Number 3    Number of Visits 12    Date for PT Re-Evaluation 12/11/20    Authorization Type [facility]    PT Start Time 1616    PT Stop Time 1655    PT Time Calculation (min) 39 min    Activity Tolerance Patient tolerated treatment well;Patient limited by pain    Behavior During Therapy [facility] for tasks assessed/performed           Past Medical History:  Diagnosis Date  . Allergic rhinitis   . Complication of anesthesia   . Elevated transaminase level 12.2014   ?etiology - present 09/2013 hosp for epigastric pain  . Family history of adverse reaction to anesthesia    father - PONV  . GERD (gastroesophageal reflux disease)   . History of bronchitis 10/2014  . Migraine   . PONV (postoperative nausea and vomiting)   . Seasonal allergies     Past Surgical History:  Procedure Laterality Date  . BREAST BIOPSY Right   . BREAST EXCISIONAL BIOPSY    . BREAST LUMPECTOMY WITH RADIOACTIVE SEED LOCALIZATION Right 10/08/2016   Procedure: RIGHT BREAST LUMPECTOMY WITH RADIOACTIVE SEED LOCALIZATION;  Surgeon: Abigail Miyamoto, MD;  Location: [facility];  Service: General;  Laterality: Right;  . CHOLECYSTECTOMY N/A 09/21/2015   Procedure: LAPAROSCOPIC CHOLECYSTECTOMY;  Surgeon: Abigail Miyamoto, MD;  Location: [facility];  Service: General;  Laterality: N/A;  . COLONOSCOPY  12/2013  . NECK SURGERY  2009  . PARTIAL HYSTERECTOMY  2007  . UPPER GI ENDOSCOPY  2016    There were no vitals filed for this visit.   Subjective Assessment - 11/09/20 1622    Subjective Pt thinks DN last visit may have helped some.     Diagnostic tests 08/29/20 - L shoulder MRI:  Mild to moderate appearing supraspinatus and infraspinatus tendinopathy without tear. Small volume of subacromial/subdeltoid fluid compatible with  bursitis.    Patient Stated Goals "to be able to get back to working out the way I want to w/o pain & be able to shower w/o shoulder hurting"    Currently in Pain? No/denies    Pain Score 0-No pain   2-4/10 with movement   Pain Location Shoulder    Pain Orientation Left    Pain Descriptors / Indicators Sore;Sharp                             [facility] Adult PT Treatment/Exercise - 11/09/20 1616      Exercises   Exercises Shoulder      Shoulder Exercises: Standing   Flexion Left;10 reps;AAROM    Flexion Limitations wall slides to point of gentle stretch, avoiding painful ROM    Row Both;15 reps;Strengthening;Theraband    Theraband Level (Shoulder Row) Level 1 (Yellow)    Row Limitations good scap retraction tucking elbows into ribs    Retraction Both;10 reps;AROM    Retraction Limitations yellow TB still deferred d/t increased pain, but good  tolerance w/o resistance      Shoulder Exercises: Therapy Ball   Flexion Both;10 reps  Flexion Limitations seated green Pball roll-outs      Shoulder Exercises: ROM/Strengthening   UBE (Upper Arm Bike) L1.0 x 4 min (2' fwd/2' back)      Shoulder Exercises: Isometric Strengthening   Flexion Limitations 10 x 5", 30-50% effort    Extension Limitations 10 x 5", 30-50% effort    External Rotation Limitations 10 x 5", 30-50% effort    Internal Rotation Limitations 10 x 5", 30-50% effort    ABduction Limitations 10 x 5", 30-50% effort      Shoulder Exercises: Stretch   Other Shoulder Stretches L brachial plexus nerve glide                  PT Education - 11/09/20 1655    Education Details HEP update - shoulder isometrics    Person(s) Educated Patient    Methods Explanation;Demonstration;Verbal cues;Handout    Comprehension  Verbalized understanding;Verbal cues required;Returned demonstration;Need further instruction            PT Short Term Goals - 11/07/20 1712      PT SHORT TERM GOAL #1   Title Patient will be independent with initial HEP    Status On-going    Target Date 11/13/20             PT Long Term Goals - 11/07/20 1712      PT LONG TERM GOAL #1   Title Patient will be independent with ongoing/advanced HEP for self-management at home    Status On-going    Target Date 12/11/20      PT LONG TERM GOAL #2   Title Improve posture and alignment with patient to demonstrate improved upright posture with posterior shoulder girdle engaged    Status On-going    Target Date 12/11/20      PT LONG TERM GOAL #3   Title Decrease pain in the L shoulder girdle by 50-75% allowing patient to use L UE for functional activities    Status On-going    Target Date 12/11/20      PT LONG TERM GOAL #4   Title Patient to improve L shoulder AROM to WFL/WNL without pain provocation    Status On-going    Target Date 12/11/20      PT LONG TERM GOAL #5   Title Patient will demonstrate improved L shoulder strength to >/= 4 to 4+/5 for functional UE use    Status On-going    Target Date 12/11/20      PT LONG TERM GOAL #6   Title Patient to report ability to perform ADLs, household, and work-related tasks without limitation due to L shoulder pain, LOM or weakness    Status On-going    Target Date 12/11/20      PT LONG TERM GOAL #7   Title Patient to return to working out w/o limitation due to L shoulder pain    Status On-going    Target Date 12/11/20                 Plan - 11/09/20 1621    Clinical Impression Statement Mindy [name] remains very ttp t/o L shoulder complex with questionable benefit noted from DN and very limited tolerance for manual STM/TPR therefore reviewed self-STM options with tennis/small ball and/or Theracane to allow for personal control of pressure - pt noting more comfort with  larger small ball (~softball size). She was able to perform good return demonstration of initial HEP (STG met) but continues to have limited tolerance for AROM/strengthening but  was able to complete isometric strengthening with significant increased pain, therefore HEP updated to include isometrics.    Comorbidities Cervical DDD with radiculitis s/p ACDF 2009, LBP with R sciatica, migraine, L lateral epicondylitis    Rehab Potential Good    PT Frequency 2x / week    PT Duration 6 weeks    PT Treatment/Interventions ADLs/Self Care Home Management;Cryotherapy;Electrical Stimulation;Iontophoresis 4mg /ml Dexamethasone;Moist Heat;Ultrasound;Therapeutic activities;Therapeutic exercise;Neuromuscular re-education;Patient/family education;Manual techniques;Passive range of motion;Dry needling;Taping    PT Next Visit Plan postural stretching/strengthening, updating HEP as tolerated; manual therapy with possible DN to reduce muscle tension; modalities PRN    Consulted and Agree with Plan of Care Patient           Patient will benefit from skilled therapeutic intervention in order to improve the following deficits and impairments:  Decreased activity tolerance,Decreased endurance,Decreased range of motion,Decreased strength,Increased fascial restricitons,Increased muscle spasms,Impaired perceived functional ability,Impaired flexibility,Impaired sensation,Impaired UE functional use,Improper body mechanics,Postural dysfunction,Pain  Visit Diagnosis: Chronic left shoulder pain  Stiffness of left shoulder, not elsewhere classified  Muscle weakness (generalized)  Abnormal posture  Other symptoms and signs involving the musculoskeletal system     Problem List Patient Active Problem List   Diagnosis Date Noted  . Bursal cyst of olecranon 10/11/2020  . Family history of diabetes mellitus in father 09/10/2020  . Subacromial bursitis of left shoulder joint 06/13/2020  . Patellofemoral pain syndrome of  left knee 03/23/2020  . Cervical disc disorder with radiculopathy of cervical region 10/12/2018  . Hyperlipidemia 09/07/2018  . Hip injury, right, initial encounter 07/03/2018  . Right hip pain 05/07/2018  . Sleep difficulties 06/06/2017  . Constipation 06/06/2017  . Insomnia 04/03/2017  . 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 Low back pain 02/13/2017  . Right sided sciatica 02/13/2017  . RLQ abdominal pain 08/20/2016  . Anal itching 08/20/2016  . GERD (gastroesophageal reflux disease) 06/05/2016  . Migraine 12/27/2015  . Headache 12/27/2015  . Lateral epicondylitis of left elbow 06/27/2015  . Cervical radiculitis 06/27/2015  . Non-allergic vasomotor rhinitis 06/01/2012    Percival Spanish, PT, MPT 11/09/2020, 7:05 PM  [facility] [address]  [place], [place], [address] Phone: [phone]   Fax:  [phone]  Name: Mindy [name] MRN: [id number] Date of Birth: [birth year]

## 2020-11-09 NOTE — Patient Instructions (Signed)
    Home exercise program created by Tameko Halder, PT.  For questions, please contact Cleopha Indelicato via phone at [phone] or email at [email or web address]  [facility] [address]  [address] [place], [place], [address] Phone: [phone]   Fax:  [phone]    

## 2020-11-14 ENCOUNTER — Ambulatory Visit: Payer: 59

## 2020-11-14 ENCOUNTER — Other Ambulatory Visit: Payer: Self-pay

## 2020-11-14 DIAGNOSIS — M25612 Stiffness of left shoulder, not elsewhere classified: Secondary | ICD-10-CM

## 2020-11-14 DIAGNOSIS — R293 Abnormal posture: Secondary | ICD-10-CM | POA: Diagnosis not present

## 2020-11-14 DIAGNOSIS — G8929 Other chronic pain: Secondary | ICD-10-CM | POA: Diagnosis not present

## 2020-11-14 DIAGNOSIS — M25512 Pain in left shoulder: Secondary | ICD-10-CM

## 2020-11-14 DIAGNOSIS — M6281 Muscle weakness (generalized): Secondary | ICD-10-CM

## 2020-11-14 DIAGNOSIS — R29898 Other symptoms and signs involving the musculoskeletal system: Secondary | ICD-10-CM | POA: Diagnosis not present

## 2020-11-14 NOTE — Therapy (Signed)
[facility] [address]  [place], [place], [address] Phone: [phone]   Fax:  [phone]  Physical Therapy Treatment  Patient Details  Name: Mindy [name] MRN: [id number] Date of Birth: 1961/09/09 Referring Provider (PT): Rosemarie Ax, MD   Encounter Date: 11/14/2020   PT End of Session - 11/14/20 1627    Visit Number 4    Number of Visits 12    Date for PT Re-Evaluation 12/11/20    Authorization Type [facility]    PT Start Time 1618    PT Stop Time 1703    PT Time Calculation (min) 45 min    Activity Tolerance Patient tolerated treatment well    Behavior During Therapy [facility] for tasks assessed/performed           Past Medical History:  Diagnosis Date  . Allergic rhinitis   . Complication of anesthesia   . Elevated transaminase level 12.2014   ?etiology - present 09/2013 hosp for epigastric pain  . Family history of adverse reaction to anesthesia    father - PONV  . GERD (gastroesophageal reflux disease)   . History of bronchitis 10/2014  . Migraine   . PONV (postoperative nausea and vomiting)   . Seasonal allergies     Past Surgical History:  Procedure Laterality Date  . BREAST BIOPSY Right   . BREAST EXCISIONAL BIOPSY    . BREAST LUMPECTOMY WITH RADIOACTIVE SEED LOCALIZATION Right 10/08/2016   Procedure: RIGHT BREAST LUMPECTOMY WITH RADIOACTIVE SEED LOCALIZATION;  Surgeon: Coralie Keens, MD;  Location: [place];  Service: General;  Laterality: Right;  . CHOLECYSTECTOMY N/A 09/21/2015   Procedure: LAPAROSCOPIC CHOLECYSTECTOMY;  Surgeon: Coralie Keens, MD;  Location: [place];  Service: General;  Laterality: N/A;  . COLONOSCOPY  12/2013  . NECK SURGERY  2009  . PARTIAL HYSTERECTOMY  2007  . UPPER GI ENDOSCOPY  2016    There were no vitals filed for this visit.   Subjective Assessment - 11/14/20 1624    Subjective Pt. doing ok.    Diagnostic tests 08/29/20 - L shoulder MRI:  Mild to moderate  appearing supraspinatus and infraspinatus tendinopathy without tear. Small volume of subacromial/subdeltoid fluid compatible with  bursitis.    Patient Stated Goals "to be able to get back to working out the way I want to w/o pain & be able to shower w/o shoulder hurting"    Currently in Pain? No/denies    Pain Score 0-No pain   Tightness up to a 4-5/10   Pain Location Shoulder    Pain Orientation Left    Pain Descriptors / Indicators Tightness    Pain Type Acute pain    Aggravating Factors  any movements away from the body, across body, lifting backpack    Multiple Pain Sites No                             [facility] Adult PT Treatment/Exercise - 11/14/20 0001      Shoulder Exercises: Supine   Other Supine Exercises B horizontal abduction with yellow TB (pulling across beltline) x 15 reps    Other Supine Exercises B scapular retraction hooklying on 6" bolster x 10      Shoulder Exercises: Sidelying   External Rotation Left;10 reps;AROM    External Rotation Limitations towel under elbow   just past neutral   ABduction Left;10 reps;AROM    ABduction Limitations slight scaption  Shoulder Exercises: Standing   Other Standing Exercises Instruction in L shoulder pendulums CW, CCW x 30 sec      Shoulder Exercises: ROM/Strengthening   UBE (Upper Arm Bike) L1.0 x 4 min (2' fwd/2' back)      Shoulder Exercises: Stretch   Corner Stretch 30 seconds;1 rep    Corner Stretch Limitations B low doorway pec/anterior deltoid stretch    Other Shoulder Stretches L brachial plexus nerve glide    Other Shoulder Stretches L mid doorway stretch 2 x 20 sec   cues for gentle                   PT Short Term Goals - 11/14/20 1813      PT SHORT TERM GOAL #1   Title Patient will be independent with initial HEP    Status Achieved    Target Date 11/13/20             PT Long Term Goals - 11/07/20 1712      PT LONG TERM GOAL #1   Title Patient will be independent with  ongoing/advanced HEP for self-management at home    Status On-going    Target Date 12/11/20      PT LONG TERM GOAL #2   Title Improve posture and alignment with patient to demonstrate improved upright posture with posterior shoulder girdle engaged    Status On-going    Target Date 12/11/20      PT LONG TERM GOAL #3   Title Decrease pain in the L shoulder girdle by 50-75% allowing patient to use L UE for functional activities    Status On-going    Target Date 12/11/20      PT LONG TERM GOAL #4   Title Patient to improve L shoulder AROM to WFL/WNL without pain provocation    Status On-going    Target Date 12/11/20      PT LONG TERM GOAL #5   Title Patient will demonstrate improved L shoulder strength to >/= 4 to 4+/5 for functional UE use    Status On-going    Target Date 12/11/20      PT LONG TERM GOAL #6   Title Patient to report ability to perform ADLs, household, and work-related tasks without limitation due to L shoulder pain, LOM or weakness    Status On-going    Target Date 12/11/20      PT LONG TERM GOAL #7   Title Patient to return to working out w/o limitation due to L shoulder pain    Status On-going    Target Date 12/11/20                 Plan - 11/14/20 1652    Clinical Impression Statement Mindy [name] reports she is performing HEP.  With good recall of proper technique overall with review.  Was able to progress with shoulder isometrics however only tolerated limited ROM with sidelying AROM abduction, ER due to pain at end ranges of motion.  Attempted to avoid painful activities in session today and pt. instructed to continue avoiding painful activities at home as able.     Comorbidities Cervical DDD with radiculitis s/p ACDF 2009, LBP with R sciatica, migraine, L lateral epicondylitis    Rehab Potential Good    PT Frequency 2x / week    PT Duration 6 weeks    PT Treatment/Interventions ADLs/Self Care Home Management;Cryotherapy;Electrical  Stimulation;Iontophoresis 4mg /ml Dexamethasone;Moist Heat;Ultrasound;Therapeutic activities;Therapeutic exercise;Neuromuscular re-education;Patient/family education;Manual techniques;Passive range of motion;Dry needling;Taping  PT Next Visit Plan postural stretching/strengthening, updating HEP as tolerated; manual therapy with possible DN to reduce muscle tension; modalities PRN    Consulted and Agree with Plan of Care Patient           Patient will benefit from skilled therapeutic intervention in order to improve the following deficits and impairments:  Decreased activity tolerance,Decreased endurance,Decreased range of motion,Decreased strength,Increased fascial restricitons,Increased muscle spasms,Impaired perceived functional ability,Impaired flexibility,Impaired sensation,Impaired UE functional use,Improper body mechanics,Postural dysfunction,Pain  Visit Diagnosis: Chronic left shoulder pain  Stiffness of left shoulder, not elsewhere classified  Muscle weakness (generalized)  Abnormal posture  Other symptoms and signs involving the musculoskeletal system     Problem List Patient Active Problem List   Diagnosis Date Noted  . Bursal cyst of olecranon 10/11/2020  . Family history of diabetes mellitus in father 09/10/2020  . Subacromial bursitis of left shoulder joint 06/13/2020  . Patellofemoral pain syndrome of left knee 03/23/2020  . Cervical disc disorder with radiculopathy of cervical region 10/12/2018  . Hyperlipidemia 09/07/2018  . Hip injury, right, initial encounter 07/03/2018  . Right hip pain 05/07/2018  . Sleep difficulties 06/06/2017  . Constipation 06/06/2017  . Insomnia 04/03/2017  . 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 Low back pain 02/13/2017  . Right sided sciatica 02/13/2017  . RLQ abdominal pain 08/20/2016  . Anal itching 08/20/2016  . GERD (gastroesophageal reflux disease) 06/05/2016  . Migraine 12/27/2015  .  Headache 12/27/2015  . Lateral epicondylitis of left elbow 06/27/2015  . Cervical radiculitis 06/27/2015  . Non-allergic vasomotor rhinitis 06/01/2012    Bess Harvest, PTA 11/14/20 6:14 PM   [facility] [address]  [place], [place], [address] Phone: [phone]   Fax:  [phone]  Name: Mindy [name] MRN: [id number] Date of Birth: 1961/03/18

## 2020-11-16 ENCOUNTER — Other Ambulatory Visit: Payer: Self-pay

## 2020-11-16 ENCOUNTER — Ambulatory Visit: Payer: 59 | Admitting: Physical Therapy

## 2020-11-16 ENCOUNTER — Encounter: Payer: Self-pay | Admitting: Physical Therapy

## 2020-11-16 DIAGNOSIS — R29898 Other symptoms and signs involving the musculoskeletal system: Secondary | ICD-10-CM | POA: Diagnosis not present

## 2020-11-16 DIAGNOSIS — M25612 Stiffness of left shoulder, not elsewhere classified: Secondary | ICD-10-CM

## 2020-11-16 DIAGNOSIS — R293 Abnormal posture: Secondary | ICD-10-CM

## 2020-11-16 DIAGNOSIS — M25512 Pain in left shoulder: Secondary | ICD-10-CM | POA: Diagnosis not present

## 2020-11-16 DIAGNOSIS — G8929 Other chronic pain: Secondary | ICD-10-CM | POA: Diagnosis not present

## 2020-11-16 DIAGNOSIS — M6281 Muscle weakness (generalized): Secondary | ICD-10-CM

## 2020-11-16 NOTE — Therapy (Signed)
[facility] [address]  [place], [place], [address] Phone: [phone]   Fax:  [phone]  Physical Therapy Treatment  Patient Details  Name: Mindy [name] MRN: [id number] Date of Birth: 03/11/1961 Referring Provider (PT): Rosemarie Ax, MD   Encounter Date: 11/16/2020   PT End of Session - 11/16/20 1617    Visit Number 5    Number of Visits 12    Date for PT Re-Evaluation 12/11/20    Authorization Type [facility]    PT Start Time 7824    PT Stop Time 1657    PT Time Calculation (min) 40 min    Activity Tolerance Patient tolerated treatment well    Behavior During Therapy [facility] for tasks assessed/performed           Past Medical History:  Diagnosis Date  . Allergic rhinitis   . Complication of anesthesia   . Elevated transaminase level 12.2014   ?etiology - present 09/2013 hosp for epigastric pain  . Family history of adverse reaction to anesthesia    father - PONV  . GERD (gastroesophageal reflux disease)   . History of bronchitis 10/2014  . Migraine   . PONV (postoperative nausea and vomiting)   . Seasonal allergies     Past Surgical History:  Procedure Laterality Date  . BREAST BIOPSY Right   . BREAST EXCISIONAL BIOPSY    . BREAST LUMPECTOMY WITH RADIOACTIVE SEED LOCALIZATION Right 10/08/2016   Procedure: RIGHT BREAST LUMPECTOMY WITH RADIOACTIVE SEED LOCALIZATION;  Surgeon: Coralie Keens, MD;  Location: [place];  Service: General;  Laterality: Right;  . CHOLECYSTECTOMY N/A 09/21/2015   Procedure: LAPAROSCOPIC CHOLECYSTECTOMY;  Surgeon: Coralie Keens, MD;  Location: [facility];  Service: General;  Laterality: N/A;  . COLONOSCOPY  12/2013  . NECK SURGERY  2009  . PARTIAL HYSTERECTOMY  2007  . UPPER GI ENDOSCOPY  2016    There were no vitals filed for this visit.   Subjective Assessment - 11/16/20 1619    Subjective Pt states her shoulder seemd to be doing better with pain typically no more than  2/10.    Diagnostic tests 08/29/20 - L shoulder MRI:  Mild to moderate appearing supraspinatus and infraspinatus tendinopathy without tear. Small volume of subacromial/subdeltoid fluid compatible with  bursitis.    Patient Stated Goals "to be able to get back to working out the way I want to w/o pain & be able to shower w/o shoulder hurting"    Currently in Pain? No/denies    Pain Score 0-No pain   up to 2/10 with some motion into fwd flexion/scaption                            [facility] Adult PT Treatment/Exercise - 11/16/20 1617      Exercises   Exercises Shoulder      Shoulder Exercises: Supine   Horizontal ABduction Both;10 reps;Strengthening;Theraband   2 sets   Theraband Level (Shoulder Horizontal ABduction) Level 1 (Yellow)    Horizontal ABduction Limitations 1st set - band across hips, 2nd set - band across lower ribs   hooklying on 6" FR   External Rotation Both;5 reps;AROM    External Rotation Limitations attempted hooklying on 6" FR, but deferred d/t increased pain on L    Diagonals Both;10 reps;AROM    Diagonals Limitations hooklying on 6" FR - no resistance      Shoulder Exercises: Sidelying  Other Sidelying Exercises L open book stretch 10 x 5"      Shoulder Exercises: Standing   External Rotation Left;10 reps;Strengthening;Theraband    Theraband Level (Shoulder External Rotation) Level 1 (Yellow)    External Rotation Limitations isometric step-out (2 steps)    Internal Rotation Left;10 reps;Strengthening;Theraband    Theraband Level (Shoulder Internal Rotation) Level 1 (Yellow)    Internal Rotation Limitations isometric step-out (1 step)    Row Both;15 reps;Strengthening;Theraband    Theraband Level (Shoulder Row) Level 2 (Red)    Row Limitations cues for scap retraction tucking elbows into ribs, avoiding excessive shoulder extension or upward rotation of elbows    Retraction Both;10 reps;Strengthening;Theraband    Theraband Level (Shoulder Retraction)  Level 1 (Yellow)    Retraction Limitations emphasis on scap retraction & depression with shoulder extension to neutral      Shoulder Exercises: ROM/Strengthening   UBE (Upper Arm Bike) L1.5 x 6 min (3' fwd/3' back)      Manual Therapy   Manual Therapy Soft tissue mobilization;Myofascial release    Soft tissue mobilization STM to L pecs, anterolateral deltoid & proximal biceps - decreased ttp    Myofascial Release manual TPR to L pecs & anterolateral deltoid - better tolerance      Neck Exercises: Stretches   Chest Stretch 60 seconds;1 rep    Chest Stretch Limitations hooklying on 6" FR                  PT Education - 11/16/20 1657    Education Details HEP update - isometric shoulder IR/ER progressed to yellow TB step-outs, yellow TB added to scap retraction/low row, mid row progressed to red TB    Person(s) Educated Patient    Methods Explanation;Demonstration;Verbal cues    Comprehension Verbalized understanding;Verbal cues required;Returned demonstration            PT Short Term Goals - 11/16/20 1621      PT SHORT TERM GOAL #1   Title Patient will be independent with initial HEP    Status Achieved   11/14/20            PT Long Term Goals - 11/16/20 1655      PT LONG TERM GOAL #1   Title Patient will be independent with ongoing/advanced HEP for self-management at home    Status Partially Met    Target Date 12/11/20      PT LONG TERM GOAL #2   Title Improve posture and alignment with patient to demonstrate improved upright posture with posterior shoulder girdle engaged    Status Partially Met    Target Date 12/11/20      PT LONG TERM GOAL #3   Title Decrease pain in the L shoulder girdle by 50-75% allowing patient to use L UE for functional activities    Status Partially Met    Target Date 12/11/20      PT LONG TERM GOAL #4   Title Patient to improve L shoulder AROM to WFL/WNL without pain provocation    Status On-going    Target Date 12/11/20       PT LONG TERM GOAL #5   Title Patient will demonstrate improved L shoulder strength to >/= 4 to 4+/5 for functional UE use    Status On-going    Target Date 12/11/20      PT LONG TERM GOAL #6   Title Patient to report ability to perform ADLs, household, and work-related tasks without limitation due to L  shoulder pain, LOM or weakness    Status On-going    Target Date 12/11/20      PT LONG TERM GOAL #7   Title Patient to return to working out w/o limitation due to L shoulder pain    Status On-going    Target Date 12/11/20                 Plan - 11/16/20 1622    Clinical Impression Statement Rooney reports 50% reduction in pain since start of PT with intensity of pain when triggered also decreasing. She continues to tolerate progression of stretching and strengthening exercises including transition to yellow TB isometric IR/ER step-outs and multi-angle scapular retraction with decreasing pain interference. Some ttp with taut bands/TPs persisting in L anterior shoulder, although pt reports sensitivity/tenderness is much less severe. Honey is demonstrating good progress toward goals with some LTGs now partially met.    Comorbidities Cervical DDD with radiculitis s/p ACDF 2009, LBP with R sciatica, migraine, L lateral epicondylitis    Rehab Potential Good    PT Frequency 2x / week    PT Duration 6 weeks    PT Treatment/Interventions ADLs/Self Care Home Management;Cryotherapy;Electrical Stimulation;Iontophoresis 4mg /ml Dexamethasone;Moist Heat;Ultrasound;Therapeutic activities;Therapeutic exercise;Neuromuscular re-education;Patient/family education;Manual techniques;Passive range of motion;Dry needling;Taping    PT Next Visit Plan postural stretching/strengthening, updating HEP as tolerated; manual therapy with possible DN to reduce muscle tension; modalities PRN    Consulted and Agree with Plan of Care Patient           Patient will benefit from skilled therapeutic intervention in  order to improve the following deficits and impairments:  Decreased activity tolerance,Decreased endurance,Decreased range of motion,Decreased strength,Increased fascial restricitons,Increased muscle spasms,Impaired perceived functional ability,Impaired flexibility,Impaired sensation,Impaired UE functional use,Improper body mechanics,Postural dysfunction,Pain  Visit Diagnosis: Chronic left shoulder pain  Stiffness of left shoulder, not elsewhere classified  Muscle weakness (generalized)  Abnormal posture  Other symptoms and signs involving the musculoskeletal system     Problem List Patient Active Problem List   Diagnosis Date Noted  . Bursal cyst of olecranon 10/11/2020  . Family history of diabetes mellitus in father 09/10/2020  . Subacromial bursitis of left shoulder joint 06/13/2020  . Patellofemoral pain syndrome of left knee 03/23/2020  . Cervical disc disorder with radiculopathy of cervical region 10/12/2018  . Hyperlipidemia 09/07/2018  . Hip injury, right, initial encounter 07/03/2018  . Right hip pain 05/07/2018  . Sleep difficulties 06/06/2017  . Constipation 06/06/2017  . Insomnia 04/03/2017  . 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 Low back pain 02/13/2017  . Right sided sciatica 02/13/2017  . RLQ abdominal pain 08/20/2016  . Anal itching 08/20/2016  . GERD (gastroesophageal reflux disease) 06/05/2016  . Migraine 12/27/2015  . Headache 12/27/2015  . Lateral epicondylitis of left elbow 06/27/2015  . Cervical radiculitis 06/27/2015  . Non-allergic vasomotor rhinitis 06/01/2012    Percival Spanish, PT, MPT 11/16/2020, 8:28 PM  [facility] [address]  [place], [place], [address] Phone: [phone]   Fax:  [phone]  Name: LACHLAN PELTO MRN: [id number] Date of Birth: 1961/08/31

## 2020-11-21 ENCOUNTER — Other Ambulatory Visit: Payer: Self-pay

## 2020-11-21 ENCOUNTER — Encounter: Payer: Self-pay | Admitting: Physical Therapy

## 2020-11-21 ENCOUNTER — Ambulatory Visit: Payer: 59 | Attending: Family Medicine | Admitting: Physical Therapy

## 2020-11-21 DIAGNOSIS — M25512 Pain in left shoulder: Secondary | ICD-10-CM | POA: Diagnosis not present

## 2020-11-21 DIAGNOSIS — R29898 Other symptoms and signs involving the musculoskeletal system: Secondary | ICD-10-CM | POA: Insufficient documentation

## 2020-11-21 DIAGNOSIS — M25612 Stiffness of left shoulder, not elsewhere classified: Secondary | ICD-10-CM | POA: Insufficient documentation

## 2020-11-21 DIAGNOSIS — R293 Abnormal posture: Secondary | ICD-10-CM | POA: Insufficient documentation

## 2020-11-21 DIAGNOSIS — G8929 Other chronic pain: Secondary | ICD-10-CM | POA: Diagnosis not present

## 2020-11-21 DIAGNOSIS — M6281 Muscle weakness (generalized): Secondary | ICD-10-CM | POA: Insufficient documentation

## 2020-11-21 NOTE — Therapy (Signed)
[facility] [address]  [place], [place], [address] Phone: [phone]   Fax:  [phone]  Physical Therapy Treatment  Patient Details  Name: Mindy [name] MRN: [id number] Date of Birth: Apr 13, 1961 Referring Provider (PT): Rosemarie Ax, MD   Encounter Date: 11/21/2020   PT End of Session - 11/21/20 1700    Visit Number 6    Number of Visits 12    Date for PT Re-Evaluation 12/11/20    Authorization Type [facility]    PT Start Time 1700    PT Stop Time 1740    PT Time Calculation (min) 40 min    Activity Tolerance Patient tolerated treatment well    Behavior During Therapy [facility] for tasks assessed/performed           Past Medical History:  Diagnosis Date  . Allergic rhinitis   . Complication of anesthesia   . Elevated transaminase level 12.2014   ?etiology - present 09/2013 hosp for epigastric pain  . Family history of adverse reaction to anesthesia    father - PONV  . GERD (gastroesophageal reflux disease)   . History of bronchitis 10/2014  . Migraine   . PONV (postoperative nausea and vomiting)   . Seasonal allergies     Past Surgical History:  Procedure Laterality Date  . BREAST BIOPSY Right   . BREAST EXCISIONAL BIOPSY    . BREAST LUMPECTOMY WITH RADIOACTIVE SEED LOCALIZATION Right 10/08/2016   Procedure: RIGHT BREAST LUMPECTOMY WITH RADIOACTIVE SEED LOCALIZATION;  Surgeon: Coralie Keens, MD;  Location: [place];  Service: General;  Laterality: Right;  . CHOLECYSTECTOMY N/A 09/21/2015   Procedure: LAPAROSCOPIC CHOLECYSTECTOMY;  Surgeon: Coralie Keens, MD;  Location: [place];  Service: General;  Laterality: N/A;  . COLONOSCOPY  12/2013  . NECK SURGERY  2009  . PARTIAL HYSTERECTOMY  2007  . UPPER GI ENDOSCOPY  2016    There were no vitals filed for this visit.   Subjective Assessment - 11/21/20 1702    Subjective Pt denies pain on arrival to PT today. States she was able to progress the HEP as  discussed last visit w/o any issues.    Diagnostic tests 08/29/20 - L shoulder MRI:  Mild to moderate appearing supraspinatus and infraspinatus tendinopathy without tear. Small volume of subacromial/subdeltoid fluid compatible with  bursitis.    Patient Stated Goals "to be able to get back to working out the way I want to w/o pain & be able to shower w/o shoulder hurting"    Currently in Pain? No/denies              [facility] PT Assessment - 11/21/20 1700      AROM   Left Shoulder Flexion 152 Degrees    Left Shoulder ABduction 124 Degrees    Left Shoulder Internal Rotation 82 Degrees   FIR to T10   Left Shoulder External Rotation 80 Degrees   FER to T3                        [facility] Adult PT Treatment/Exercise - 11/21/20 1700      Exercises   Exercises Shoulder      Shoulder Exercises: Prone   Extension Both;10 reps;Strengthening    Extension Limitations I's over green Pball - cues for scap retraction    External Rotation Both;10 reps;Strengthening    External Rotation Limitations W's over green Pball - cues for scap retraction  Horizontal ABduction 1 Both;10 reps;Strengthening    Horizontal ABduction 1 Limitations T's over green Pball - cues for scap retraction    Horizontal ABduction 2 Both;10 reps;Strengthening    Horizontal ABduction 2 Limitations Y's over green Pball - cues for scap retraction      Shoulder Exercises: Standing   External Rotation Left;10 reps;Strengthening;Theraband;AROM    Theraband Level (Shoulder External Rotation) Level 1 (Yellow)    Internal Rotation Left;10 reps;Strengthening;Theraband;AROM    Theraband Level (Shoulder Internal Rotation) Level 1 (Yellow)      Shoulder Exercises: ROM/Strengthening   UBE (Upper Arm Bike) L1.5 x 6 min (3' fwd/3' back)    Wall Pushups 10 reps    Ball on Wall L shoulder CW/CCW circles x 10 each and A-Z x 1 set at 90 flexion      Shoulder Exercises: Stretch   Cross Chest Stretch 2 reps    Cross Chest  Stretch Limitations L posterior capsule stretch - arm at 90-90 in front on body and holding onto doorframe - pt preferring the latter                    PT Short Term Goals - 11/16/20 1621      PT SHORT TERM GOAL #1   Title Patient will be independent with initial HEP    Status Achieved   11/14/20            PT Long Term Goals - 11/16/20 1655      PT LONG TERM GOAL #1   Title Patient will be independent with ongoing/advanced HEP for self-management at home    Status Partially Met    Target Date 12/11/20      PT LONG TERM GOAL #2   Title Improve posture and alignment with patient to demonstrate improved upright posture with posterior shoulder girdle engaged    Status Partially Met    Target Date 12/11/20      PT LONG TERM GOAL #3   Title Decrease pain in the L shoulder girdle by 50-75% allowing patient to use L UE for functional activities    Status Partially Met    Target Date 12/11/20      PT LONG TERM GOAL #4   Title Patient to improve L shoulder AROM to WFL/WNL without pain provocation    Status On-going    Target Date 12/11/20      PT LONG TERM GOAL #5   Title Patient will demonstrate improved L shoulder strength to >/= 4 to 4+/5 for functional UE use    Status On-going    Target Date 12/11/20      PT LONG TERM GOAL #6   Title Patient to report ability to perform ADLs, household, and work-related tasks without limitation due to L shoulder pain, LOM or weakness    Status On-going    Target Date 12/11/20      PT LONG TERM GOAL #7   Title Patient to return to working out w/o limitation due to L shoulder pain    Status On-going    Target Date 12/11/20                 Plan - 11/21/20 1740    Clinical Impression Statement Mindy [name] reporting good tolerance for HEP progression discussed last visit and was able to complete all exercises w/o increased pain. Continue progression of postural strengthening with introduction of prone scap retraction exercises  over green Pball with good symmetrical motion observed. Pt able to  advance to L shoulder IR & ER to AROM with yellow TB from isometric step-outs - pt noting stretching feeling with ER but denies increased pain. Mindy [name] is progressing well with L shoulder ROM - flexion and functional IR/ER essentially symmetrical to R but abduction still limited with pt still tending to hike shoulder with effort and noting continued weakness on L.    Comorbidities Cervical DDD with radiculitis s/p ACDF 2009, LBP with R sciatica, migraine, L lateral epicondylitis    Rehab Potential Good    PT Frequency 2x / week    PT Duration 6 weeks    PT Treatment/Interventions ADLs/Self Care Home Management;Cryotherapy;Electrical Stimulation;Iontophoresis 4mg /ml Dexamethasone;Moist Heat;Ultrasound;Therapeutic activities;Therapeutic exercise;Neuromuscular re-education;Patient/family education;Manual techniques;Passive range of motion;Dry needling;Taping    PT Next Visit Plan postural stretching/strengthening, updating HEP as tolerated; manual therapy with possible DN to reduce muscle tension; modalities PRN    Consulted and Agree with Plan of Care Patient           Patient will benefit from skilled therapeutic intervention in order to improve the following deficits and impairments:  Decreased activity tolerance,Decreased endurance,Decreased range of motion,Decreased strength,Increased fascial restricitons,Increased muscle spasms,Impaired perceived functional ability,Impaired flexibility,Impaired sensation,Impaired UE functional use,Improper body mechanics,Postural dysfunction,Pain  Visit Diagnosis: Chronic left shoulder pain  Stiffness of left shoulder, not elsewhere classified  Muscle weakness (generalized)  Abnormal posture  Other symptoms and signs involving the musculoskeletal system     Problem List Patient Active Problem List   Diagnosis Date Noted  . Bursal cyst of olecranon 10/11/2020  . Family history of  diabetes mellitus in father 09/10/2020  . Subacromial bursitis of left shoulder joint 06/13/2020  . Patellofemoral pain syndrome of left knee 03/23/2020  . Cervical disc disorder with radiculopathy of cervical region 10/12/2018  . Hyperlipidemia 09/07/2018  . Hip injury, right, initial encounter 07/03/2018  . Right hip pain 05/07/2018  . Sleep difficulties 06/06/2017  . Constipation 06/06/2017  . Insomnia 04/03/2017  . 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 Low back pain 02/13/2017  . Right sided sciatica 02/13/2017  . RLQ abdominal pain 08/20/2016  . Anal itching 08/20/2016  . GERD (gastroesophageal reflux disease) 06/05/2016  . Migraine 12/27/2015  . Headache 12/27/2015  . Lateral epicondylitis of left elbow 06/27/2015  . Cervical radiculitis 06/27/2015  . Non-allergic vasomotor rhinitis 06/01/2012    Percival Spanish, PT, MPT 11/21/2020, 5:53 PM  [facility] [address]  [place], [place], [address] Phone: [phone]   Fax:  [phone]  Name: Mindy [name] MRN: [id number] Date of Birth: 05-08-1961

## 2020-11-23 ENCOUNTER — Ambulatory Visit: Payer: 59 | Admitting: Physical Therapy

## 2020-11-23 ENCOUNTER — Other Ambulatory Visit: Payer: Self-pay

## 2020-11-23 ENCOUNTER — Encounter: Payer: Self-pay | Admitting: Physical Therapy

## 2020-11-23 DIAGNOSIS — R293 Abnormal posture: Secondary | ICD-10-CM

## 2020-11-23 DIAGNOSIS — G8929 Other chronic pain: Secondary | ICD-10-CM | POA: Diagnosis not present

## 2020-11-23 DIAGNOSIS — R29898 Other symptoms and signs involving the musculoskeletal system: Secondary | ICD-10-CM | POA: Diagnosis not present

## 2020-11-23 DIAGNOSIS — M6281 Muscle weakness (generalized): Secondary | ICD-10-CM

## 2020-11-23 DIAGNOSIS — M25612 Stiffness of left shoulder, not elsewhere classified: Secondary | ICD-10-CM | POA: Diagnosis not present

## 2020-11-23 DIAGNOSIS — M25512 Pain in left shoulder: Secondary | ICD-10-CM | POA: Diagnosis not present

## 2020-11-23 NOTE — Therapy (Signed)
[facility] [address]  [place], [place], [address] Phone: [phone]   Fax:  [phone]  Physical Therapy Treatment  Patient Details  Name: Mindy [name] MRN: [id number] Date of Birth: [birth year] Referring Provider (PT): Rosemarie Ax, MD   Encounter Date: 11/23/2020   PT End of Session - 11/23/20 1615    Visit Number 7    Number of Visits 12    Date for PT Re-Evaluation 12/11/20    Authorization Type [facility]    PT Start Time 1615    PT Stop Time 1659    PT Time Calculation (min) 44 min    Activity Tolerance Patient tolerated treatment well    Behavior During Therapy [facility] for tasks assessed/performed           Past Medical History:  Diagnosis Date  . Allergic rhinitis   . Complication of anesthesia   . Elevated transaminase level 12.2014   ?etiology - present 09/2013 hosp for epigastric pain  . Family history of adverse reaction to anesthesia    father - PONV  . GERD (gastroesophageal reflux disease)   . History of bronchitis 10/2014  . Migraine   . PONV (postoperative nausea and vomiting)   . Seasonal allergies     Past Surgical History:  Procedure Laterality Date  . BREAST BIOPSY Right   . BREAST EXCISIONAL BIOPSY    . BREAST LUMPECTOMY WITH RADIOACTIVE SEED LOCALIZATION Right 10/08/2016   Procedure: RIGHT BREAST LUMPECTOMY WITH RADIOACTIVE SEED LOCALIZATION;  Surgeon: Coralie Keens, MD;  Location: [place];  Service: General;  Laterality: Right;  . CHOLECYSTECTOMY N/A 09/21/2015   Procedure: LAPAROSCOPIC CHOLECYSTECTOMY;  Surgeon: Coralie Keens, MD;  Location: [place];  Service: General;  Laterality: N/A;  . COLONOSCOPY  12/2013  . NECK SURGERY  2009  . PARTIAL HYSTERECTOMY  2007  . UPPER GI ENDOSCOPY  2016    There were no vitals filed for this visit.   Subjective Assessment - 11/23/20 1617    Subjective No concerns reported today.    Diagnostic tests 08/29/20 - L shoulder MRI:  Mild to  moderate appearing supraspinatus and infraspinatus tendinopathy without tear. Small volume of subacromial/subdeltoid fluid compatible with  bursitis.    Patient Stated Goals "to be able to get back to working out the way I want to w/o pain & be able to shower w/o shoulder hurting"    Currently in Pain? No/denies                             [facility] Adult PT Treatment/Exercise - 11/23/20 1615      Exercises   Exercises Shoulder      Shoulder Exercises: Supine   Protraction Both;10 reps;AROM    Protraction Limitations pt noing "tingling" in L 4th digit following exercise      Shoulder Exercises: Seated   Protraction Both;5 reps;Strengthening;Theraband    Theraband Level (Shoulder Protraction) Level 1 (Yellow)    Protraction Limitations pt noting onset of pain after a few reps      Shoulder Exercises: Prone   Extension Both;10 reps;Strengthening    Extension Limitations I's + slight thoracic extension over corner of mat table  - cues for scap retraction    External Rotation Both;10 reps;Strengthening    External Rotation Limitations W's over corner of mat table with forehead resting on towel roll at corner - cues for scap retraction  Horizontal ABduction 1 Both;10 reps;Strengthening    Horizontal ABduction 1 Limitations T's over corner of mat table - cues for scap retraction    Horizontal ABduction 2 Both;10 reps;Strengthening    Horizontal ABduction 2 Limitations Y's over corner of mat table with forehead resting on towel roll at corner - cues for scap retraction    Other Prone Exercises Serratus rocks on BOSU over edge of mat table x 10      Shoulder Exercises: Sidelying   External Rotation Left;10 reps;AROM   2 sets   External Rotation Weight (lbs) --   increased pain when attempted with 1# weight   External Rotation Limitations cues for scap retraction; towel roll under elbow    ABduction Left;10 reps;AAROM;AROM   3 sets   ABduction Limitations slight scaption -  1 set with PT providing MWM for scapular motion, last 2 sets AROM    Other Sidelying Exercises L shoulder CW/CCW circles at 90 abduction/scaption 2 x 10, 1# wt on 2nd set      Shoulder Exercises: Standing   External Rotation Left;10 reps;Strengthening;Theraband;AROM    Theraband Level (Shoulder External Rotation) Level 1 (Yellow)    Internal Rotation Left;10 reps;Strengthening;Theraband;AROM    Theraband Level (Shoulder Internal Rotation) Level 1 (Yellow)      Shoulder Exercises: ROM/Strengthening   UBE (Upper Arm Bike) L2.0 x 6 min (3' fwd/3' back)      Manual Therapy   Manual Therapy Soft tissue mobilization;Myofascial release;Scapular mobilization    Soft tissue mobilization STM to L UT    Myofascial Release manual TPR to L UT    Scapular Mobilization L scapula MWM during L shoulder abduction in sidelying to facilitate proper scapulohumeral rhythm                    PT Short Term Goals - 11/16/20 1621      PT SHORT TERM GOAL #1   Title Patient will be independent with initial HEP    Status Achieved   11/14/20            PT Long Term Goals - 11/16/20 1655      PT LONG TERM GOAL #1   Title Patient will be independent with ongoing/advanced HEP for self-management at home    Status Partially Met    Target Date 12/11/20      PT LONG TERM GOAL #2   Title Improve posture and alignment with patient to demonstrate improved upright posture with posterior shoulder girdle engaged    Status Partially Met    Target Date 12/11/20      PT LONG TERM GOAL #3   Title Decrease pain in the L shoulder girdle by 50-75% allowing patient to use L UE for functional activities    Status Partially Met    Target Date 12/11/20      PT LONG TERM GOAL #4   Title Patient to improve L shoulder AROM to WFL/WNL without pain provocation    Status On-going    Target Date 12/11/20      PT LONG TERM GOAL #5   Title Patient will demonstrate improved L shoulder strength to >/= 4 to 4+/5 for  functional UE use    Status On-going    Target Date 12/11/20      PT LONG TERM GOAL #6   Title Patient to report ability to perform ADLs, household, and work-related tasks without limitation due to L shoulder pain, LOM or weakness    Status On-going  Target Date 12/11/20      PT LONG TERM GOAL #7   Title Patient to return to working out w/o limitation due to L shoulder pain    Status On-going    Target Date 12/11/20                 Plan - 11/23/20 1619    Clinical Impression Statement Mindy [name] continues to report decreased pain with improving exercise tolerance but remains very sensitive to progression of resistance with strengthening exercises due to pain. She was able to achieve improved abduction AROM in sidelying today after initial manual facilitation/MWM by PT to promote appropriate scapulohumeral rhythm. Limited tolerance for scapular protraction/retraction with pain limiting seated attempts with yellow TB and supine efforts resulting in tingling noted in L 4th digit. TP identified in L UT which responded well to manual TPR. Will continue to slowly progress scapular stabilization, L shoulder AROM and RTC strengthening within pain tolerance.    Comorbidities Cervical DDD with radiculitis s/p ACDF 2009, LBP with R sciatica, migraine, L lateral epicondylitis    Rehab Potential Good    PT Frequency 2x / week    PT Duration 6 weeks    PT Treatment/Interventions ADLs/Self Care Home Management;Cryotherapy;Electrical Stimulation;Iontophoresis 4mg /ml Dexamethasone;Moist Heat;Ultrasound;Therapeutic activities;Therapeutic exercise;Neuromuscular re-education;Patient/family education;Manual techniques;Passive range of motion;Dry needling;Taping    PT Next Visit Plan postural stretching/strengthening & progress L shoulder AROM & strengthening, updating HEP as tolerated; manual therapy with DN as indicated to reduce muscle tension; modalities PRN    Consulted and Agree with Plan of Care Patient            Patient will benefit from skilled therapeutic intervention in order to improve the following deficits and impairments:  Decreased activity tolerance,Decreased endurance,Decreased range of motion,Decreased strength,Increased fascial restricitons,Increased muscle spasms,Impaired perceived functional ability,Impaired flexibility,Impaired sensation,Impaired UE functional use,Improper body mechanics,Postural dysfunction,Pain  Visit Diagnosis: Chronic left shoulder pain  Stiffness of left shoulder, not elsewhere classified  Muscle weakness (generalized)  Abnormal posture  Other symptoms and signs involving the musculoskeletal system     Problem List Patient Active Problem List   Diagnosis Date Noted  . Bursal cyst of olecranon 10/11/2020  . Family history of diabetes mellitus in father 09/10/2020  . Subacromial bursitis of left shoulder joint 06/13/2020  . Patellofemoral pain syndrome of left knee 03/23/2020  . Cervical disc disorder with radiculopathy of cervical region 10/12/2018  . Hyperlipidemia 09/07/2018  . Hip injury, right, initial encounter 07/03/2018  . Right hip pain 05/07/2018  . Sleep difficulties 06/06/2017  . Constipation 06/06/2017  . Insomnia 04/03/2017  . 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 Low back pain 02/13/2017  . Right sided sciatica 02/13/2017  . RLQ abdominal pain 08/20/2016  . Anal itching 08/20/2016  . GERD (gastroesophageal reflux disease) 06/05/2016  . Migraine 12/27/2015  . Headache 12/27/2015  . Lateral epicondylitis of left elbow 06/27/2015  . Cervical radiculitis 06/27/2015  . Non-allergic vasomotor rhinitis 06/01/2012    Percival Spanish, PT, MPT 11/23/2020, 7:41 PM  [facility] [address]  [place], [place], [address] Phone: [phone]   Fax:  [phone]  Name: Mindy [name] MRN: [id number] Date of Birth:  06/15/1961

## 2020-11-27 MED FILL — ELETRIPTAN HYDROBROMIDE 40: 40 | 25 days supply | Qty: 10 | Fill #2

## 2020-11-27 MED FILL — BUTALB-ACETAMIN-CAFF 50-325: 50-325-40 | 19 days supply | Qty: 75 | Fill #1

## 2020-11-27 MED FILL — PREMARIN 0.9 MG TABLET: 0.9 | 90 days supply | Qty: 90 | Fill #1

## 2020-11-28 ENCOUNTER — Ambulatory Visit: Payer: 59

## 2020-11-28 ENCOUNTER — Other Ambulatory Visit: Payer: Self-pay

## 2020-11-28 DIAGNOSIS — M25512 Pain in left shoulder: Secondary | ICD-10-CM

## 2020-11-28 DIAGNOSIS — R29898 Other symptoms and signs involving the musculoskeletal system: Secondary | ICD-10-CM

## 2020-11-28 DIAGNOSIS — G8929 Other chronic pain: Secondary | ICD-10-CM | POA: Diagnosis not present

## 2020-11-28 DIAGNOSIS — M25612 Stiffness of left shoulder, not elsewhere classified: Secondary | ICD-10-CM

## 2020-11-28 DIAGNOSIS — R293 Abnormal posture: Secondary | ICD-10-CM | POA: Diagnosis not present

## 2020-11-28 DIAGNOSIS — M6281 Muscle weakness (generalized): Secondary | ICD-10-CM

## 2020-11-28 NOTE — Therapy (Signed)
[facility] [address]  [place], [place], [address] Phone: [phone]   Fax:  [phone]  Physical Therapy Treatment  Patient Details  Name: Mindy [name] MRN: [id number] Date of Birth: 11-11-1960 Referring Provider (PT): Rosemarie Ax, MD   Encounter Date: 11/28/2020   PT End of Session - 11/28/20 9509    Visit Number 8    Number of Visits 12    Date for PT Re-Evaluation 12/11/20    Authorization Type [facility]    PT Start Time 1705    PT Stop Time 1745    PT Time Calculation (min) 40 min    Activity Tolerance Patient tolerated treatment well    Behavior During Therapy [facility] for tasks assessed/performed           Past Medical History:  Diagnosis Date  . Allergic rhinitis   . Complication of anesthesia   . Elevated transaminase level 12.2014   ?etiology - present 09/2013 hosp for epigastric pain  . Family history of adverse reaction to anesthesia    father - PONV  . GERD (gastroesophageal reflux disease)   . History of bronchitis 10/2014  . Migraine   . PONV (postoperative nausea and vomiting)   . Seasonal allergies     Past Surgical History:  Procedure Laterality Date  . BREAST BIOPSY Right   . BREAST EXCISIONAL BIOPSY    . BREAST LUMPECTOMY WITH RADIOACTIVE SEED LOCALIZATION Right 10/08/2016   Procedure: RIGHT BREAST LUMPECTOMY WITH RADIOACTIVE SEED LOCALIZATION;  Surgeon: Coralie Keens, MD;  Location: [place];  Service: General;  Laterality: Right;  . CHOLECYSTECTOMY N/A 09/21/2015   Procedure: LAPAROSCOPIC CHOLECYSTECTOMY;  Surgeon: Coralie Keens, MD;  Location: [place];  Service: General;  Laterality: N/A;  . COLONOSCOPY  12/2013  . NECK SURGERY  2009  . PARTIAL HYSTERECTOMY  2007  . UPPER GI ENDOSCOPY  2016    There were no vitals filed for this visit.   Subjective Assessment - 11/28/20 1711    Subjective No concerns reported today. Still gets some pain with certian movements (picking up  things away from the body, moving arm across body)    Limitations House hold activities    Diagnostic tests 08/29/20 - L shoulder MRI:  Mild to moderate appearing supraspinatus and infraspinatus tendinopathy without tear. Small volume of subacromial/subdeltoid fluid compatible with  bursitis.    Patient Stated Goals "to be able to get back to working out the way I want to w/o pain & be able to shower w/o shoulder hurting"    Currently in Pain? No/denies    Pain Score 0-No pain   3-2/67 today with certain movments.   Pain Location Shoulder    Pain Orientation Left    Pain Descriptors / Indicators Tightness    Pain Type Acute pain    Pain Radiating Towards front of upper arm, sometimes down back of arm.    Pain Onset More than a month ago                             [facility] Adult PT Treatment/Exercise - 11/28/20 1707      Shoulder Exercises: Seated   Protraction Both;5 reps;Strengthening;Theraband    Theraband Level (Shoulder Protraction) Level 1 (Yellow)      Shoulder Exercises: Prone   External Rotation Limitations W's over corner of mat table with forehead resting on towel roll at corner - cues for  scap retraction    Horizontal ABduction 1 Both;10 reps;Strengthening    Horizontal ABduction 1 Limitations T's over corner of mat table - cues for scap retraction    Horizontal ABduction 2 Both;10 reps;Strengthening    Horizontal ABduction 2 Limitations Y's over corner of mat table with forehead resting on towel roll at corner - cues for scap retraction      Shoulder Exercises: Sidelying   External Rotation Left;10 reps;AROM    External Rotation Weight (lbs) 0,1   No c/o increased pain with 1# wt tday   External Rotation Limitations cues for scap retraction; towel roll under elbow    ABduction Left;10 reps;AAROM;AROM    ABduction Limitations slight scaption - 1 set with PT providing MWM for scapular motion, last 2 sets AROM      Shoulder Exercises: Standing   External  Rotation Left;10 reps;Strengthening;Theraband;AROM    Theraband Level (Shoulder External Rotation) Level 1 (Yellow)    Internal Rotation Left;10 reps;Strengthening;Theraband;AROM    Theraband Level (Shoulder Internal Rotation) Level 1 (Yellow)    Other Standing Exercises Standing Is Ys x10 each direction   fatigue   Other Standing Exercises ER/IR isometrics with walk outs x 10      Shoulder Exercises: Pulleys   Flexion 3 minutes      Shoulder Exercises: ROM/Strengthening   UBE (Upper Arm Bike) L2.0 x 6 min (2 fwd/2 back)    Wall Pushups 10 reps    Ball on Wall L shoulder CW/CCW circles x20 each at 90 deg flexion      Manual Therapy   Manual Therapy Soft tissue mobilization;Myofascial release;Scapular mobilization    Soft tissue mobilization STM to L UT, lateral posterior shoulder, gentle distraction with small range shoulder ROM    Myofascial Release manual TPR to L UT    Scapular Mobilization L scapula MWM during L shoulder abduction in sidelying to facilitate proper scapulohumeral rhythm                    PT Short Term Goals - 11/16/20 1621      PT SHORT TERM GOAL #1   Title Patient will be independent with initial HEP    Status Achieved   11/14/20            PT Long Term Goals - 11/16/20 1655      PT LONG TERM GOAL #1   Title Patient will be independent with ongoing/advanced HEP for self-management at home    Status Partially Met    Target Date 12/11/20      PT LONG TERM GOAL #2   Title Improve posture and alignment with patient to demonstrate improved upright posture with posterior shoulder girdle engaged    Status Partially Met    Target Date 12/11/20      PT LONG TERM GOAL #3   Title Decrease pain in the L shoulder girdle by 50-75% allowing patient to use L UE for functional activities    Status Partially Met    Target Date 12/11/20      PT LONG TERM GOAL #4   Title Patient to improve L shoulder AROM to WFL/WNL without pain provocation    Status  On-going    Target Date 12/11/20      PT LONG TERM GOAL #5   Title Patient will demonstrate improved L shoulder strength to >/= 4 to 4+/5 for functional UE use    Status On-going    Target Date 12/11/20  PT LONG TERM GOAL #6   Title Patient to report ability to perform ADLs, household, and work-related tasks without limitation due to L shoulder pain, LOM or weakness    Status On-going    Target Date 12/11/20      PT LONG TERM GOAL #7   Title Patient to return to working out w/o limitation due to L shoulder pain    Status On-going    Target Date 12/11/20                 Plan - 11/28/20 1748    Clinical Impression Statement Pt with no pain at baseline but pain with end ranges ER, ABD. Areas of tenderness noted along posterior shoulder and anterior shoulder (pec minor, anterior delt). he tolerated exercises and manual therapy well today, exercises focused on scapular stabilization and shoulder strengthening. Continue per POC.    Personal Factors and Comorbidities Time since onset of injury/illness/exacerbation;Past/Current Experience;Profession;Comorbidity 3+    Comorbidities Cervical DDD with radiculitis s/p ACDF 2009, LBP with R sciatica, migraine, L lateral epicondylitis    Examination-Activity Limitations Bathing;Lift;Carry;Reach Overhead    Examination-Participation Restrictions Cleaning;Driving;Laundry;Occupation    Rehab Potential Good    PT Frequency 2x / week    PT Duration 6 weeks    PT Treatment/Interventions ADLs/Self Care Home Management;Cryotherapy;Electrical Stimulation;Iontophoresis 4mg /ml Dexamethasone;Moist Heat;Ultrasound;Therapeutic activities;Therapeutic exercise;Neuromuscular re-education;Patient/family education;Manual techniques;Passive range of motion;Dry needling;Taping    PT Next Visit Plan postural stretching/strengthening & progress L shoulder AROM & strengthening, updating HEP as tolerated; manual therapy with DN as indicated to reduce muscle  tension; modalities PRN    Consulted and Agree with Plan of Care Patient           Patient will benefit from skilled therapeutic intervention in order to improve the following deficits and impairments:  Decreased activity tolerance,Decreased endurance,Decreased range of motion,Decreased strength,Increased fascial restricitons,Increased muscle spasms,Impaired perceived functional ability,Impaired flexibility,Impaired sensation,Impaired UE functional use,Improper body mechanics,Postural dysfunction,Pain  Visit Diagnosis: Chronic left shoulder pain  Muscle weakness (generalized)  Abnormal posture  Other symptoms and signs involving the musculoskeletal system  Stiffness of left shoulder, not elsewhere classified     Problem List Patient Active Problem List   Diagnosis Date Noted  . Bursal cyst of olecranon 10/11/2020  . Family history of diabetes mellitus in father 09/10/2020  . Subacromial bursitis of left shoulder joint 06/13/2020  . Patellofemoral pain syndrome of left knee 03/23/2020  . Cervical disc disorder with radiculopathy of cervical region 10/12/2018  . Hyperlipidemia 09/07/2018  . Hip injury, right, initial encounter 07/03/2018  . Right hip pain 05/07/2018  . Sleep difficulties 06/06/2017  . Constipation 06/06/2017  . Insomnia 04/03/2017  . 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 Low back pain 02/13/2017  . Right sided sciatica 02/13/2017  . RLQ abdominal pain 08/20/2016  . Anal itching 08/20/2016  . GERD (gastroesophageal reflux disease) 06/05/2016  . Migraine 12/27/2015  . Headache 12/27/2015  . Lateral epicondylitis of left elbow 06/27/2015  . Cervical radiculitis 06/27/2015  . Non-allergic vasomotor rhinitis 06/01/2012    Hall Busing, PT, DPT 11/28/2020, 5:53 PM  [facility] [address]  [place], [place], [address] Phone: [phone]   Fax:   [phone]  Name: Mindy [name] MRN: [id number] Date of Birth: May 01, 1961

## 2020-11-29 IMAGING — MG DIGITAL SCREENING BILAT W/ TOMO W/ CAD
8 series · 8 of 24 positions shown · non-contrast
Comparison: Previous exam(s).

CLINICAL DATA: Screening.

EXAM:
DIGITAL SCREENING BILATERAL MAMMOGRAM WITH TOMO AND CAD

[R CC synth-2D]
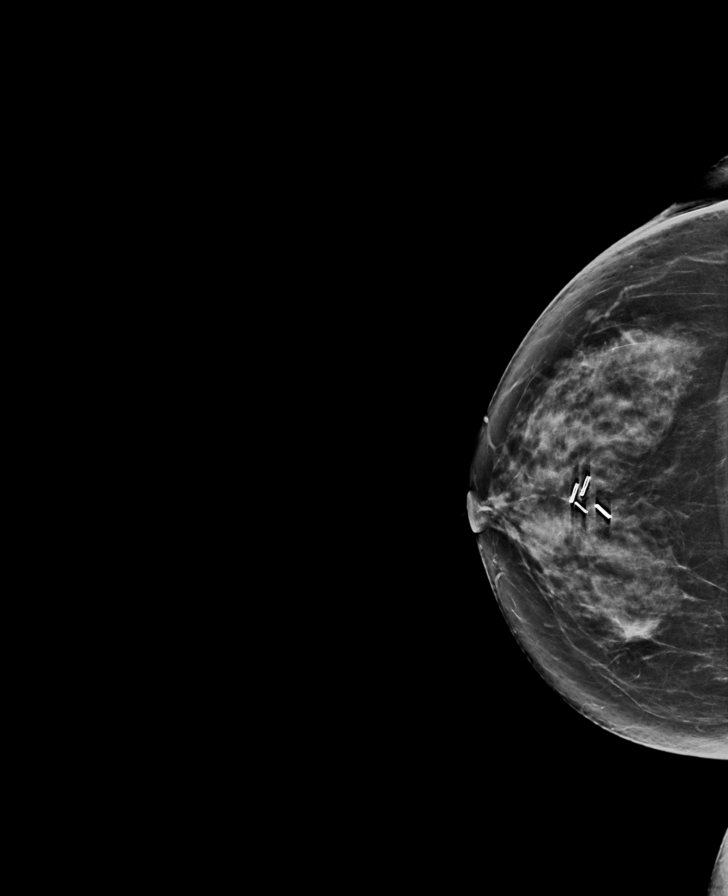

[L CC synth-2D]
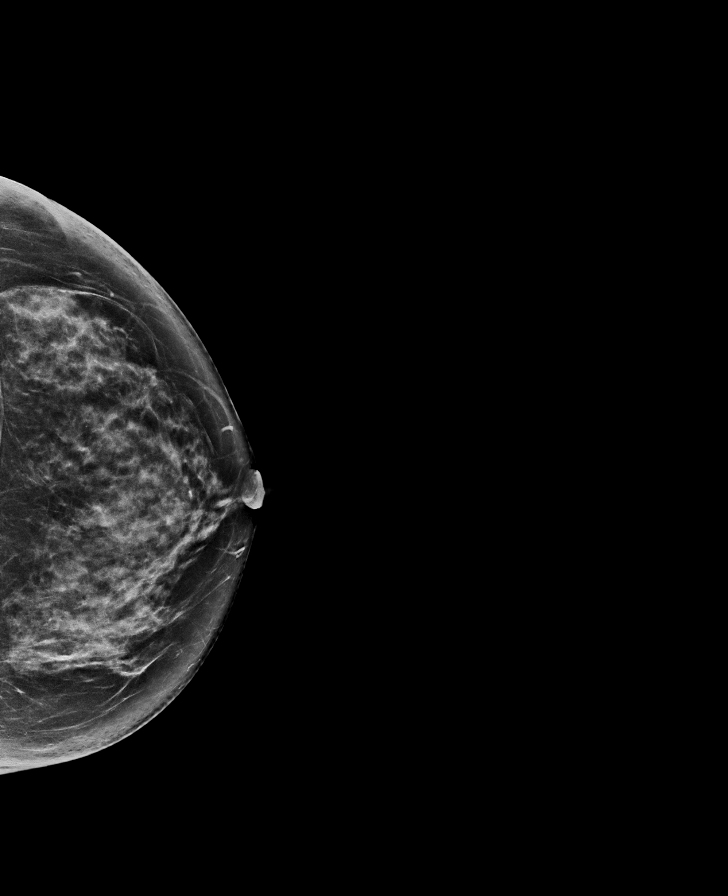

[L MLO synth-2D]
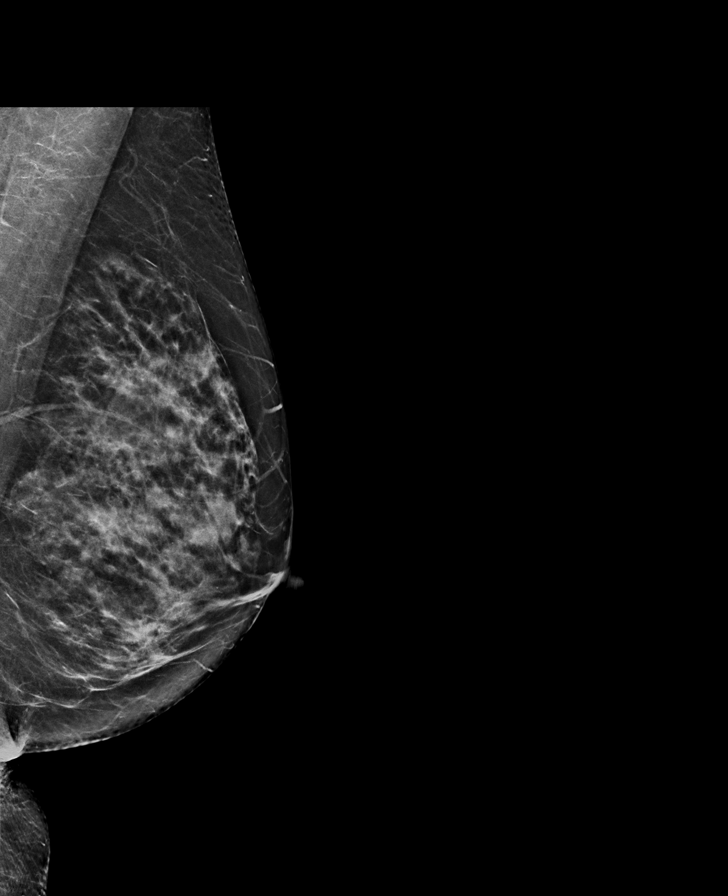

[R MLO synth-2D]
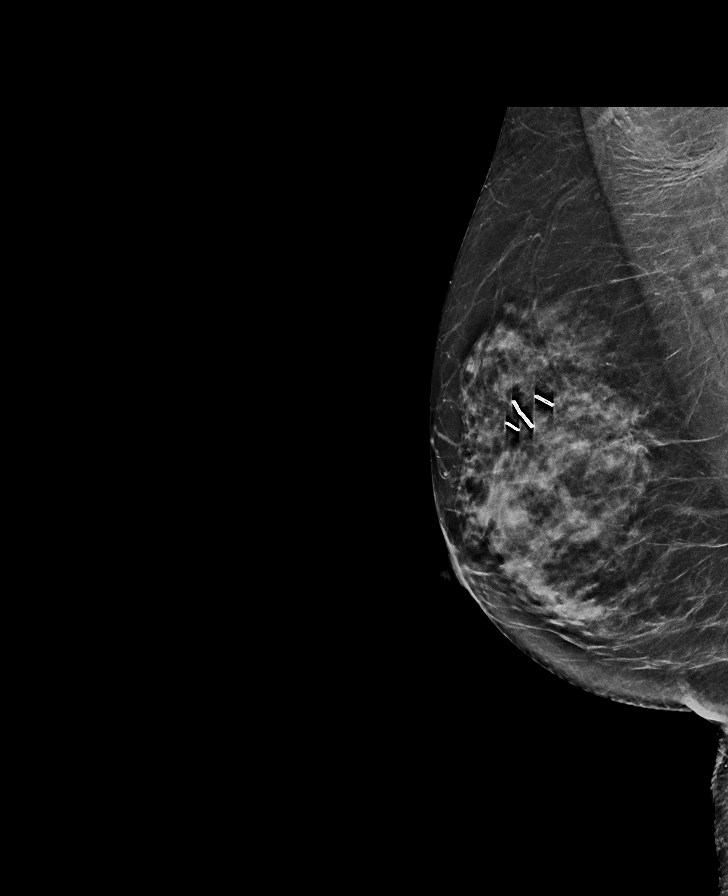

[R CC tomo · tomo slice 35/69.0]
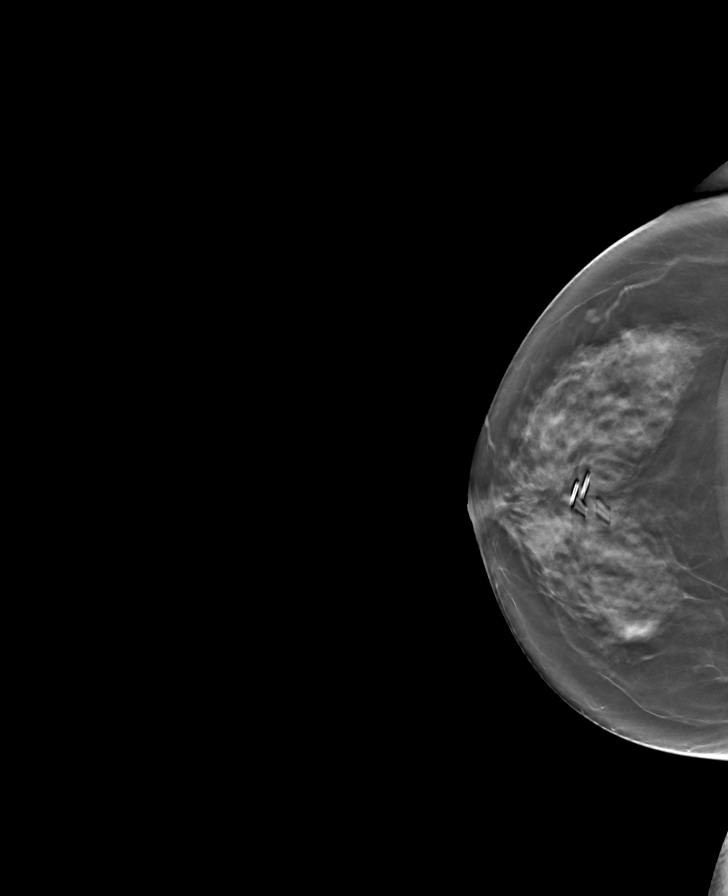

[L MLO tomo · tomo slice 32/63.0]
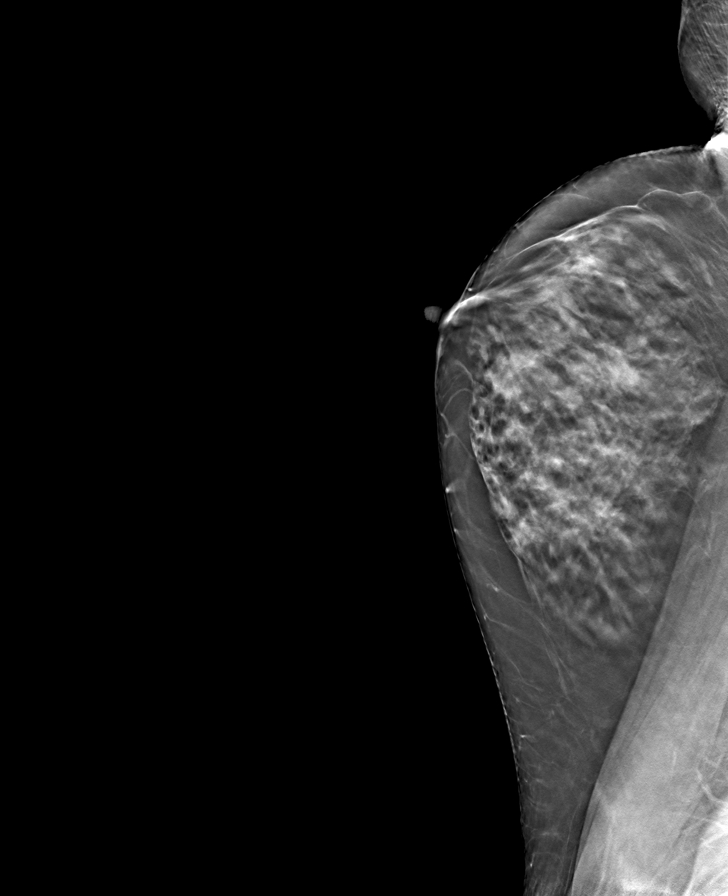

[R MLO tomo · tomo slice 31/62.0]
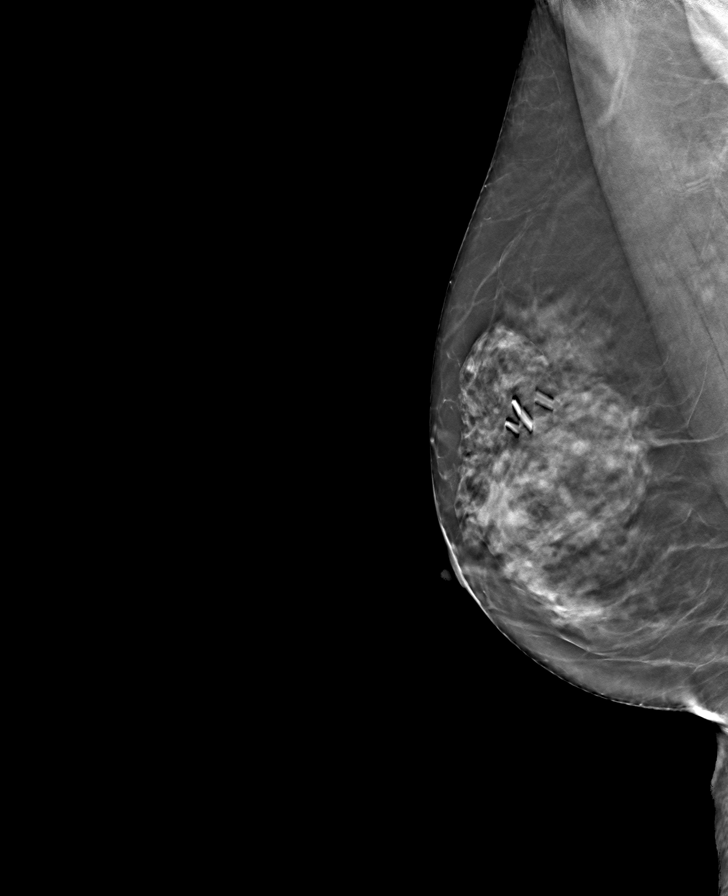

[L CC tomo · tomo slice 38/75.0]
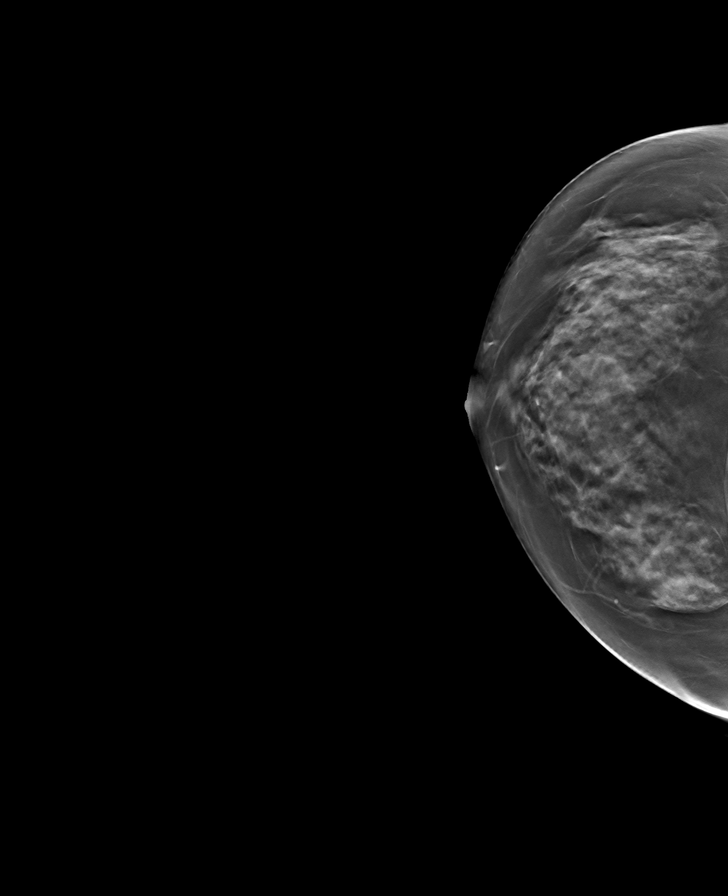

[8 of 24 positions shown; findings below may reference images not displayed]

ACR Breast Density Category c: The breast tissue is heterogeneously
dense, which may obscure small masses.
FINDINGS: There are no findings suspicious for malignancy. Images were
processed with CAD.
IMPRESSION: No mammographic evidence of malignancy. A result letter of this
screening mammogram will be mailed directly to the patient.

RECOMMENDATION:
Screening mammogram in one year. (Code:FT-U-LHB)

BI-RADS CATEGORY  1: Negative.

## 2020-11-30 ENCOUNTER — Other Ambulatory Visit: Payer: Self-pay

## 2020-11-30 ENCOUNTER — Ambulatory Visit: Payer: 59

## 2020-11-30 DIAGNOSIS — R293 Abnormal posture: Secondary | ICD-10-CM | POA: Diagnosis not present

## 2020-11-30 DIAGNOSIS — R29898 Other symptoms and signs involving the musculoskeletal system: Secondary | ICD-10-CM

## 2020-11-30 DIAGNOSIS — M25612 Stiffness of left shoulder, not elsewhere classified: Secondary | ICD-10-CM | POA: Diagnosis not present

## 2020-11-30 DIAGNOSIS — M6281 Muscle weakness (generalized): Secondary | ICD-10-CM

## 2020-11-30 DIAGNOSIS — G8929 Other chronic pain: Secondary | ICD-10-CM | POA: Diagnosis not present

## 2020-11-30 DIAGNOSIS — M25512 Pain in left shoulder: Secondary | ICD-10-CM | POA: Diagnosis not present

## 2020-11-30 NOTE — Therapy (Signed)
[facility] [address]  [place], [place], [address] Phone: [phone]   Fax:  [phone]  Physical Therapy Treatment  Patient Details  Name: Mindy [name] MRN: [id number] Date of Birth: Mar 21, 1961 Referring Provider (PT): Rosemarie Ax, MD   Encounter Date: 11/30/2020   PT End of Session - 11/30/20 1713    Visit Number 9    Number of Visits 12    Date for PT Re-Evaluation 12/11/20    Authorization Type [facility]    PT Start Time 1613    PT Stop Time 1702    PT Time Calculation (min) 49 min    Activity Tolerance Patient tolerated treatment well    Behavior During Therapy [facility] for tasks assessed/performed           Past Medical History:  Diagnosis Date  . Allergic rhinitis   . Complication of anesthesia   . Elevated transaminase level 12.2014   ?etiology - present 09/2013 hosp for epigastric pain  . Family history of adverse reaction to anesthesia    father - PONV  . GERD (gastroesophageal reflux disease)   . History of bronchitis 10/2014  . Migraine   . PONV (postoperative nausea and vomiting)   . Seasonal allergies     Past Surgical History:  Procedure Laterality Date  . BREAST BIOPSY Right   . BREAST EXCISIONAL BIOPSY    . BREAST LUMPECTOMY WITH RADIOACTIVE SEED LOCALIZATION Right 10/08/2016   Procedure: RIGHT BREAST LUMPECTOMY WITH RADIOACTIVE SEED LOCALIZATION;  Surgeon: Coralie Keens, MD;  Location: [place];  Service: General;  Laterality: Right;  . CHOLECYSTECTOMY N/A 09/21/2015   Procedure: LAPAROSCOPIC CHOLECYSTECTOMY;  Surgeon: Coralie Keens, MD;  Location: [place];  Service: General;  Laterality: N/A;  . COLONOSCOPY  12/2013  . NECK SURGERY  2009  . PARTIAL HYSTERECTOMY  2007  . UPPER GI ENDOSCOPY  2016    There were no vitals filed for this visit.   Subjective Assessment - 11/30/20 1613    Subjective Pt reports she is in no pain today only has pain while doing cross body movements,  ER, and carrying items.    Diagnostic tests 08/29/20 - L shoulder MRI:  Mild to moderate appearing supraspinatus and infraspinatus tendinopathy without tear. Small volume of subacromial/subdeltoid fluid compatible with  bursitis.    Patient Stated Goals "to be able to get back to working out the way I want to w/o pain & be able to shower w/o shoulder hurting"    Currently in Pain? No/denies    Pain Score 0-No pain    Pain Location Shoulder                             [facility] Adult PT Treatment/Exercise - 11/30/20 0001      Shoulder Exercises: Supine   Other Supine Exercises cross body reaching with 1# weight   15 reps     Shoulder Exercises: Seated   Row Strengthening;Both;15 reps    Row Weight (lbs) 2#    Row Limitations bent over row in sitting    External Rotation Strengthening;15 reps    External Rotation Weight (lbs) 2#    External Rotation Limitations orange yoga ball underneath arm    Other Seated Exercises cross body reaching with green medicine ball   12 reps     Shoulder Exercises: Standing   External Rotation Strengthening;Left;15 reps    Theraband Level (Shoulder  External Rotation) Level 1 (Yellow)    Internal Rotation Strengthening;Left;15 reps    Theraband Level (Shoulder Internal Rotation) Level 1 (Yellow)    Flexion Strengthening;Left;10 reps    Theraband Level (Shoulder Flexion) Level 1 (Yellow)      Shoulder Exercises: ROM/Strengthening   UBE (Upper Arm Bike) L3 x6 min alt fwd and back each min    Nustep L2 x3 min    Ball on Wall B with green medicine ball 15 reps      Manual Therapy   Manual Therapy Soft tissue mobilization;Myofascial release;Scapular mobilization    Soft tissue mobilization rhomboids, UT, levator, ant deltoid                    PT Short Term Goals - 11/16/20 1621      PT SHORT TERM GOAL #1   Title Patient will be independent with initial HEP    Status Achieved   11/14/20            PT Long Term Goals -  11/16/20 1655      PT LONG TERM GOAL #1   Title Patient will be independent with ongoing/advanced HEP for self-management at home    Status Partially Met    Target Date 12/11/20      PT LONG TERM GOAL #2   Title Improve posture and alignment with patient to demonstrate improved upright posture with posterior shoulder girdle engaged    Status Partially Met    Target Date 12/11/20      PT LONG TERM GOAL #3   Title Decrease pain in the L shoulder girdle by 50-75% allowing patient to use L UE for functional activities    Status Partially Met    Target Date 12/11/20      PT LONG TERM GOAL #4   Title Patient to improve L shoulder AROM to WFL/WNL without pain provocation    Status On-going    Target Date 12/11/20      PT LONG TERM GOAL #5   Title Patient will demonstrate improved L shoulder strength to >/= 4 to 4+/5 for functional UE use    Status On-going    Target Date 12/11/20      PT LONG TERM GOAL #6   Title Patient to report ability to perform ADLs, household, and work-related tasks without limitation due to L shoulder pain, LOM or weakness    Status On-going    Target Date 12/11/20      PT LONG TERM GOAL #7   Title Patient to return to working out w/o limitation due to L shoulder pain    Status On-going    Target Date 12/11/20                 Plan - 11/30/20 1714    Clinical Impression Statement Pt was able to complete cross body movements against resistance with occasional reports of pain. More pain observed when going more into shld horizontal adduction and flexion. Pt showed improvements in ability to complete ER against resistance pain free. TTP found all along the L rhomboids, UT, and ant deltoid.    Personal Factors and Comorbidities Time since onset of injury/illness/exacerbation;Past/Current Experience;Profession;Comorbidity 3+    Comorbidities Cervical DDD with radiculitis s/p ACDF 2009, LBP with R sciatica, migraine, L lateral epicondylitis     Examination-Activity Limitations Bathing;Lift;Carry;Reach Overhead    Examination-Participation Restrictions Cleaning;Driving;Laundry;Occupation    PT Treatment/Interventions ADLs/Self Care Home Management;Cryotherapy;Electrical Stimulation;Iontophoresis 4mg /ml Dexamethasone;Moist Heat;Ultrasound;Therapeutic activities;Therapeutic exercise;Neuromuscular re-education;Patient/family education;Manual techniques;Passive  range of motion;Dry needling;Taping    PT Next Visit Plan postural stretching/strengthening & progress L shoulder AROM & strengthening, updating HEP as tolerated; manual therapy with DN as indicated to reduce muscle tension; modalities PRN    Consulted and Agree with Plan of Care Patient           Patient will benefit from skilled therapeutic intervention in order to improve the following deficits and impairments:  Decreased activity tolerance,Decreased endurance,Decreased range of motion,Decreased strength,Increased fascial restricitons,Increased muscle spasms,Impaired perceived functional ability,Impaired flexibility,Impaired sensation,Impaired UE functional use,Improper body mechanics,Postural dysfunction,Pain  Visit Diagnosis: Chronic left shoulder pain  Muscle weakness (generalized)  Abnormal posture  Other symptoms and signs involving the musculoskeletal system  Stiffness of left shoulder, not elsewhere classified     Problem List Patient Active Problem List   Diagnosis Date Noted  . Bursal cyst of olecranon 10/11/2020  . Family history of diabetes mellitus in father 09/10/2020  . Subacromial bursitis of left shoulder joint 06/13/2020  . Patellofemoral pain syndrome of left knee 03/23/2020  . Cervical disc disorder with radiculopathy of cervical region 10/12/2018  . Hyperlipidemia 09/07/2018  . Hip injury, right, initial encounter 07/03/2018  . Right hip pain 05/07/2018  . Sleep difficulties 06/06/2017  . Constipation 06/06/2017  . Insomnia 04/03/2017  .  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 Low back pain 02/13/2017  . Right sided sciatica 02/13/2017  . RLQ abdominal pain 08/20/2016  . Anal itching 08/20/2016  . GERD (gastroesophageal reflux disease) 06/05/2016  . Migraine 12/27/2015  . Headache 12/27/2015  . Lateral epicondylitis of left elbow 06/27/2015  . Cervical radiculitis 06/27/2015  . Non-allergic vasomotor rhinitis 06/01/2012    Artist Pais, PTA 11/30/2020, 5:24 PM  [facility] [address]  [place], [place], [address] Phone: [phone]   Fax:  [phone]  Name: Mindy [name] MRN: [id number] Date of Birth: [birth year]

## 2020-12-05 ENCOUNTER — Other Ambulatory Visit: Payer: Self-pay

## 2020-12-05 ENCOUNTER — Ambulatory Visit: Payer: 59

## 2020-12-05 DIAGNOSIS — M6281 Muscle weakness (generalized): Secondary | ICD-10-CM

## 2020-12-05 DIAGNOSIS — G8929 Other chronic pain: Secondary | ICD-10-CM

## 2020-12-05 DIAGNOSIS — M25512 Pain in left shoulder: Secondary | ICD-10-CM | POA: Diagnosis not present

## 2020-12-05 DIAGNOSIS — M25612 Stiffness of left shoulder, not elsewhere classified: Secondary | ICD-10-CM

## 2020-12-05 DIAGNOSIS — R293 Abnormal posture: Secondary | ICD-10-CM | POA: Diagnosis not present

## 2020-12-05 DIAGNOSIS — R29898 Other symptoms and signs involving the musculoskeletal system: Secondary | ICD-10-CM | POA: Diagnosis not present

## 2020-12-05 NOTE — Therapy (Signed)
[facility] [place] [address]  [address] [place], [place], [address] Phone: [phone]   Fax:  [phone]  Physical Therapy Treatment  Patient Details  Name: Mindy [name] MRN: [id number] Date of Birth: 07/14/1961 Referring Provider (PT): Jeremy E Schmitz, MD   Encounter Date: 12/05/2020   PT End of Session - 12/05/20 1703    Visit Number 10    Number of Visits 12    Date for PT Re-Evaluation 12/11/20    Authorization Type [facility]    PT Start Time 1700    PT Stop Time 1740    PT Time Calculation (min) 40 min    Activity Tolerance Patient tolerated treatment well    Behavior During Therapy WFL for tasks assessed/performed           Past Medical History:  Diagnosis Date  . Allergic rhinitis   . Complication of anesthesia   . Elevated transaminase level 12.2014   ?etiology - present 09/2013 hosp for epigastric pain  . Family history of adverse reaction to anesthesia    father - PONV  . GERD (gastroesophageal reflux disease)   . History of bronchitis 10/2014  . Migraine   . PONV (postoperative nausea and vomiting)   . Seasonal allergies     Past Surgical History:  Procedure Laterality Date  . BREAST BIOPSY Right   . BREAST EXCISIONAL BIOPSY    . BREAST LUMPECTOMY WITH RADIOACTIVE SEED LOCALIZATION Right 10/08/2016   Procedure: RIGHT BREAST LUMPECTOMY WITH RADIOACTIVE SEED LOCALIZATION;  Surgeon: Douglas Blackman, MD;  Location: [facility];  Service: General;  Laterality: Right;  . CHOLECYSTECTOMY N/A 09/21/2015   Procedure: LAPAROSCOPIC CHOLECYSTECTOMY;  Surgeon: Douglas Blackman, MD;  Location: [facility];  Service: General;  Laterality: N/A;  . COLONOSCOPY  12/2013  . NECK SURGERY  2009  . PARTIAL HYSTERECTOMY  2007  . UPPER GI ENDOSCOPY  2016    There were no vitals filed for this visit.   Subjective Assessment - 12/05/20 1703    Subjective Pt reports she is in no pain today only has pain while doing cross body  movements, push/pull and carrying items. follow up with MD on 12/22/20    Limitations House hold activities    Diagnostic tests 08/29/20 - L shoulder MRI:  Mild to moderate appearing supraspinatus and infraspinatus tendinopathy without tear. Small volume of subacromial/subdeltoid fluid compatible with  bursitis.    Patient Stated Goals "to be able to get back to working out the way I want to w/o pain & be able to shower w/o shoulder hurting"    Pain Score 0-No pain                             [facility] Adult PT Treatment/Exercise - 12/05/20 1702      Shoulder Exercises: Standing   Diagonals Strengthening;10 reps;Left   Each Direction: D1 flex, D1 ext, D2 flex, D2 ext   Theraband Level (Shoulder Diagonals) Level 1 (Yellow)   cues for controlled motion, no pain reported only stretches at end range   Other Standing Exercises Standing Is Ys 5x  each direction3 sec holds    Other Standing Exercises standing banded forearm walks along wall emphasis on scap stabilization, yellow TB      Shoulder Exercises: ROM/Strengthening   UBE (Upper Arm Bike) L3 x6 min alt fwd and back each min      Manual Therapy   Manual Therapy   Soft tissue mobilization;Myofascial release;Scapular mobilization    Soft tissue mobilization rhomboids, UT, levator, ant deltoid    Scapular Mobilization L scapula MWM during L shoulder abduction in sidelying to facilitate proper scapulohumeral rhythm. Scapular mobs with DTM at medial scapular border                    PT Short Term Goals - 11/16/20 1621      PT SHORT TERM GOAL #1   Title Patient will be independent with initial HEP    Status Achieved   11/14/20            PT Long Term Goals - 11/16/20 1655      PT LONG TERM GOAL #1   Title Patient will be independent with ongoing/advanced HEP for self-management at home    Status Partially Met    Target Date 12/11/20      PT LONG TERM GOAL #2   Title Improve posture and alignment with patient  to demonstrate improved upright posture with posterior shoulder girdle engaged    Status Partially Met    Target Date 12/11/20      PT LONG TERM GOAL #3   Title Decrease pain in the L shoulder girdle by 50-75% allowing patient to use L UE for functional activities    Status Partially Met    Target Date 12/11/20      PT LONG TERM GOAL #4   Title Patient to improve L shoulder AROM to WFL/WNL without pain provocation    Status On-going    Target Date 12/11/20      PT LONG TERM GOAL #5   Title Patient will demonstrate improved L shoulder strength to >/= 4 to 4+/5 for functional UE use    Status On-going    Target Date 12/11/20      PT LONG TERM GOAL #6   Title Patient to report ability to perform ADLs, household, and work-related tasks without limitation due to L shoulder pain, LOM or weakness    Status On-going    Target Date 12/11/20      PT LONG TERM GOAL #7   Title Patient to return to working out w/o limitation due to L shoulder pain    Status On-going    Target Date 12/11/20                 Plan - 12/05/20 1704    Clinical Impression Statement Pt is making very nice progress toward goals. She tolerated all exercises nicely today, feels like she is makig good More pain observed when going more into shld horizontal adduction and flexion. Pt showed improvements in ability to complete ER against resistance pain free. TTP found along the L rhomboids, UT, triceps, and ant deltoid but responded nicely to manual therapy. Overall pain is alot better but continued pain exacerbation at home with more strenuous pushing/pulling activities like vaccuming at home. Reaching has gotten alot better and is less bothersome. Went for 2.5 mile run/walk over the weekend without any pain but has not attempted upper body workouts as pre injury but she plans on trying them again soon. Discused with patient possibly revieiwing the exercises that are on her programs  and notifying PT next visit of  exercises that tend to be difficult or bothersome so that she may be given some feedback or suggestions for modifications for success at home. Pt in agreement.    Personal Factors and Comorbidities Time since onset of injury/illness/exacerbation;Past/Current Experience;Profession;Comorbidity 3+  Comorbidities Cervical DDD with radiculitis s/p ACDF 2009, LBP with R sciatica, migraine, L lateral epicondylitis    Examination-Activity Limitations Bathing;Lift;Carry;Reach Overhead    Examination-Participation Restrictions Cleaning;Driving;Laundry;Occupation    Rehab Potential Good    PT Frequency 2x / week    PT Duration 6 weeks    PT Treatment/Interventions ADLs/Self Care Home Management;Cryotherapy;Electrical Stimulation;Iontophoresis 4mg/ml Dexamethasone;Moist Heat;Ultrasound;Therapeutic activities;Therapeutic exercise;Neuromuscular re-education;Patient/family education;Manual techniques;Passive range of motion;Dry needling;Taping    PT Next Visit Plan postural stretching/strengthening & progress L shoulder AROM & strengthening, updating HEP as tolerated; manual therapy with DN as indicated to reduce muscle tension; modalities PRN    Consulted and Agree with Plan of Care Patient           Patient will benefit from skilled therapeutic intervention in order to improve the following deficits and impairments:  Decreased activity tolerance,Decreased endurance,Decreased range of motion,Decreased strength,Increased fascial restricitons,Increased muscle spasms,Impaired perceived functional ability,Impaired flexibility,Impaired sensation,Impaired UE functional use,Improper body mechanics,Postural dysfunction,Pain  Visit Diagnosis: Chronic left shoulder pain  Muscle weakness (generalized)  Other symptoms and signs involving the musculoskeletal system  Stiffness of left shoulder, not elsewhere classified  Abnormal posture     Problem List Patient Active Problem List   Diagnosis Date Noted  .  Bursal cyst of olecranon 10/11/2020  . Family history of diabetes mellitus in father 09/10/2020  . Subacromial bursitis of left shoulder joint 06/13/2020  . Patellofemoral pain syndrome of left knee 03/23/2020  . Cervical disc disorder with radiculopathy of cervical region 10/12/2018  . Hyperlipidemia 09/07/2018  . Hip injury, right, initial encounter 07/03/2018  . Right hip pain 05/07/2018  . Sleep difficulties 06/06/2017  . Constipation 06/06/2017  . Insomnia 04/03/2017  . 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 Low back pain 02/13/2017  . Right sided sciatica 02/13/2017  . RLQ abdominal pain 08/20/2016  . Anal itching 08/20/2016  . GERD (gastroesophageal reflux disease) 06/05/2016  . Migraine 12/27/2015  . Headache 12/27/2015  . Lateral epicondylitis of left elbow 06/27/2015  . Cervical radiculitis 06/27/2015  . Non-allergic vasomotor rhinitis 06/01/2012    Monica L Benedicto, PT, DPT 12/05/2020, 5:51 PM  [facility] [place] [address]  [address] [place], [place], [address] Phone: [phone]   Fax:  [phone]  Name: Robynn A Castelo MRN: [id number] Date of Birth: 02/24/1961   

## 2020-12-07 ENCOUNTER — Ambulatory Visit: Payer: 59 | Admitting: Physical Therapy

## 2020-12-12 ENCOUNTER — Ambulatory Visit: Payer: 59 | Admitting: Physical Therapy

## 2020-12-12 ENCOUNTER — Other Ambulatory Visit: Payer: Self-pay

## 2020-12-12 ENCOUNTER — Encounter: Payer: Self-pay | Admitting: Physical Therapy

## 2020-12-12 DIAGNOSIS — G8929 Other chronic pain: Secondary | ICD-10-CM

## 2020-12-12 DIAGNOSIS — R29898 Other symptoms and signs involving the musculoskeletal system: Secondary | ICD-10-CM

## 2020-12-12 DIAGNOSIS — M25612 Stiffness of left shoulder, not elsewhere classified: Secondary | ICD-10-CM | POA: Diagnosis not present

## 2020-12-12 DIAGNOSIS — M6281 Muscle weakness (generalized): Secondary | ICD-10-CM | POA: Diagnosis not present

## 2020-12-12 DIAGNOSIS — R293 Abnormal posture: Secondary | ICD-10-CM

## 2020-12-12 DIAGNOSIS — M25512 Pain in left shoulder: Secondary | ICD-10-CM | POA: Diagnosis not present

## 2020-12-12 NOTE — Therapy (Signed)
[facility] [place] [address]  [place], [place], [address] Phone: [phone]   Fax:  [phone]  Physical Therapy Treatment  Patient Details  Name: Mindy [name] MRN: [id number] Date of Birth: 1961-10-03 Referring Provider (PT): Rosemarie Ax, MD   Encounter Date: 12/12/2020   PT End of Session - 12/12/20 1708    Visit Number 11    Number of Visits 12    Date for PT Re-Evaluation 12/11/20    Authorization Type [facility]    PT Start Time 1708    PT Stop Time 1747    PT Time Calculation (min) 39 min    Activity Tolerance Patient tolerated treatment well    Behavior During Therapy [facility] for tasks assessed/performed           Past Medical History:  Diagnosis Date  . Allergic rhinitis   . Complication of anesthesia   . Elevated transaminase level 12.2014   ?etiology - present 09/2013 hosp for epigastric pain  . Family history of adverse reaction to anesthesia    father - PONV  . GERD (gastroesophageal reflux disease)   . History of bronchitis 10/2014  . Migraine   . PONV (postoperative nausea and vomiting)   . Seasonal allergies     Past Surgical History:  Procedure Laterality Date  . BREAST BIOPSY Right   . BREAST EXCISIONAL BIOPSY    . BREAST LUMPECTOMY WITH RADIOACTIVE SEED LOCALIZATION Right 10/08/2016   Procedure: RIGHT BREAST LUMPECTOMY WITH RADIOACTIVE SEED LOCALIZATION;  Surgeon: Coralie Keens, MD;  Location: [facility];  Service: General;  Laterality: Right;  . CHOLECYSTECTOMY N/A 09/21/2015   Procedure: LAPAROSCOPIC CHOLECYSTECTOMY;  Surgeon: Coralie Keens, MD;  Location: [facility];  Service: General;  Laterality: N/A;  . COLONOSCOPY  12/2013  . NECK SURGERY  2009  . PARTIAL HYSTERECTOMY  2007  . UPPER GI ENDOSCOPY  2016    There were no vitals filed for this visit.   Subjective Assessment - 12/12/20 1715    Subjective Pt reports pain not as frequent with improved ability to complete motions that  used to be painful. She still notes the greatest pain with reaching across her body or out to the side.    Limitations House hold activities    Diagnostic tests 08/29/20 - L shoulder MRI:  Mild to moderate appearing supraspinatus and infraspinatus tendinopathy without tear. Small volume of subacromial/subdeltoid fluid compatible with  bursitis.    Patient Stated Goals "to be able to get back to working out the way I want to w/o pain & be able to shower w/o shoulder hurting"    Currently in Pain? No/denies    Pain Score 0-No pain   up to 3-4/10 fleeting pain with certain   Pain Location Shoulder    Pain Orientation Left;Anterior    Pain Descriptors / Indicators Sore;Tightness   tightness in posterior arm   Pain Type Acute pain    Pain Frequency Intermittent                             [facility] Adult PT Treatment/Exercise - 12/12/20 1708      Exercises   Exercises Shoulder;Elbow      Elbow Exercises   Elbow Flexion Both;10 reps;Strengthening;Theraband;Standing    Theraband Level (Elbow Flexion) Level 1 (Yellow)    Elbow Flexion Limitations cues for scapular engagement to stablize shoulders   standing on band for anchor  Elbow Extension Both;10 reps;Strengthening;Theraband;Standing    Theraband Level (Elbow Extension) Level 1 (Yellow)    Elbow Extension Limitations cues for scapular engagement to stablize shoulders   band anchored over top of door     Shoulder Exercises: Sidelying   External Rotation Left;10 reps;AROM;Strengthening;Weights   2 sets   External Rotation Weight (lbs) 1   1st set with no weight, 2nd set with 1#   External Rotation Limitations cues for scap retraction; towel roll under elbow    ABduction Left;10 reps;AROM;Strengthening    ABduction Weight (lbs) 1   1st set with no weight, 2nd set with 1#   ABduction Limitations slight scaption - 1st set: full AROM w/o weight, 2nd set: short range ~20-100 initially with 1# wt, gradually increasing ROM as tol       Shoulder Exercises: Standing   Row Both;15 reps;Strengthening;Theraband    Theraband Level (Shoulder Row) Level 2 (Red)    Row Limitations cues for scap retraction tucking elbows into ribs, avoiding excessive shoulder extension or upward rotation of elbows    Retraction Both;12 reps;Strengthening;Theraband    Theraband Level (Shoulder Retraction) Level 2 (Red)    Retraction Limitations emphasis on scap retraction & depression with shoulder extension to neutral      Shoulder Exercises: ROM/Strengthening   UBE (Upper Arm Bike) L3.0 x 4 min (2 fwd/2 back)    Nustep L5 x 2 min (UE/LE)      Manual Therapy   Manual Therapy Soft tissue mobilization;Myofascial release    Soft tissue mobilization L teres group & triceps    Myofascial Release manual TPR, pin & stretch to L triceps - better tolerance for shoulder ABD following MT    Scapular Mobilization L scapula MWM during L shoulder abduction in sidelying to facilitate proper scapulohumeral rhythm - better rhythm today with minimal guidance needed from PT. Scapular mobs with DTM at lateral scapular border/teres group.                    PT Short Term Goals - 11/16/20 1621      PT SHORT TERM GOAL #1   Title Patient will be independent with initial HEP    Status Achieved   11/14/20            PT Long Term Goals - 11/16/20 1655      PT LONG TERM GOAL #1   Title Patient will be independent with ongoing/advanced HEP for self-management at home    Status Partially Met    Target Date 12/11/20      PT LONG TERM GOAL #2   Title Improve posture and alignment with patient to demonstrate improved upright posture with posterior shoulder girdle engaged    Status Partially Met    Target Date 12/11/20      PT LONG TERM GOAL #3   Title Decrease pain in the L shoulder girdle by 50-75% allowing patient to use L UE for functional activities    Status Partially Met    Target Date 12/11/20      PT LONG TERM GOAL #4   Title Patient  to improve L shoulder AROM to WFL/WNL without pain provocation    Status On-going    Target Date 12/11/20      PT LONG TERM GOAL #5   Title Patient will demonstrate improved L shoulder strength to >/= 4 to 4+/5 for functional UE use    Status On-going    Target Date 12/11/20  PT LONG TERM GOAL #6   Title Patient to report ability to perform ADLs, household, and work-related tasks without limitation due to L shoulder pain, LOM or weakness    Status On-going    Target Date 12/11/20      PT LONG TERM GOAL #7   Title Patient to return to working out w/o limitation due to L shoulder pain    Status On-going    Target Date 12/11/20                 Plan - 12/12/20 1719    Clinical Impression Statement Kierria reports decreased frequency of pain with improved functional use of L shoulder but will still experience brief fleeting pain with motions reaching across body or out to side especially with ER as well as some tightness in posterior arm with full range abduction ROM. Taut band noted in L triceps which was addressed with manual STM/TPR resulting in improvement in sensation of tightness. HEP reviewed providing clarification of desired movement patterns and addressing pt's concerns regarding progression of resistance for further strengthening. One visit remaining in current POC - anticipate pt will likely be ready to transition to her HEP with probable 30-day hold pending final goal assessment and further review of HEP as indicated/requested by pt.    Personal Factors and Comorbidities Time since onset of injury/illness/exacerbation;Past/Current Experience;Profession;Comorbidity 3+    Comorbidities Cervical DDD with radiculitis s/p ACDF 2009, LBP with R sciatica, migraine, L lateral epicondylitis    Examination-Activity Limitations Bathing;Lift;Carry;Reach Overhead    Examination-Participation Restrictions Cleaning;Driving;Laundry;Occupation    Rehab Potential Good    PT Frequency 2x /  week    PT Duration 6 weeks    PT Treatment/Interventions ADLs/Self Care Home Management;Cryotherapy;Electrical Stimulation;Iontophoresis 64m/ml Dexamethasone;Moist Heat;Ultrasound;Therapeutic activities;Therapeutic exercise;Neuromuscular re-education;Patient/family education;Manual techniques;Passive range of motion;Dry needling;Taping    PT Next Visit Plan goal assessment & FOTO - anticipate transition to HEP + 30-day hold but may consider recert if indicated; potentially add diagonals/cross-body motions to HEP    Consulted and Agree with Plan of Care Patient           Patient will benefit from skilled therapeutic intervention in order to improve the following deficits and impairments:  Decreased activity tolerance,Decreased endurance,Decreased range of motion,Decreased strength,Increased fascial restricitons,Increased muscle spasms,Impaired perceived functional ability,Impaired flexibility,Impaired sensation,Impaired UE functional use,Improper body mechanics,Postural dysfunction,Pain  Visit Diagnosis: Chronic left shoulder pain  Muscle weakness (generalized)  Other symptoms and signs involving the musculoskeletal system  Stiffness of left shoulder, not elsewhere classified  Abnormal posture     Problem List Patient Active Problem List   Diagnosis Date Noted  . Bursal cyst of olecranon 10/11/2020  . Family history of diabetes mellitus in father 09/10/2020  . Subacromial bursitis of left shoulder joint 06/13/2020  . Patellofemoral pain syndrome of left knee 03/23/2020  . Cervical disc disorder with radiculopathy of cervical region 10/12/2018  . Hyperlipidemia 09/07/2018  . Hip injury, right, initial encounter 07/03/2018  . Right hip pain 05/07/2018  . Sleep difficulties 06/06/2017  . Constipation 06/06/2017  . Insomnia 04/03/2017  . 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 Low back pain 02/13/2017  . Right sided sciatica 02/13/2017  .  RLQ abdominal pain 08/20/2016  . Anal itching 08/20/2016  . GERD (gastroesophageal reflux disease) 06/05/2016  . Migraine 12/27/2015  . Headache 12/27/2015  . Lateral epicondylitis of left elbow 06/27/2015  . Cervical radiculitis 06/27/2015  . Non-allergic vasomotor rhinitis 06/01/2012    JGeorgian Co  Luis Abed, PT, MPT 12/12/2020, 7:45 PM  [facility] [address]  [place], [place], [address] Phone: [phone]   Fax:  [phone]  Name: Mindy [name] MRN: [id number] Date of Birth: 11/08/1960

## 2020-12-14 ENCOUNTER — Other Ambulatory Visit: Payer: Self-pay

## 2020-12-14 ENCOUNTER — Ambulatory Visit: Payer: 59 | Admitting: Physical Therapy

## 2020-12-14 ENCOUNTER — Encounter: Payer: Self-pay | Admitting: Physical Therapy

## 2020-12-14 DIAGNOSIS — R29898 Other symptoms and signs involving the musculoskeletal system: Secondary | ICD-10-CM

## 2020-12-14 DIAGNOSIS — G8929 Other chronic pain: Secondary | ICD-10-CM | POA: Diagnosis not present

## 2020-12-14 DIAGNOSIS — M6281 Muscle weakness (generalized): Secondary | ICD-10-CM

## 2020-12-14 DIAGNOSIS — M25612 Stiffness of left shoulder, not elsewhere classified: Secondary | ICD-10-CM

## 2020-12-14 DIAGNOSIS — M25512 Pain in left shoulder: Secondary | ICD-10-CM

## 2020-12-14 DIAGNOSIS — R293 Abnormal posture: Secondary | ICD-10-CM

## 2020-12-14 NOTE — Patient Instructions (Signed)
    Access Code: [email or web address] URL: [email or web address] Date: 12/14/2020 Prepared by: Annie Paras  Exercises - Yellow TB Standing Single Arm Shoulder PNF D1 Flexion - 1 x daily - 7 x weekly - 2 sets - 10 reps - 3 sec hold Shoulder PNF D2 Flexion - 1 x daily - 7 x weekly - 2 sets - 10 reps - 3 sec hold Standing Single Arm Shoulder PNF D1 Extension - 1 x daily - 7 x weekly - 2 sets - 10 reps - 3 sec hold Shoulder PNF D2 Extension - 1 x daily - 7 x weekly - 2 sets - 10 reps - 3 sec hold

## 2020-12-14 NOTE — Therapy (Addendum)
[facility] [address]  [address] [place], [place], [address] Phone: [phone]   Fax:  [phone]  Physical Therapy Treatment / Progress Note / Discharge Summary  Patient Details  Name: Mindy [name] MRN: [id number] Date of Birth: [birth year] Referring Provider (PT): Myra Rude, MD   Encounter Date: 12/14/2020   PT End of Session - 12/14/20 1655    Visit Number 12    Number of Visits 12    Date for PT Re-Evaluation 12/11/20    Authorization Type [facility]    PT Start Time 1655    PT Stop Time 1734    PT Time Calculation (min) 39 min    Activity Tolerance Patient tolerated treatment well    Behavior During Therapy [facility] for tasks assessed/performed           Past Medical History:  Diagnosis Date  . Allergic rhinitis   . Complication of anesthesia   . Elevated transaminase level 12.2014   ?etiology - present 09/2013 hosp for epigastric pain  . Family history of adverse reaction to anesthesia    father - PONV  . GERD (gastroesophageal reflux disease)   . History of bronchitis 10/2014  . Migraine   . PONV (postoperative nausea and vomiting)   . Seasonal allergies     Past Surgical History:  Procedure Laterality Date  . BREAST BIOPSY Right   . BREAST EXCISIONAL BIOPSY    . BREAST LUMPECTOMY WITH RADIOACTIVE SEED LOCALIZATION Right 10/08/2016   Procedure: RIGHT BREAST LUMPECTOMY WITH RADIOACTIVE SEED LOCALIZATION;  Surgeon: Abigail Miyamoto, MD;  Location: [facility] OR;  Service: General;  Laterality: Right;  . CHOLECYSTECTOMY N/A 09/21/2015   Procedure: LAPAROSCOPIC CHOLECYSTECTOMY;  Surgeon: Abigail Miyamoto, MD;  Location: [facility] OR;  Service: General;  Laterality: N/A;  . COLONOSCOPY  12/2013  . NECK SURGERY  2009  . PARTIAL HYSTERECTOMY  2007  . UPPER GI ENDOSCOPY  2016    There were no vitals filed for this visit.   Subjective Assessment - 12/14/20 1657    Subjective Pt reporting no issues following HEP  review/update last visit and feels that she will be good with transitioning to the HEP at this point..    Diagnostic tests 08/29/20 - L shoulder MRI:  Mild to moderate appearing supraspinatus and infraspinatus tendinopathy without tear. Small volume of subacromial/subdeltoid fluid compatible with  bursitis.    Patient Stated Goals "to be able to get back to working out the way I want to w/o pain & be able to shower w/o shoulder hurting"    Currently in Pain? No/denies    Pain Score 0-No pain   up to ~3/10 with certain motions across body or out to side   Pain Location Shoulder    Pain Orientation Left               [facility] PT Assessment - 12/14/20 1655      Assessment   Medical Diagnosis L subacromial bursitis    Referring Provider (PT) Myra Rude, MD    Onset Date/Surgical Date 06/02/20    Hand Dominance Left    Next MD Visit 12/22/20      Observation/Other Assessments   Focus on Therapeutic Outcomes (FOTO)  Shoulder - FS = 67 (16 point improvement)      AROM   Left Shoulder Flexion 158 Degrees    Left Shoulder ABduction 145 Degrees    Left Shoulder Internal Rotation 81 Degrees   FIR to T10  Left Shoulder External Rotation 94 Degrees   FER to T3     Strength   Right Shoulder Flexion 5/5    Right Shoulder ABduction 5/5    Right Shoulder Internal Rotation 5/5    Right Shoulder External Rotation 5/5    Left Shoulder Flexion 4+/5    Left Shoulder ABduction 4/5   slight pain on MMT   Left Shoulder Internal Rotation 4+/5    Left Shoulder External Rotation 4+/5                         [facility] Adult PT Treatment/Exercise - 12/14/20 1655      Shoulder Exercises: ROM/Strengthening   UBE (Upper Arm Bike) L3.5 x 6 min (3' fwd/3' back)                  PT Education - 12/14/20 1731    Education Details HEP update - yellow TB diagonals - Access Code: [place]; green TB provided for self-progression of exercises at home (pt already has yellow & red TB)     Person(s) Educated Patient    Methods Explanation;Demonstration;Handout    Comprehension Verbalized understanding;Returned demonstration            PT Short Term Goals - 11/16/20 1621      PT SHORT TERM GOAL #1   Title Patient will be independent with initial HEP    Status Achieved   11/14/20            PT Long Term Goals - 12/14/20 1700      PT LONG TERM GOAL #1   Title Patient will be independent with ongoing/advanced HEP for self-management at home    Status Achieved   12/14/20     PT LONG TERM GOAL #2   Title Improve posture and alignment with patient to demonstrate improved upright posture with posterior shoulder girdle engaged    Status Achieved   12/14/20     PT LONG TERM GOAL #3   Title Decrease pain in the L shoulder girdle by 50-75% allowing patient to use L UE for functional activities    Status Achieved   12/14/20 - Pt reporting "at least 75%" improvement in pain     PT LONG TERM GOAL #4   Title Patient to improve L shoulder AROM to WFL/WNL without pain provocation    Status Achieved   12/14/20     PT LONG TERM GOAL #5   Title Patient will demonstrate improved L shoulder strength to >/= 4 to 4+/5 for functional UE use    Status Achieved   12/14/20     PT LONG TERM GOAL #6   Title Patient to report ability to perform ADLs, household, and work-related tasks without limitation due to L shoulder pain, LOM or weakness    Status Partially Met   12/14/20 - able to complete all activities, but things such as vacuuming still causes pain     PT LONG TERM GOAL #7   Title Patient to return to working out w/o limitation due to L shoulder pain    Status Achieved   12/14/20                Plan - 12/14/20 1702    Clinical Impression Statement Ilse reporting at least 75% reduction in pain since starting PT. She is now much more consistent with neutral shoulder posture and good scapular engagement. L shoulder AROM now WFL/WNL with only mild limitation noted in  FIR behind  back. L shoulder strength now grossly 4 to 4+/5 with only slight pain noted on abduction MMT resistance. She reports she is able to complete all her normal daily tasks but does still note some discomfort with cross-body reaching or reaching out to the side and also reports some increased pain with attempts at vacuuming. She feels comfortable with the HEP and feels that she can resume her workouts as long as she keeps the weight light. All goals met with exception of LTG #6 only partially met due to above noted limitations. Meghanne feels ready to transition to her HEP but will remain on hold for 30 days in the event that issues arise necessitating a return to PT.    Personal Factors and Comorbidities --    Comorbidities Cervical DDD with radiculitis s/p ACDF 2009, LBP with R sciatica, migraine, L lateral epicondylitis    Examination-Activity Limitations --    Examination-Participation Restrictions Cleaning    Rehab Potential Good    PT Frequency --    PT Duration --    PT Treatment/Interventions ADLs/Self Care Home Management;Cryotherapy;Electrical Stimulation;Iontophoresis 4mg /ml Dexamethasone;Moist Heat;Ultrasound;Therapeutic activities;Therapeutic exercise;Neuromuscular re-education;Patient/family education;Manual techniques;Passive range of motion;Dry needling;Taping    PT Next Visit Plan transition to HEP + 30-day hold    Consulted and Agree with Plan of Care Patient           Patient will benefit from skilled therapeutic intervention in order to improve the following deficits and impairments:  Decreased activity tolerance,Decreased endurance,Decreased range of motion,Decreased strength,Increased fascial restricitons,Increased muscle spasms,Impaired perceived functional ability,Impaired flexibility,Impaired sensation,Impaired UE functional use,Improper body mechanics,Postural dysfunction,Pain  Visit Diagnosis: Chronic left shoulder pain  Muscle weakness (generalized)  Other symptoms and  signs involving the musculoskeletal system  Stiffness of left shoulder, not elsewhere classified  Abnormal posture     Problem List Patient Active Problem List   Diagnosis Date Noted  . Bursal cyst of olecranon 10/11/2020  . Family history of diabetes mellitus in father 09/10/2020  . Subacromial bursitis of left shoulder joint 06/13/2020  . Patellofemoral pain syndrome of left knee 03/23/2020  . Cervical disc disorder with radiculopathy of cervical region 10/12/2018  . Hyperlipidemia 09/07/2018  . Hip injury, right, initial encounter 07/03/2018  . Right hip pain 05/07/2018  . Sleep difficulties 06/06/2017  . Constipation 06/06/2017  . Insomnia 04/03/2017  . 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 Low back pain 02/13/2017  . Right sided sciatica 02/13/2017  . RLQ abdominal pain 08/20/2016  . Anal itching 08/20/2016  . GERD (gastroesophageal reflux disease) 06/05/2016  . Migraine 12/27/2015  . Headache 12/27/2015  . Lateral epicondylitis of left elbow 06/27/2015  . Cervical radiculitis 06/27/2015  . Non-allergic vasomotor rhinitis 06/01/2012    Percival Spanish, PT, MPT 12/14/2020, 5:53 PM  [facility] [address]  [place], [place], [address] Phone: [phone]   Fax:  [phone]  Name: LAIYA WISBY MRN: [id number] Date of Birth: 06/12/1961  PHYSICAL THERAPY DISCHARGE SUMMARY  Visits from Start of Care: 12  Current functional level related to goals / functional outcomes:   Refer to above clinical impression for status as of last visit on 12/14/2020. Patient was placed on hold for 30 days and has not needed to return to PT, therefore will proceed with discharge from PT for this episode.   Remaining deficits:   As above.    Education / Equipment:   HEP  Plan: Patient agrees  to discharge.  Patient goals were mostly met. Patient is being discharged due to being  pleased with the current functional level.  ?????     Percival Spanish, PT, MPT 02/20/21, 11:41 AM  [facility] [address]  [place], [place], [address] Phone: [phone]   Fax:  [phone]

## 2020-12-16 ENCOUNTER — Other Ambulatory Visit (HOSPITAL_BASED_OUTPATIENT_CLINIC_OR_DEPARTMENT_OTHER): Payer: Self-pay | Admitting: Internal Medicine

## 2020-12-16 DIAGNOSIS — Z1231 Encounter for screening mammogram for malignant neoplasm of breast: Secondary | ICD-10-CM

## 2020-12-18 MED FILL — LINZESS 72 MCG CAPSULE: 72 | 90 days supply | Qty: 90 | Fill #0

## 2020-12-19 ENCOUNTER — Ambulatory Visit (HOSPITAL_BASED_OUTPATIENT_CLINIC_OR_DEPARTMENT_OTHER)
Admission: RE | Admit: 2020-12-19 | Discharge: 2020-12-19 | Disposition: A | Discharge status: Home / Self Care | Payer: 59 | Source: Ambulatory Visit | Attending: Internal Medicine | Admitting: Internal Medicine

## 2020-12-19 ENCOUNTER — Encounter (HOSPITAL_BASED_OUTPATIENT_CLINIC_OR_DEPARTMENT_OTHER): Payer: Self-pay

## 2020-12-19 ENCOUNTER — Other Ambulatory Visit: Payer: Self-pay

## 2020-12-19 DIAGNOSIS — Z1231 Encounter for screening mammogram for malignant neoplasm of breast: Secondary | ICD-10-CM | POA: Diagnosis not present

## 2020-12-19 LAB — HM MAMMOGRAPHY

## 2020-12-19 NOTE — Progress Notes (Signed)
Subjective:    Patient ID: Mindy [name], female    DOB: 04-10-1961, 60 y.o.   MRN: [id number]  HPI The patient is here for an acute visit.   Her migraines are more frequent - she has a migraine today. It is intractable - nothing she has tried has helped.    Bad HA since thursday- started in the back of her head and by Sunday intermittent sharp stabbing pain in right front of head.  Back of head still pressure.  Nothing she tried has helped.  She has some nausea.  She is not sure why her headaches are worse.  The relpax is not working as well.  She has never been on preventative meds.  Increase in migraines recently -- ? Weather chagne.  Mild congestion last week, but none now.        Medications and allergies reviewed with patient and updated if appropriate.  Patient Active Problem List   Diagnosis Date Noted  . Bursal cyst of olecranon 10/11/2020  . Family history of diabetes mellitus in father 09/10/2020  . Subacromial bursitis of left shoulder joint 06/13/2020  . Patellofemoral pain syndrome of left knee 03/23/2020  . Cervical disc disorder with radiculopathy of cervical region 10/12/2018  . Hyperlipidemia 09/07/2018  . Hip injury, right, initial encounter 07/03/2018  . Right hip pain 05/07/2018  . Sleep difficulties 06/06/2017  . Constipation 06/06/2017  . Insomnia 04/03/2017  . 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 Low back pain 02/13/2017  . Right sided sciatica 02/13/2017  . RLQ abdominal pain 08/20/2016  . Anal itching 08/20/2016  . GERD (gastroesophageal reflux disease) 06/05/2016  . Migraine 12/27/2015  . Headache 12/27/2015  . Lateral epicondylitis of left elbow 06/27/2015  . Cervical radiculitis 06/27/2015  . 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 albuterol (VENTOLIN HFA) 108 (90 Base) MCG/ACT inhaler Inhale 2 puffs  into the lungs every 4 (four) hours as needed for wheezing or shortness of breath. 18 g 0  . butalbital-acetaminophen-caffeine (FIORICET) 50-325-40 MG tablet TAKE 1 TABLET BY MOUTH EVERY 6 HOURS AS NEEDED FOR HEADACHE 75 tablet 5  . Dexlansoprazole (DEXILANT) 30 MG capsule Take 1 capsule (30 mg total) by mouth daily. 90 capsule 3  . eletriptan (RELPAX) 40 MG tablet May repeat in 2 hours if headache persists or recurs. 10 tablet 8  . fluticasone (FLONASE) 50 MCG/ACT nasal spray Place 2 sprays into both nostrils daily. 16 g 6  . linaclotide (LINZESS) 72 MCG capsule Take 1 capsule (72 mcg total) by mouth daily before breakfast. 90 capsule 1  . naproxen sodium (ALEVE) 220 MG tablet Take 220 mg by mouth 2 (two) times daily as needed.    . Pramoxine-HC (HYDROCORTISONE ACE-PRAMOXINE) 2.5-1 % CREA Apply TID-QID 57 g 2  . PREMARIN 0.9 MG tablet Take 0.9 mg by mouth daily.    . pseudoephedrine (SUDAFED) 30 MG tablet Per box as needed    . sodium chloride (OCEAN) 0.65 % SOLN nasal spray Place 1 spray into both nostrils as needed for congestion. 50 mL 0  . triamcinolone (KENALOG) 0.1 % Apply topically 2 (two) times daily.    Marland Kitchen azelastine (ASTELIN) 0.1 % nasal spray Place 1-2 sprays into both nostrils 2 (two) times daily. Use in each nostril as directed 30 mL prn   No current facility-administered medications on file prior to visit.  Past Medical History:  Diagnosis Date  . Allergic rhinitis   . Complication of anesthesia   . Elevated transaminase level 12.2014   ?etiology - present 09/2013 hosp for epigastric pain  . Family history of adverse reaction to anesthesia    father - PONV  . GERD (gastroesophageal reflux disease)   . History of bronchitis 10/2014  . Migraine   . PONV (postoperative nausea and vomiting)   . Seasonal allergies     Past Surgical History:  Procedure Laterality Date  . BREAST BIOPSY Right   . BREAST EXCISIONAL BIOPSY Right 2017  . BREAST LUMPECTOMY WITH RADIOACTIVE  SEED LOCALIZATION Right 10/08/2016   Procedure: RIGHT BREAST LUMPECTOMY WITH RADIOACTIVE SEED LOCALIZATION;  Surgeon: Coralie Keens, MD;  Location: [facility];  Service: General;  Laterality: Right;  . CHOLECYSTECTOMY N/A 09/21/2015   Procedure: LAPAROSCOPIC CHOLECYSTECTOMY;  Surgeon: Coralie Keens, MD;  Location: [facility];  Service: General;  Laterality: N/A;  . COLONOSCOPY  12/2013  . NECK SURGERY  2009  . PARTIAL HYSTERECTOMY  2007  . UPPER GI ENDOSCOPY  2016    Social History   Socioeconomic History  . Marital status: Divorced    Spouse name: Not on file  . Number of children: Not on file  . Years of education: Not on file  . Highest education level: Not on file  Occupational History  . Occupation: Therapist, sports  Tobacco Use  . Smoking status: Never Smoker  . Smokeless tobacco: Never Used  Vaping Use  . Vaping Use: Never used  Substance and Sexual Activity  . Alcohol use: Yes    Comment: occ  . Drug use: No  . Sexual activity: Not on file  Other Topics Concern  . Not on file  Social History Narrative   Exercise: on average 3/week - running, weights sometimes   Social Determinants of Health   Financial Resource Strain: Not on file  Food Insecurity: Not on file  Transportation Needs: Not on file  Physical Activity: Not on file  Stress: Not on file  Social Connections: Not on file    Family History  Problem Relation Age of Onset  . Atrial fibrillation Father   . Diabetes type II Father   . Hypertension Father   . Coronary artery disease Father 35       stent x 2, AoVR  . Hypertension Mother   . Dementia Mother   . Hypertension Brother   . Breast cancer Maternal Aunt   . Breast cancer Maternal Grandmother   . Breast cancer Paternal Grandmother     Review of Systems  Constitutional: Negative for fever.  HENT: Negative for congestion and sinus pressure.   Eyes: Negative for visual disturbance.  Gastrointestinal: Positive for nausea.  Neurological: Positive for  headaches. Negative for dizziness, weakness, light-headedness and numbness.       Objective:   Vitals:   12/20/20 1506  BP: 120/72  Pulse: 72  Temp: 98.2 F (36.8 C)  SpO2: 97%   BP Readings from Last 3 Encounters:  12/20/20 120/72  10/11/20 122/82  09/11/20 122/78   Wt Readings from Last 3 Encounters:  12/20/20 126 lb (57.2 kg)  10/11/20 125 lb (56.7 kg)  09/11/20 125 lb 6.4 oz (56.9 kg)   Body mass index is 615.19 kg/m.   Physical Exam Constitutional:      General: She is not in acute distress.    Appearance: Normal appearance. She is not ill-appearing.     Comments: Uncomfortable secondary to migraine  HENT:     Head: Normocephalic and atraumatic.  Skin:    General: Skin is warm and dry.  Neurological:     General: No focal deficit present.     Mental Status: She is alert and oriented to person, place, and time.  Psychiatric:        Mood and Affect: Mood normal.            Assessment & Plan:    See Problem List for Assessment and Plan of chronic medical problems.    This visit occurred during the SARS-CoV-2 public health emergency.  Safety protocols were in place, including screening questions prior to the visit, additional usage of staff PPE, and extensive cleaning of exam room while observing appropriate contact time as indicated for disinfecting solutions.

## 2020-12-20 ENCOUNTER — Encounter: Payer: Self-pay | Admitting: Internal Medicine

## 2020-12-20 ENCOUNTER — Other Ambulatory Visit: Payer: Self-pay | Admitting: Internal Medicine

## 2020-12-20 ENCOUNTER — Other Ambulatory Visit: Payer: Self-pay

## 2020-12-20 ENCOUNTER — Ambulatory Visit: Payer: 59 | Admitting: Internal Medicine

## 2020-12-20 VITALS — BP 120/72 | HR 72 | Temp 98.2°F | Ht <= 58 in | Wt 126.0 lb

## 2020-12-20 DIAGNOSIS — G43709 Chronic migraine without aura, not intractable, without status migrainosus: Secondary | ICD-10-CM

## 2020-12-20 DIAGNOSIS — G43911 Migraine, unspecified, intractable, with status migrainosus: Secondary | ICD-10-CM | POA: Diagnosis not present

## 2020-12-20 MED ORDER — KETOROLAC TROMETHAMINE 60 MG/2ML IM SOLN
60.0000 mg | Freq: Once | INTRAMUSCULAR | Status: AC
Start: 2020-12-20 — End: 2020-12-20
  Administered 2020-12-20: 60 mg via INTRAMUSCULAR

## 2020-12-20 MED ORDER — METHYLPREDNISOLONE ACETATE 80 MG/ML IJ SUSP
80.0000 mg | Freq: Once | INTRAMUSCULAR | Status: AC
Start: 2020-12-20 — End: 2020-12-20
  Administered 2020-12-20: 80 mg via INTRAMUSCULAR

## 2020-12-20 MED ORDER — ONDANSETRON 4 MG PO TBDP: 4.0000 mg | ORAL_TABLET | Freq: Three times a day (TID) | ORAL | 0 refills | Status: DC | PRN

## 2020-12-20 MED ORDER — NURTEC 75 MG PO TBDP: 75.0000 mg | ORAL_TABLET | Freq: Once | ORAL | 0 refills | Status: DC | PRN

## 2020-12-20 MED ORDER — EMGALITY 120 MG/ML ~~LOC~~ SOSY: 120.0000 mg | PREFILLED_SYRINGE | SUBCUTANEOUS | 5 refills | Status: DC

## 2020-12-20 MED FILL — ONDANSETRON ODT 4 MG TABLET: 4 | 6 days supply | Qty: 20 | Fill #0

## 2020-12-20 NOTE — Patient Instructions (Addendum)
Received a steroid and anti-inflammatory injection today.   For prevention -   Emgality 120 mg injection once a month.     For migraines when you have them - nurtec samples - take as soon as possible when you start to have a migraine.  Take one pill once only.  Take the zofran if needed for nausea.      Rimegepant oral dissolving tablet What is this medicine? RIMEGEPANT (ri ME je pant) is used to treat migraine headaches with or without aura. An aura is a strange feeling or visual disturbance that warns you of an attack. It is also used to prevent migraine headaches. This medicine may be used for other purposes; ask your health care provider or pharmacist if you have questions. COMMON BRAND NAME(S): NURTEC ODT What should I tell my health care provider before I take this medicine? They need to know if you have any of these conditions:  kidney disease  liver disease  an unusual or allergic reaction to rimegepant, other medicines, foods, dyes, or preservatives  pregnant or trying to get pregnant  breast-feeding How should I use this medicine? Take the medicine by mouth. Follow the directions on the prescription label. Leave the tablet in the sealed blister pack until you are ready to take it. With dry hands, open the blister and gently remove the tablet. If the tablet breaks or crumbles, throw it away and take a new tablet out of the blister pack. Place the tablet in the mouth and allow it to dissolve, and then swallow. Do not cut, crush, or chew this medicine. You do not need water to take this medicine. Talk to your pediatrician about the use of this medicine in children. Special care may be needed. Overdosage: If you think you have taken too much of this medicine contact a poison control center or emergency room at once. NOTE: This medicine is only for you. Do not share this medicine with others. What if I miss a dose? This does not apply. This medicine is not for regular  use. What may interact with this medicine? This medicine may interact with the following medications:  certain medicines for fungal infections like fluconazole, itraconazole  rifampin This list may not describe all possible interactions. Give your health care provider a list of all the medicines, herbs, non-prescription drugs, or dietary supplements you use. Also tell them if you smoke, drink alcohol, or use illegal drugs. Some items may interact with your medicine. What should I watch for while using this medicine? Visit your health care professional for regular checks on your progress. Tell your health care professional if your symptoms do not start to get better or if they get worse. What side effects may I notice from receiving this medicine? Side effects that you should report to your doctor or health care professional as soon as possible:  allergic reactions like skin rash, itching or hives; swelling of the face, lips, or tongue Side effects that usually do not require medical attention (report these to your doctor or health care professional if they continue or are bothersome):  nausea This list may not describe all possible side effects. Call your doctor for medical advice about side effects. You may report side effects to FDA at 1-800-FDA-1088. Where should I keep my medicine? Keep out of the reach of children and pets. Store at room temperature between 20 and 25 degrees C (68 and 77 degrees F). Get rid of any unused medicine after the expiration date.  To get rid of medicines that are no longer needed or have expired:  Take the medicine to a medicine take-back program. Check with your pharmacy or law enforcement to find a location.  If you cannot return the medicine, check the label or package insert to see if the medicine should be thrown out in the garbage or flushed down the toilet. If you are not sure, ask your health care provider. If it is safe to put it in the trash, take the  medicine out of the container. Mix the medicine with cat litter, dirt, coffee grounds, or other unwanted substance. Seal the mixture in a bag or container. Put it in the trash. NOTE: This sheet is a summary. It may not cover all possible information. If you have questions about this medicine, talk to your doctor, pharmacist, or health care provider.  2021 Elsevier/Gold Standard (2020-03-21 17:56:55)

## 2020-12-21 DIAGNOSIS — G43919 Migraine, unspecified, intractable, without status migrainosus: Secondary | ICD-10-CM | POA: Insufficient documentation

## 2020-12-21 MED FILL — BUTALB-ACETAMIN-CAFF 50-325: 50-325-40 | 19 days supply | Qty: 75 | Fill #2

## 2020-12-21 NOTE — Assessment & Plan Note (Signed)
Acute Intractable migraine resistant to relpax, aleve, fioricet Depo-medrol 80 mg IM x 1 Toradol 30 mg IM x 1 zofran 4 mg ODT Q 8 hr prn at home Call if no improvement

## 2020-12-21 NOTE — Assessment & Plan Note (Signed)
Chronic Increasing in frequency - ? Why, ? Weather related Having weekly migraines Will try emgality 120 mg [place] monthly nurtec 75 mg ODT prn for migraine since relpax is not helping May not need preventative medication long term - would prefer not to be on preventative med Consider neurology referral

## 2020-12-22 ENCOUNTER — Ambulatory Visit: Payer: 59 | Admitting: Family Medicine

## 2020-12-22 ENCOUNTER — Encounter: Payer: Self-pay | Admitting: Family Medicine

## 2020-12-22 ENCOUNTER — Other Ambulatory Visit: Payer: Self-pay

## 2020-12-22 DIAGNOSIS — M7552 Bursitis of left shoulder: Secondary | ICD-10-CM | POA: Diagnosis not present

## 2020-12-22 NOTE — Progress Notes (Signed)
Mindy [name] - 60 y.o. female MRN [id number]  Date of birth: October 21, 1961  SUBJECTIVE:  Including CC & ROS.  No chief complaint on file.   Mindy [name] is a 60 y.o. female that is following up for her left shoulder pain.  Has been doing well and she is about near to normal.  Still has been going to physical therapy.   Review of Systems See HPI   HISTORY: Past Medical, Surgical, Social, and Family History Reviewed & Updated per EMR.   Pertinent Historical Findings include:  Past Medical History:  Diagnosis Date  . Allergic rhinitis   . Complication of anesthesia   . Elevated transaminase level 12.2014   ?etiology - present 09/2013 hosp for epigastric pain  . Family history of adverse reaction to anesthesia    father - PONV  . GERD (gastroesophageal reflux disease)   . History of bronchitis 10/2014  . Migraine   . PONV (postoperative nausea and vomiting)   . Seasonal allergies     Past Surgical History:  Procedure Laterality Date  . BREAST BIOPSY Right   . BREAST EXCISIONAL BIOPSY Right 2017  . BREAST LUMPECTOMY WITH RADIOACTIVE SEED LOCALIZATION Right 10/08/2016   Procedure: RIGHT BREAST LUMPECTOMY WITH RADIOACTIVE SEED LOCALIZATION;  Surgeon: Coralie Keens, MD;  Location: [place];  Service: General;  Laterality: Right;  . CHOLECYSTECTOMY N/A 09/21/2015   Procedure: LAPAROSCOPIC CHOLECYSTECTOMY;  Surgeon: Coralie Keens, MD;  Location: [place];  Service: General;  Laterality: N/A;  . COLONOSCOPY  12/2013  . NECK SURGERY  2009  . PARTIAL HYSTERECTOMY  2007  . UPPER GI ENDOSCOPY  2016    Family History  Problem Relation Age of Onset  . Atrial fibrillation Father   . Diabetes type II Father   . Hypertension Father   . Coronary artery disease Father 12       stent x 2, AoVR  . Hypertension Mother   . Dementia Mother   . Hypertension Brother   . Breast cancer Maternal Aunt   . Breast cancer Maternal Grandmother   . Breast cancer Paternal Grandmother     Social  History   Socioeconomic History  . Marital status: Divorced    Spouse name: Not on file  . Number of children: Not on file  . Years of education: Not on file  . Highest education level: Not on file  Occupational History  . Occupation: Therapist, sports  Tobacco Use  . Smoking status: Never Smoker  . Smokeless tobacco: Never Used  Vaping Use  . Vaping Use: Never used  Substance and Sexual Activity  . Alcohol use: Yes    Comment: occ  . Drug use: No  . Sexual activity: Not on file  Other Topics Concern  . Not on file  Social History Narrative   Exercise: on average 3/week - running, weights sometimes   Social Determinants of Health   Financial Resource Strain: Not on file  Food Insecurity: Not on file  Transportation Needs: Not on file  Physical Activity: Not on file  Stress: Not on file  Social Connections: Not on file  Intimate Partner Violence: Not on file     PHYSICAL EXAM:  VS: BP 116/78 (BP Location: Right Arm, Patient Position: Sitting, Cuff Size: Normal)   Ht 5\' 4"  (1.626 m)   Wt 126 lb (57.2 kg)   BMI 21.63 kg/m  Physical Exam Gen: NAD, alert, cooperative with exam, well-appearing    ASSESSMENT & PLAN:   Subacromial bursitis of  left shoulder joint Significant improvement of his pain.  Has minor pain with certain movements.  Strength has not completely returned  -Counseled on home exercise therapy and supportive care. -Can continue to complete physical therapy. -Follow-up as needed.

## 2020-12-22 NOTE — Assessment & Plan Note (Signed)
Significant improvement of his pain.  Has minor pain with certain movements.  Strength has not completely returned  -Counseled on home exercise therapy and supportive care. -Can continue to complete physical therapy. -Follow-up as needed.

## 2020-12-22 NOTE — Patient Instructions (Signed)
Good to see you Please continue the exercises   Please send me a message in MyChart with any questions or updates.  Please see [place] back as needed.   --Dr. Raeford Razor

## 2020-12-26 MED FILL — ELETRIPTAN HYDROBROMIDE 40: 40 | 25 days supply | Qty: 10 | Fill #3

## 2020-12-28 ENCOUNTER — Encounter: Payer: Self-pay | Admitting: Internal Medicine

## 2021-01-08 ENCOUNTER — Other Ambulatory Visit (HOSPITAL_BASED_OUTPATIENT_CLINIC_OR_DEPARTMENT_OTHER): Payer: Self-pay

## 2021-01-15 ENCOUNTER — Other Ambulatory Visit: Payer: Self-pay | Admitting: *Deleted

## 2021-01-15 MED ORDER — ALBUTEROL SULFATE HFA 108 (90 BASE) MCG/ACT IN AERS: 2.0000 | INHALATION_SPRAY | RESPIRATORY_TRACT | 3 refills | Status: DC | PRN

## 2021-01-15 MED FILL — ALBUTEROL SULFATE HFA 108 (: 108 (90 BAS | 16 days supply | Qty: 18 | Fill #0

## 2021-01-19 MED FILL — BUTALB-ACETAMIN-CAFF 50-325: 50-325-40 | 19 days supply | Qty: 75 | Fill #3

## 2021-01-30 ENCOUNTER — Other Ambulatory Visit (HOSPITAL_COMMUNITY): Payer: Self-pay

## 2021-01-30 MED FILL — Eletriptan Hydrobromide Tab 40 MG (Base Equivalent): ORAL | 30 days supply | Qty: 10 | Fill #0 | Status: AC

## 2021-01-30 MED FILL — Dexlansoprazole Cap Delayed Release 30 MG: ORAL | 90 days supply | Qty: 90 | Fill #0 | Status: AC

## 2021-01-30 MED FILL — Fluticasone Propionate Nasal Susp 50 MCG/ACT: NASAL | 30 days supply | Qty: 16 | Fill #0 | Status: AC

## 2021-01-31 ENCOUNTER — Other Ambulatory Visit (HOSPITAL_COMMUNITY): Payer: Self-pay

## 2021-02-02 ENCOUNTER — Ambulatory Visit: Payer: 59 | Attending: Internal Medicine

## 2021-02-02 DIAGNOSIS — Z23 Encounter for immunization: Secondary | ICD-10-CM

## 2021-02-02 NOTE — Progress Notes (Signed)
   Covid-19 Vaccination Clinic  Name:  RHYAN WOLTERS    MRN: [id number] DOB: 21-Apr-1961  02/02/2021  Ms. Belzer was observed post Covid-19 immunization for 15 minutes without incident. She was provided with Vaccine Information Sheet and instruction to access the V-Safe system.   Ms. Brazeau was instructed to call 911 with any severe reactions post vaccine: Marland Kitchen Difficulty breathing  . Swelling of face and throat  . A fast heartbeat  . A bad rash all over body  . Dizziness and weakness   Immunizations Administered    Name Date Dose VIS Date Route   PFIZER Comrnaty(Gray TOP) Covid-19 Vaccine 02/02/2021 10:05 AM 0.3 mL 09/28/2020 Intramuscular   Manufacturer: [employer], [employer]   Lot: [id number]   NDC: [phone]

## 2021-02-06 ENCOUNTER — Other Ambulatory Visit (HOSPITAL_COMMUNITY): Payer: Self-pay

## 2021-02-06 ENCOUNTER — Other Ambulatory Visit (HOSPITAL_BASED_OUTPATIENT_CLINIC_OR_DEPARTMENT_OTHER): Payer: Self-pay

## 2021-02-06 MED ORDER — PFIZER-BIONT COVID-19 VAC-TRIS 30 MCG/0.3ML IM SUSP
INTRAMUSCULAR | 0 refills | Status: DC
  Filled 2021-02-06: qty 0.3, 1d supply, fill #0

## 2021-02-08 ENCOUNTER — Other Ambulatory Visit (HOSPITAL_BASED_OUTPATIENT_CLINIC_OR_DEPARTMENT_OTHER): Payer: Self-pay

## 2021-02-17 ENCOUNTER — Other Ambulatory Visit (HOSPITAL_COMMUNITY): Payer: Self-pay

## 2021-02-17 MED FILL — Butalbital-Acetaminophen-Caffeine Tab 50-325-40 MG: ORAL | 18 days supply | Qty: 75 | Fill #0 | Status: AC

## 2021-02-20 ENCOUNTER — Other Ambulatory Visit (HOSPITAL_COMMUNITY): Payer: Self-pay

## 2021-02-26 ENCOUNTER — Other Ambulatory Visit (HOSPITAL_COMMUNITY): Payer: Self-pay

## 2021-02-26 MED FILL — Eletriptan Hydrobromide Tab 40 MG (Base Equivalent): ORAL | 30 days supply | Qty: 10 | Fill #1 | Status: AC

## 2021-03-18 MED FILL — Butalbital-Acetaminophen-Caffeine Tab 50-325-40 MG: ORAL | 18 days supply | Qty: 75 | Fill #1 | Status: AC

## 2021-03-20 ENCOUNTER — Other Ambulatory Visit (HOSPITAL_COMMUNITY): Payer: Self-pay

## 2021-03-26 ENCOUNTER — Other Ambulatory Visit (HOSPITAL_COMMUNITY): Payer: Self-pay

## 2021-03-26 MED FILL — Estrogens, Conjugated Tab 0.9 MG: ORAL | 90 days supply | Qty: 90 | Fill #0 | Status: AC

## 2021-03-27 ENCOUNTER — Other Ambulatory Visit (HOSPITAL_COMMUNITY): Payer: Self-pay

## 2021-03-27 MED FILL — Eletriptan Hydrobromide Tab 40 MG (Base Equivalent): ORAL | 30 days supply | Qty: 10 | Fill #2 | Status: AC

## 2021-04-15 MED FILL — Dexlansoprazole Cap Delayed Release 30 MG: ORAL | 90 days supply | Qty: 90 | Fill #1 | Status: AC

## 2021-04-16 ENCOUNTER — Other Ambulatory Visit (HOSPITAL_COMMUNITY): Payer: Self-pay

## 2021-04-16 MED ORDER — BUTALBITAL-APAP-CAFFEINE 50-325-40 MG PO TABS
ORAL_TABLET | ORAL | 5 refills | Status: DC
  Filled 2021-04-16: qty 75, 18d supply, fill #0
  Filled 2021-05-11: qty 75, 18d supply, fill #1
  Filled 2021-06-12: qty 75, 18d supply, fill #2
  Filled 2021-07-09: qty 75, 18d supply, fill #3
  Filled 2021-08-05: qty 75, 18d supply, fill #4

## 2021-04-16 NOTE — Telephone Encounter (Signed)
Dr Annamaria Boots- please advise if okay to refill fiorcet  Last rx #75 tablets with 5 rf on 10/18/20 Thanks

## 2021-04-16 NOTE — Telephone Encounter (Signed)
Fioricet refilled

## 2021-04-17 DIAGNOSIS — H52223 Regular astigmatism, bilateral: Secondary | ICD-10-CM | POA: Diagnosis not present

## 2021-04-17 DIAGNOSIS — H5203 Hypermetropia, bilateral: Secondary | ICD-10-CM | POA: Diagnosis not present

## 2021-04-17 DIAGNOSIS — H524 Presbyopia: Secondary | ICD-10-CM | POA: Diagnosis not present

## 2021-05-01 MED FILL — Eletriptan Hydrobromide Tab 40 MG (Base Equivalent): ORAL | 30 days supply | Qty: 10 | Fill #3 | Status: CN

## 2021-05-02 ENCOUNTER — Other Ambulatory Visit (HOSPITAL_COMMUNITY): Payer: Self-pay

## 2021-05-08 ENCOUNTER — Other Ambulatory Visit (HOSPITAL_COMMUNITY): Payer: Self-pay

## 2021-05-12 ENCOUNTER — Other Ambulatory Visit (HOSPITAL_COMMUNITY): Payer: Self-pay

## 2021-05-14 ENCOUNTER — Other Ambulatory Visit (HOSPITAL_COMMUNITY): Payer: Self-pay

## 2021-05-18 ENCOUNTER — Other Ambulatory Visit (HOSPITAL_COMMUNITY): Payer: Self-pay

## 2021-05-19 ENCOUNTER — Other Ambulatory Visit (HOSPITAL_COMMUNITY): Payer: Self-pay

## 2021-05-21 ENCOUNTER — Other Ambulatory Visit (HOSPITAL_COMMUNITY): Payer: Self-pay

## 2021-06-13 ENCOUNTER — Other Ambulatory Visit (HOSPITAL_COMMUNITY): Payer: Self-pay

## 2021-06-15 ENCOUNTER — Other Ambulatory Visit (HOSPITAL_COMMUNITY): Payer: Self-pay

## 2021-06-15 MED FILL — Eletriptan Hydrobromide Tab 40 MG (Base Equivalent): ORAL | 30 days supply | Qty: 10 | Fill #3 | Status: CN

## 2021-06-18 ENCOUNTER — Other Ambulatory Visit: Payer: Self-pay | Admitting: Internal Medicine

## 2021-06-18 ENCOUNTER — Other Ambulatory Visit (HOSPITAL_COMMUNITY): Payer: Self-pay

## 2021-06-18 ENCOUNTER — Encounter: Payer: Self-pay | Admitting: Internal Medicine

## 2021-06-19 ENCOUNTER — Other Ambulatory Visit (HOSPITAL_COMMUNITY): Payer: Self-pay

## 2021-06-19 MED ORDER — RIZATRIPTAN BENZOATE 10 MG PO TBDP
10.0000 mg | ORAL_TABLET | ORAL | 11 refills | Status: DC | PRN
  Filled 2021-06-19: qty 10, 30d supply, fill #0
  Filled 2021-07-22: qty 10, 30d supply, fill #1
  Filled 2021-08-22: qty 10, 30d supply, fill #2
  Filled 2021-09-23: qty 10, 30d supply, fill #3
  Filled 2021-10-31: qty 10, 30d supply, fill #4
  Filled 2021-11-27: qty 10, 30d supply, fill #5
  Filled 2021-12-24: qty 10, 30d supply, fill #6
  Filled 2022-01-23: qty 10, 30d supply, fill #7
  Filled 2022-02-19: qty 10, 30d supply, fill #8
  Filled 2022-03-21: qty 10, 30d supply, fill #9
  Filled 2022-04-21: qty 10, 30d supply, fill #10
  Filled 2022-05-21: qty 10, 30d supply, fill #11

## 2021-06-22 ENCOUNTER — Telehealth: Payer: Self-pay

## 2021-06-28 NOTE — Patient Instructions (Addendum)
   An EKG was done today.  A holter monitor was ordered.    Blood work was ordered.     Medications changes include :   none    A referral was ordered for  cardiology.      Someone from their office will call you to schedule an appointment.

## 2021-06-28 NOTE — Progress Notes (Signed)
Subjective:    Patient ID: Mindy [name], female    DOB: 16-Jul-1961, 60 y.o.   MRN: [id number]  This visit occurred during the SARS-CoV-2 public health emergency.  Safety protocols were in place, including screening questions prior to the visit, additional usage of staff PPE, and extensive cleaning of exam room while observing appropriate contact time as indicated for disinfecting solutions.    HPI The patient is here for an acute visit.   Increased HR - since July she was walking/running and her HR would get elevated to where she could not run. It would get close to 200.  She would start to feel it around 170 and at 180 she was not able to run.  She has only been walking recently because of this.  Resting HR is mid70's.  Just walking across her house it will get to 110-115.  She can feel it.  It does not do that all the time.  Sometimes it goes right back down and something it stays elevated for a while 10-15 minutes.    When it gets really high -she gets nauseated, lightheaded and feels bad.    She stopped taking sudafed.  No changes with meds.  She has a coke a day - maybe 2 - that has not changed.  She drinks minimal alcohol.    No heart racing/palpitations occurs several times a week with normal activity, but occurs every time with exercise.  Walking quickly the other day her HR got up to 138.     Medications and allergies reviewed with patient and updated if appropriate.  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 03/14/2017   Low back pain 02/13/2017   Right sided sciatica 02/13/2017   RLQ abdominal pain 08/20/2016   Anal itching 08/20/2016   GERD (gastroesophageal reflux disease) 06/05/2016   Migraine 12/27/2015   Headache 12/27/2015   Lateral epicondylitis of left elbow 06/27/2015   Cervical radiculitis 06/27/2015   Non-allergic vasomotor rhinitis 06/01/2012    Current Outpatient Medications on File Prior to Visit  Medication Sig Dispense Refill   albuterol (VENTOLIN HFA) 108 (90 Base) MCG/ACT inhaler INHALE 2 PUFFS INTO THE LUNGS EVERY 4 HOURS AS NEEDED FOR WHEEZING OR SHORTNESS OF BREATH 18 g 3   azelastine (ASTELIN) 0.1 % nasal spray Place 1-2 sprays into both nostrils 2 (two) times daily. Use in each nostril as directed 30 mL prn   butalbital-acetaminophen-caffeine (FIORICET) 50-325-40 MG tablet TAKE 1 TABLET BY MOUTH EVERY 6 HOURS AS NEEDED FOR HEADACHE 75 tablet 5   Dexlansoprazole 30 MG capsule TAKE 1 CAPSULE BY MOUTH ONCE A DAY 90 capsule 3   estrogens, conjugated, (PREMARIN) 0.9 MG tablet TAKE 1 TABLET BY MOUTH ONCE A DAY 90 tablet 4   fluticasone (FLONASE) 50 MCG/ACT nasal spray PLACE 2 SPRAYS INTO BOTH NOSTRILS DAILY. 16 g 6   linaclotide (LINZESS) 72 MCG capsule TAKE 1 CAPSULE BY MOUTH DAILY BEFORE BREAKFAST  90 capsule 1   naproxen sodium (ALEVE) 220 MG tablet Take 220 mg by mouth 2 (two) times daily as needed.     ondansetron (ZOFRAN-ODT) 4 MG disintegrating tablet DISSOLVE 1 TABLET BY MOUTH EVERY 8 HOURS AS NEEDED FOR NAUSEA (MIGRAINES) 20 tablet 0   Pramoxine-HC (HYDROCORTISONE ACE-PRAMOXINE) 2.5-1 % CREA Apply TID-QID 57 g 2   PREMARIN 0.9 MG tablet Take 0.9 mg by mouth daily.     pseudoephedrine (SUDAFED) 30 MG tablet Per box as needed     rizatriptan (MAXALT-MLT) 10 MG disintegrating tablet Take 1 tablet by mouth as needed for migraine. May repeat in 2 hours if needed  10 tablet 11   sodium chloride (OCEAN) 0.65 % SOLN nasal spray Place 1 spray into both nostrils as needed for congestion. 50 mL 0   triamcinolone (KENALOG) 0.1 % Apply topically 2 (two) times daily.     No current facility-administered medications on file prior to visit.    Past Medical History:  Diagnosis Date   Allergic rhinitis    Complication of anesthesia    Elevated transaminase level 12.2014   ?etiology - present 09/2013 hosp for epigastric pain   Family history of adverse reaction to anesthesia    father - PONV   GERD (gastroesophageal reflux disease)    History of bronchitis 10/2014   Migraine    PONV (postoperative nausea and vomiting)    Seasonal allergies     Past Surgical History:  Procedure Laterality Date   BREAST BIOPSY Right    BREAST EXCISIONAL BIOPSY Right 2017   BREAST LUMPECTOMY WITH RADIOACTIVE SEED LOCALIZATION Right 10/08/2016   Procedure: RIGHT BREAST LUMPECTOMY WITH RADIOACTIVE SEED LOCALIZATION;  Surgeon: Coralie Keens, MD;  Location: [facility];  Service: General;  Laterality: Right;   CHOLECYSTECTOMY N/A 09/21/2015   Procedure: LAPAROSCOPIC CHOLECYSTECTOMY;  Surgeon: Coralie Keens, MD;  Location: [facility];  Service: General;  Laterality: N/A;   COLONOSCOPY  12/2013   NECK SURGERY  2009   PARTIAL HYSTERECTOMY  2007   UPPER GI ENDOSCOPY  2016    Social History   Socioeconomic History   Marital status: Divorced    Spouse name: Not on file   Number of children: Not on file   Years of education: Not on file   Highest education level: Not on file  Occupational History   Occupation: RN  Tobacco Use   Smoking status: Never   Smokeless tobacco: Never  Vaping Use   Vaping Use: Never used  Substance and Sexual Activity   Alcohol use: Yes    Comment: occ   Drug use: No   Sexual activity: Not on file  Other Topics Concern   Not on file  Social History Narrative   Exercise: on average 3/week - running, weights sometimes   Social Determinants of  Health   Financial Resource Strain: Not on file  Food Insecurity: Not on file  Transportation Needs: Not on file  Physical Activity: Not on file  Stress: Not on file  Social Connections: Not on file    Family History  Problem Relation Age of Onset   Atrial fibrillation Father    Diabetes type II Father    Hypertension Father    Coronary artery disease Father 7       stent x 2, AoVR   Hypertension Mother    Dementia Mother    Hypertension Brother    Breast cancer Maternal Aunt    Breast cancer Maternal Grandmother    Breast cancer  Paternal Grandmother     Review of Systems  Constitutional:  Negative for fever.  Respiratory:  Negative for shortness of breath.   Cardiovascular:  Positive for palpitations. Negative for chest pain and leg swelling.  Gastrointestinal:  Positive for nausea (with elevated HR).  Neurological:  Positive for light-headedness (with elevated HR).      Objective:   Vitals:   06/29/21 1024  BP: 112/76  Pulse: 66  Temp: 98.6 F (37 C)  SpO2: 96%   BP Readings from Last 3 Encounters:  06/29/21 112/76  12/22/20 116/78  12/20/20 120/72   Wt Readings from Last 3 Encounters:  06/29/21 130 lb 6.4 oz (59.1 kg)  12/22/20 126 lb (57.2 kg)  12/20/20 126 lb (57.2 kg)   Body mass index is 22.38 kg/m.   Physical Exam    Constitutional: Appears well-developed and well-nourished. No distress.  Head: Normocephalic and atraumatic.  Neck: Neck supple. No tracheal deviation present. No thyromegaly present.  No cervical lymphadenopathy Cardiovascular: Normal rate, regular rhythm and normal heart sounds.  No murmur heard. No carotid bruit .  No edema Pulmonary/Chest: Effort normal and breath sounds normal. No respiratory distress. No has no wheezes. No rales.  Skin: Skin is warm and dry. Not diaphoretic.  Psychiatric: Normal mood and affect. Behavior is normal.    Lab Results  Component Value Date   WBC 6.2 09/11/2020   HGB 13.9 09/11/2020   HCT  40.8 09/11/2020   PLT 283.0 09/11/2020   GLUCOSE 88 09/11/2020   CHOL 201 (H) 09/11/2020   TRIG 116.0 09/11/2020   HDL 79.00 09/11/2020   LDLCALC 98 09/11/2020   ALT 9 09/11/2020   AST 12 09/11/2020   NA 131 (L) 09/11/2020   K 4.3 09/11/2020   CL 96 09/11/2020   CREATININE 0.78 09/11/2020   BUN 12 09/11/2020   CO2 29 09/11/2020   TSH 1.93 09/11/2020   INR 1.10 09/20/2013   HGBA1C 5.1 09/11/2020      Assessment & Plan:    See Problem List for Assessment and Plan of chronic medical problems.

## 2021-06-29 ENCOUNTER — Encounter: Payer: Self-pay | Admitting: Internal Medicine

## 2021-06-29 ENCOUNTER — Other Ambulatory Visit: Payer: Self-pay

## 2021-06-29 ENCOUNTER — Ambulatory Visit: Payer: 59 | Admitting: Internal Medicine

## 2021-06-29 ENCOUNTER — Ambulatory Visit (INDEPENDENT_AMBULATORY_CARE_PROVIDER_SITE_OTHER): Payer: 59

## 2021-06-29 VITALS — BP 112/76 | HR 66 | Temp 98.6°F | Ht 64.0 in | Wt 130.4 lb

## 2021-06-29 DIAGNOSIS — R002 Palpitations: Secondary | ICD-10-CM | POA: Diagnosis not present

## 2021-06-29 DIAGNOSIS — Z0000 Encounter for general adult medical examination without abnormal findings: Secondary | ICD-10-CM

## 2021-06-29 LAB — CBC WITH DIFFERENTIAL/PLATELET
Basophils Absolute: 0 10*3/uL (ref 0.0–0.1)
Basophils Relative: 0.9 % (ref 0.0–3.0)
Eosinophils Absolute: 0.1 10*3/uL (ref 0.0–0.7)
Eosinophils Relative: 1.8 % (ref 0.0–5.0)
HCT: 40.1 % (ref 36.0–46.0)
Hemoglobin: 13.6 g/dL (ref 12.0–15.0)
Lymphocytes Relative: 42.9 % (ref 12.0–46.0)
Lymphs Abs: 1.7 10*3/uL (ref 0.7–4.0)
MCHC: 33.9 g/dL (ref 30.0–36.0)
MCV: 96.3 fl (ref 78.0–100.0)
Monocytes Absolute: 0.4 10*3/uL (ref 0.1–1.0)
Monocytes Relative: 11.2 % (ref 3.0–12.0)
Neutro Abs: 1.7 10*3/uL (ref 1.4–7.7)
Neutrophils Relative %: 43.2 % (ref 43.0–77.0)
Platelets: 252 10*3/uL (ref 150.0–400.0)
RBC: 4.16 Mil/uL (ref 3.87–5.11)
RDW: 12.5 % (ref 11.5–15.5)
WBC: 3.9 10*3/uL — ABNORMAL LOW (ref 4.0–10.5)

## 2021-06-29 LAB — COMPREHENSIVE METABOLIC PANEL
ALT: 11 U/L (ref 0–35)
AST: 13 U/L (ref 0–37)
Albumin: 4 g/dL (ref 3.5–5.2)
Alkaline Phosphatase: 71 U/L (ref 39–117)
BUN: 8 mg/dL (ref 6–23)
CO2: 31 mEq/L (ref 19–32)
Calcium: 9.4 mg/dL (ref 8.4–10.5)
Chloride: 100 mEq/L (ref 96–112)
Creatinine, Ser: 0.84 mg/dL (ref 0.40–1.20)
GFR: 76.78 mL/min (ref >60.00)
Glucose, Bld: 86 mg/dL (ref 70–99)
Potassium: 4.6 mEq/L (ref 3.5–5.1)
Sodium: 138 mEq/L (ref 135–145)
Total Bilirubin: 0.3 mg/dL (ref 0.2–1.2)
Total Protein: 6.6 g/dL (ref 6.0–8.3)

## 2021-06-29 LAB — LIPID PANEL
Cholesterol: 213 mg/dL — ABNORMAL HIGH (ref 0–200)
HDL: 77.2 mg/dL (ref >39.00)
LDL Cholesterol: 119 mg/dL — ABNORMAL HIGH (ref 0–99)
NonHDL: 136.29
Total CHOL/HDL Ratio: 3
Triglycerides: 84 mg/dL (ref 0.0–149.0)
VLDL: 16.8 mg/dL (ref 0.0–40.0)

## 2021-06-29 LAB — TSH: TSH: 1.75 u[IU]/mL (ref 0.35–5.50)

## 2021-06-29 NOTE — Progress Notes (Unsigned)
Enrolled patient for a 3 day Zio XT monitor to be mailed to patients home  

## 2021-06-29 NOTE — Addendum Note (Signed)
Addended by: Binnie Rail on: 06/29/2021 12:33 PM   Modules accepted: Orders

## 2021-06-29 NOTE — Assessment & Plan Note (Signed)
Acute Approximately 2 months of elevated heart rate/palpitations Both parents have atrial fibrillation and that was one of her concerns Occurs with exercise up to 180 or so, but can get up to 115 with walking across the room.  When heart rate is very high she feels lightheaded, nauseous and just overall feels bad EKG today normal sinus rhythm at 61 bpm, normal EKG.  EKG from 2014 showed sinus tachycardia at 101, but was normal-no significant change from that EKG. Holter monitor ordered Check CBC, CMP and TSH Referral to cardiology ordered

## 2021-07-09 ENCOUNTER — Other Ambulatory Visit (HOSPITAL_COMMUNITY): Payer: Self-pay

## 2021-07-09 DIAGNOSIS — R002 Palpitations: Secondary | ICD-10-CM | POA: Diagnosis not present

## 2021-07-18 DIAGNOSIS — R002 Palpitations: Secondary | ICD-10-CM | POA: Diagnosis not present

## 2021-07-22 MED FILL — Linaclotide Cap 72 MCG: ORAL | 90 days supply | Qty: 90 | Fill #0 | Status: AC

## 2021-07-23 ENCOUNTER — Other Ambulatory Visit: Payer: Self-pay | Admitting: Internal Medicine

## 2021-07-23 ENCOUNTER — Other Ambulatory Visit (HOSPITAL_COMMUNITY): Payer: Self-pay

## 2021-07-23 MED ORDER — ONDANSETRON 4 MG PO TBDP
ORAL_TABLET | ORAL | 3 refills | Status: DC
  Filled 2021-07-23: qty 20, 7d supply, fill #0
  Filled 2021-11-27: qty 20, 7d supply, fill #1
  Filled 2022-01-23: qty 20, 7d supply, fill #2
  Filled 2022-04-21: qty 20, 7d supply, fill #3

## 2021-07-23 MED FILL — Fluticasone Propionate Nasal Susp 50 MCG/ACT: NASAL | 30 days supply | Qty: 16 | Fill #1 | Status: AC

## 2021-07-27 ENCOUNTER — Ambulatory Visit: Payer: 59 | Attending: Internal Medicine

## 2021-07-27 ENCOUNTER — Other Ambulatory Visit (HOSPITAL_BASED_OUTPATIENT_CLINIC_OR_DEPARTMENT_OTHER): Payer: Self-pay

## 2021-07-27 DIAGNOSIS — Z23 Encounter for immunization: Secondary | ICD-10-CM

## 2021-07-27 MED ORDER — INFLUENZA VAC SPLIT QUAD 0.5 ML IM SUSY
PREFILLED_SYRINGE | INTRAMUSCULAR | 0 refills | Status: DC
  Filled 2021-07-27: qty 0.5, 1d supply, fill #0

## 2021-07-27 NOTE — Progress Notes (Signed)
   Covid-19 Vaccination Clinic  Name:  Mindy [name]    MRN: [id number] DOB: [birth year]  07/27/2021  Mindy [name] was observed post Covid-19 immunization for 15 minutes without incident. She was provided with Vaccine Information Sheet and instruction to access the V-Safe system.   Mindy [name] was instructed to call 911 with any severe reactions post vaccine: Difficulty breathing  Swelling of face and throat  A fast heartbeat  A bad rash all over body  Dizziness and weakness

## 2021-07-30 ENCOUNTER — Other Ambulatory Visit (HOSPITAL_COMMUNITY): Payer: 59

## 2021-08-06 ENCOUNTER — Other Ambulatory Visit (HOSPITAL_COMMUNITY): Payer: Self-pay

## 2021-08-07 ENCOUNTER — Other Ambulatory Visit (HOSPITAL_BASED_OUTPATIENT_CLINIC_OR_DEPARTMENT_OTHER): Payer: Self-pay

## 2021-08-07 ENCOUNTER — Other Ambulatory Visit (HOSPITAL_COMMUNITY): Payer: Self-pay

## 2021-08-07 MED ORDER — COVID-19MRNA BIVAL VACC PFIZER 30 MCG/0.3ML IM SUSP
INTRAMUSCULAR | 0 refills | Status: DC
  Filled 2021-08-07: qty 0.3, 1d supply, fill #0

## 2021-08-07 MED FILL — Estrogens, Conjugated Tab 0.9 MG: ORAL | 90 days supply | Qty: 90 | Fill #1 | Status: AC

## 2021-08-08 NOTE — Progress Notes (Signed)
Patient ID: Mindy [name], female    DOB: Mar 10, 1961, 60 y.o.   MRN: [id number]  HPI female never smoker followed for chronic rhinitis/sinusitis, complicated by headache Allergy profile 12/27/13- Neg, Total IgE 4.5 Had failed allergy vaccine Eos not elevated 09/12/14 Sinus xray 12/12/2015-no evidence of acute sinusitis, clear -------------------------------------------------------------------   08/03/20- 60 year old female never smoker followed for chronic rhinitis/sinusitis, complicated by Migraine, superficial thrombophlebitis, Cervical disc disease, GERD,  Fioricet, sudafed, Ventolin hfa, Flonase/ Astelin or Dymista,       Relpax, Norco, Robaxin, Diclofenac, Pamelor,  Covid vax- 3 Phizer Flu vax- had ------1 yr f/u - c/o seasonal nasal stuffiness - Denies cough wheeze or sob Seasonal stuffiness. Reviewed her experience with available medication classes. She likes Dymista better than the components, but insurance won't cover. Suggested looking into GoodRX.  No wheeze/ cough.  08/09/21- 60 year old female never smoker followed for chronic rhinitis/sinusitis,Insomnia,  complicated by Migraine, superficial thrombophlebitis, Cervical disc disease, GERD,  -Fioricet, sudafed, Ventolin hfa, Flonase/ Astelin or Dymista,       Relpax, Norco, Robaxin, -Diclofenac, Pamelor,  Covid vax- 5 Phizer Flu vax-had Zio XT monitor for palpitation> SVT Rarely needs rescue inhaler- cites exposure to cigarette smoke as a trigger.  Rhinitis currently controlled with Flonase used when needed.  Chronic headaches still respond to Fioricet  Review of Systems- see HPI   +  = positive Constitutional:   No-   weight loss, night sweats, fevers, chills, fatigue, lassitude. HEENT:   +  headaches,  No-difficulty swallowing, tooth/dental problems, sore throat,       sneezing, itching, ear ache, +nasal congestion, post nasal drip,  CV:  chest pain, orthopnea, PND, swelling in lower extremities, anasarca, dizziness,  palpitations Resp: shortness of breath with exertion or at rest.              No-   productive cough,  non-productive cough,  No-  coughing up of blood.              No-   change in color of mucus.  No- wheezing.   Skin: No-   rash or lesions. GI:  No-   heartburn, indigestion, abdominal pain, nausea, vomiting,  GU:  MS:  No-   joint pain or swelling.   Neuro- nothing unusual Psych:  No- change in mood or affect. No depression or anxiety.  No memory loss.  Objective:   Physical Exam General- Alert, Oriented, Affect-appropriate, Distress- none  Skin- rash-none, lesions- none, excoriation- none Lymphadenopathy- none Head- atraumatic            Eyes- Gross vision intact, PERRLA, conjunctivae clear secretions            Ears- Hearing, canals            Nose- , +turbinate edema, +Septal dev, no-mucus,  No-polyps, erosion, perforation.     Masked            Throat- Mallampati III-IV , mucosa clear , drainage- none, tonsils- atrophic Neck- flexible , trachea midline, no stridor , thyroid nl, carotid no bruit Chest - symmetrical excursion , unlabored           Heart/CV- RRR , no murmur , no gallop  , no rub, nl s1 s2                           - JVD- none , edema- none, stasis changes- none, varices- none  Lung-  wheeze -none, cough-none , dullness-none, rub- none           Chest wall-  Abd-  Br/ Gen/ Rectal- Not done, not indicated Extrem- cyanosis- none, clubbing, none, atrophy- none, strength- nl Neuro- grossly intact to observation

## 2021-08-09 ENCOUNTER — Other Ambulatory Visit (HOSPITAL_COMMUNITY): Payer: Self-pay

## 2021-08-09 ENCOUNTER — Other Ambulatory Visit: Payer: Self-pay

## 2021-08-09 ENCOUNTER — Ambulatory Visit: Payer: 59 | Admitting: Internal Medicine

## 2021-08-09 ENCOUNTER — Encounter: Payer: Self-pay | Admitting: Internal Medicine

## 2021-08-09 DIAGNOSIS — J300 Vasomotor rhinitis: Secondary | ICD-10-CM | POA: Diagnosis not present

## 2021-08-09 DIAGNOSIS — J4520 Mild intermittent asthma, uncomplicated: Secondary | ICD-10-CM

## 2021-08-09 DIAGNOSIS — G43911 Migraine, unspecified, intractable, with status migrainosus: Secondary | ICD-10-CM

## 2021-08-09 MED ORDER — FLUTICASONE PROPIONATE 50 MCG/ACT NA SUSP
2.0000 | Freq: Every day | NASAL | 6 refills | Status: DC
  Filled 2021-08-09 – 2022-01-23 (×2): qty 16, 30d supply, fill #0
  Filled 2022-04-21: qty 16, 30d supply, fill #1
  Filled 2022-06-20: qty 16, 30d supply, fill #2
  Filled 2022-07-17: qty 16, 30d supply, fill #3

## 2021-08-09 MED ORDER — ALBUTEROL SULFATE HFA 108 (90 BASE) MCG/ACT IN AERS
2.0000 | INHALATION_SPRAY | RESPIRATORY_TRACT | 3 refills | Status: DC | PRN
  Filled 2021-08-09: qty 18, 16d supply, fill #0

## 2021-08-09 MED ORDER — BUTALBITAL-APAP-CAFFEINE 50-325-40 MG PO TABS
ORAL_TABLET | ORAL | 5 refills | Status: DC
  Filled 2021-08-09: qty 75, fill #0
  Filled 2021-09-02: qty 75, 19d supply, fill #0
  Filled 2021-09-23: qty 75, 19d supply, fill #1
  Filled 2021-10-31: qty 75, 19d supply, fill #2
  Filled 2021-11-27: qty 75, 19d supply, fill #3
  Filled 2021-12-24: qty 75, 19d supply, fill #4
  Filled 2022-01-23: qty 75, 19d supply, fill #5

## 2021-08-09 MED ORDER — TRELEGY ELLIPTA 200-62.5-25 MCG/ACT IN AEPB: 1.0000 | INHALATION_SPRAY | Freq: Every day | RESPIRATORY_TRACT | 0 refills | Status: DC

## 2021-08-09 NOTE — Assessment & Plan Note (Signed)
Mild intermittent uncomplicated Plan- keep albuterol rescue inhaler available

## 2021-08-09 NOTE — Assessment & Plan Note (Signed)
Plan- Fioricet refilled for occasional use

## 2021-08-09 NOTE — Patient Instructions (Signed)
Meds refilled  Please call if we can help 

## 2021-08-09 NOTE — Assessment & Plan Note (Signed)
Flonase usually helps keep under control Plan- flonase refilled

## 2021-08-15 ENCOUNTER — Other Ambulatory Visit: Payer: Self-pay

## 2021-08-15 ENCOUNTER — Encounter: Payer: Self-pay | Admitting: Cardiology

## 2021-08-15 ENCOUNTER — Ambulatory Visit: Payer: 59 | Admitting: Cardiology

## 2021-08-15 VITALS — BP 108/65 | HR 76 | Ht 64.0 in | Wt 131.2 lb

## 2021-08-15 DIAGNOSIS — R002 Palpitations: Secondary | ICD-10-CM | POA: Diagnosis not present

## 2021-08-15 DIAGNOSIS — R0602 Shortness of breath: Secondary | ICD-10-CM

## 2021-08-15 NOTE — Patient Instructions (Addendum)
Medication Instructions:  Your physician recommends that you continue on your current medications as directed. Please refer to the Current Medication list given to you today.  *If you need a refill on your cardiac medications before your next appointment, please call your pharmacy*   Lab Work: None If you have labs (blood work) drawn today and your tests are completely normal, you will receive your results only by: [place] (if you have MyChart) OR A paper copy in the mail If you have any lab test that is abnormal or we need to change your treatment, we will call you to review the results.   Testing/Procedures: Your physician has requested that you have an echocardiography is a painless test that uses sound waves to create images of your heart. It provides your doctor with information about the size and shape of your heart and how well your heart's chambers and valves are working. This procedure takes approximately one hour. There are no restrictions for this procedure.    Follow-Up: At [facility], you and your health needs are our priority.  As part of our continuing mission to provide you with exceptional heart care, we have created designated Provider Care Teams.  These Care Teams include your primary Cardiologist (physician) and Advanced Practice Providers (APPs -  Physician Assistants and Nurse Practitioners) who all work together to provide you with the care you need, when you need it.  We recommend signing up for the patient portal called "MyChart".  Sign up information is provided on this After Visit Summary.  MyChart is used to connect with patients for Virtual Visits (Telemedicine).  Patients are able to view lab/test results, encounter notes, upcoming appointments, etc.  Non-urgent messages can be sent to your provider as well.   To learn more about what you can do with MyChart, go to [email or web address].    Your next appointment:   6 month(s)  The format for your  next appointment:   In Person  Provider:   Berniece Salines, DO [address], [place], [place] [address]    Other Instructions  KardiaMobile [email or web address]        FDA-cleared, clinical grade mobile EKG monitor: Jodelle Red is the most clinically-validated mobile EKG used by the world's leading cardiac care medical professionals With Basic service, know instantly if your heart rhythm is normal or if atrial fibrillation is detected, and email the last single EKG recording to yourself or your doctor Premium service, available for purchase through the Kardia app for $9.99 per month or $99 per year, includes unlimited history and storage of your EKG recordings, a monthly EKG summary report to share with your doctor, along with the ability to track your blood pressure, activity and weight Includes one KardiaMobile phone clip FREE SHIPPING: Standard delivery 1-3 business days. Orders placed by 11:00am PST will ship that afternoon. Otherwise, will ship next business day. All orders ship via [employer] from [place], [place] - sending an EKG [employer] and set up profile. Run EKG - by placing 1-2 fingers on the silver plates After EKG is complete - Download PDF  - Skip password (if you apply a password the provider will need it to view the EKG) Click share button (square with upward arrow) in bottom left corner To send: choose MyChart (first time log into MyChart)  Pop up window about sending ECG Click continue Choose type of message Choose provider Type subject and message Click send (EKG should be attached)  -  To send additional EKGs in one message click the paperclip image and bottom of page to attach.

## 2021-08-15 NOTE — Progress Notes (Signed)
Cardiology Office Note:    Date:  08/15/2021   ID:  Mindy [name], DOB Mar 29, 1961, MRN [id number]  PCP:  Binnie Rail, MD  Cardiologist:  Berniece Salines, DO  Electrophysiologist:  None   Referring MD: Binnie Rail, MD   " I am having palpitations"  History of Present Illness:    Mindy [name] is a 60 y.o. female with a hx of GERD, migraines is here today to be evaluated for palpitations without shortness of breath.  The patient tells me that this has been going on for few months now but she feels her heart pounding significantly sometimes when she is doing activity and sometimes when she is just standing there.  Is becoming concerned because she is getting significantly short of breath 20s episodes are occurring.  Thankfully she has not passed out.  At times when this occurs she has some headaches but she notes that she has a history of migraine headaches.  She does have some lightheadedness but no significant dizziness.  Past Medical History:  Diagnosis Date   Allergic rhinitis    Complication of anesthesia    Elevated transaminase level 12.2014   ?etiology - present 09/2013 hosp for epigastric pain   Family history of adverse reaction to anesthesia    father - PONV   GERD (gastroesophageal reflux disease)    History of bronchitis 10/2014   Migraine    PONV (postoperative nausea and vomiting)    Seasonal allergies     Past Surgical History:  Procedure Laterality Date   BREAST BIOPSY Right    BREAST EXCISIONAL BIOPSY Right 2017   BREAST LUMPECTOMY WITH RADIOACTIVE SEED LOCALIZATION Right 10/08/2016   Procedure: RIGHT BREAST LUMPECTOMY WITH RADIOACTIVE SEED LOCALIZATION;  Surgeon: Coralie Keens, MD;  Location: [place];  Service: General;  Laterality: Right;   CHOLECYSTECTOMY N/A 09/21/2015   Procedure: LAPAROSCOPIC CHOLECYSTECTOMY;  Surgeon: Coralie Keens, MD;  Location: [facility];  Service: General;  Laterality: N/A;   COLONOSCOPY  12/2013   NECK SURGERY  2009   PARTIAL  HYSTERECTOMY  2007   UPPER GI ENDOSCOPY  2016    Current Medications: Current Meds  Medication Sig   albuterol (VENTOLIN HFA) 108 (90 Base) MCG/ACT inhaler INHALE 2 PUFFS INTO THE LUNGS EVERY 4 HOURS AS NEEDED FOR WHEEZING OR SHORTNESS OF BREATH   butalbital-acetaminophen-caffeine (FIORICET) 50-325-40 MG tablet TAKE 1 TABLET BY MOUTH EVERY 6 HOURS AS NEEDED FOR HEADACHE   cetirizine (ZYRTEC) 10 MG tablet 1 tablet   [employer] SUNSCREEN SPF 30 EX apply   Dexlansoprazole 30 MG capsule TAKE 1 CAPSULE BY MOUTH ONCE A DAY   fluticasone (FLONASE) 50 MCG/ACT nasal spray PLACE 2 SPRAYS INTO BOTH NOSTRILS DAILY.   hydrocortisone 2.5 % cream 1 application to affected area   linaclotide (LINZESS) 72 MCG capsule TAKE 1 CAPSULE BY MOUTH DAILY BEFORE BREAKFAST   naproxen sodium (ALEVE) 220 MG tablet Take 220 mg by mouth 2 (two) times daily as needed.   ondansetron (ZOFRAN-ODT) 4 MG disintegrating tablet DISSOLVE 1 TABLET BY MOUTH EVERY 8 HOURS AS NEEDED FOR NAUSEA (MIGRAINES)   Pramoxine-HC (HYDROCORTISONE ACE-PRAMOXINE) 2.5-1 % CREA Apply TID-QID   PREMARIN 0.9 MG tablet Take 0.9 mg by mouth daily.   pseudoephedrine (SUDAFED) 30 MG tablet Per box as needed   rizatriptan (MAXALT-MLT) 10 MG disintegrating tablet Dissolve 1 tablet by mouth as needed for migraine. May repeat in 2 hours if needed   sodium chloride (OCEAN) 0.65 % SOLN nasal spray Place 1  spray into both nostrils as needed for congestion.   triamcinolone (KENALOG) 0.1 % Apply topically 2 (two) times daily.   triamcinolone cream (KENALOG) 0.1 % 1 application     Allergies:   Oxaprozin   Social History   Socioeconomic History   Marital status: Divorced    Spouse name: Not on file   Number of children: Not on file   Years of education: Not on file   Highest education level: Not on file  Occupational History   Occupation: RN  Tobacco Use   Smoking status: Never   Smokeless tobacco: Never  Vaping Use   Vaping Use: Never used  Substance  and Sexual Activity   Alcohol use: Yes    Comment: occ   Drug use: No   Sexual activity: Not on file  Other Topics Concern   Not on file  Social History Narrative   Exercise: on average 3/week - running, weights sometimes   Social Determinants of Health   Financial Resource Strain: Not on file  Food Insecurity: Not on file  Transportation Needs: Not on file  Physical Activity: Not on file  Stress: Not on file  Social Connections: Not on file     Family History: The patient's family history includes Atrial fibrillation in her father; Breast cancer in her maternal aunt, maternal grandmother, and paternal grandmother; Coronary artery disease (age of onset: 36) in her father; Dementia in her mother; Diabetes type II in her father; Hypertension in her brother, father, and mother.  ROS:   Review of Systems  Constitution: Negative for decreased appetite, fever and weight gain.  HENT: Negative for congestion, ear discharge, hoarse voice and sore throat.   Eyes: Negative for discharge, redness, vision loss in right eye and visual halos.  Cardiovascular: Reports palpitations and shortness of breath.  Negative for chest pain, leg swelling, orthopnea and palpitations.  Respiratory: Negative for cough, hemoptysis, shortness of breath and snoring.   Endocrine: Negative for heat intolerance and polyphagia.  Hematologic/Lymphatic: Negative for bleeding problem. Does not bruise/bleed easily.  Skin: Negative for flushing, nail changes, rash and suspicious lesions.  Musculoskeletal: Negative for arthritis, joint pain, muscle cramps, myalgias, neck pain and stiffness.  Gastrointestinal: Negative for abdominal pain, bowel incontinence, diarrhea and excessive appetite.  Genitourinary: Negative for decreased libido, genital sores and incomplete emptying.  Neurological: Negative for brief paralysis, focal weakness, headaches and loss of balance.  Psychiatric/Behavioral: Negative for altered mental  status, depression and suicidal ideas.  Allergic/Immunologic: Negative for HIV exposure and persistent infections.    EKGs/Labs/Other Studies Reviewed:    The following studies were reviewed today:   EKG:  The ekg ordered today demonstrates sinus rhythm, heart rate 76 bpm with arrhythmia.  Zio monitor September 19,2022 Patient had a minimum heart rate of 38 bpm, maximum heart rate of 152 bpm, and average heart rate of 78 bpm. Predominant underlying rhythm was sinus rhythm. Two runs of supraventricular tachycardia occurred lasting 12 seconds at longest with a max rate of 144 bpm at fastest. Isolated PACs were rare (<1.0%). Isolated PVCs were rare (<1.0%). Mobitz I heart block present. Triggered and diary events associated with sinus rhythm, sinus tachycardia, and PACs.  Asymptomatic short runs of SVT.  Recent Labs: 06/29/2021: ALT 11; BUN 8; Creatinine, Ser 0.84; Hemoglobin 13.6; Platelets 252.0; Potassium 4.6; Sodium 138; TSH 1.75  Recent Lipid Panel    Component Value Date/Time   CHOL 213 (H) 06/29/2021 1116   TRIG 84.0 06/29/2021 1116   HDL 77.20 06/29/2021  1116   CHOLHDL 3 06/29/2021 1116   VLDL 16.8 06/29/2021 1116   LDLCALC 119 (H) 06/29/2021 1116    Physical Exam:    VS:  BP 108/65   Pulse 76   Ht 5\' 4"  (1.626 m)   Wt 131 lb 3.2 oz (59.5 kg)   SpO2 98%   BMI 22.52 kg/m     Wt Readings from Last 3 Encounters:  08/15/21 131 lb 3.2 oz (59.5 kg)  08/09/21 133 lb 9.6 oz (60.6 kg)  06/29/21 130 lb 6.4 oz (59.1 kg)     GEN: Well nourished, well developed in no acute distress HEENT: Normal NECK: No JVD; No carotid bruits LYMPHATICS: No lymphadenopathy CARDIAC: S1S2 noted,RRR, no murmurs, rubs, gallops RESPIRATORY:  Clear to auscultation without rales, wheezing or rhonchi  ABDOMEN: Soft, non-tender, non-distended, +bowel sounds, no guarding. EXTREMITIES: No edema, No cyanosis, no clubbing MUSCULOSKELETAL:  No deformity  SKIN: Warm and dry NEUROLOGIC:  Alert and  oriented x 3, non-focal PSYCHIATRIC:  Normal affect, good insight  ASSESSMENT:    1. SOB (shortness of breath)   2. Palpitations    PLAN:    I was able ossible to review her monitor which her symptoms are mostly associated with sinus tachycardia.  Which makes me wonder if appropriate sinus tachycardia is really playing a role here or postural orthostatic tachycardia syndrome.  There are a few questionable beats concerning for supraventricular tachycardia.  For now shared decision with the patient she would get a ambulatory monitor (cardia mobile) and get EKGs during the time she is symptomatic.  I would like [place] to check her blood Pressure as her blood pressures on the lower side and any rate control agents will affect.  With her shortness of breath for completeness we will get an echocardiogram to assess for LV function and any other structural abnormalities.  Follow-up as scheduled.  Patient Instructions  Medication Instructions:  Your physician recommends that you continue on your current medications as directed. Please refer to the Current Medication list given to you today.  *If you need a refill on your cardiac medications before your next appointment, please call your pharmacy*   Lab Work: None If you have labs (blood work) drawn today and your tests are completely normal, you will receive your results only by: [place] (if you have MyChart) OR A paper copy in the mail If you have any lab test that is abnormal or we need to change your treatment, we will call you to review the results.   Testing/Procedures: Your physician has requested that you have an echocardiography is a painless test that uses sound waves to create images of your heart. It provides your doctor with information about the size and shape of your heart and how well your heart's chambers and valves are working. This procedure takes approximately one hour. There are no restrictions for this  procedure.    Follow-Up: At [facility], you and your health needs are our priority.  As part of our continuing mission to provide you with exceptional heart care, we have created designated Provider Care Teams.  These Care Teams include your primary Cardiologist (physician) and Advanced Practice Providers (APPs -  Physician Assistants and Nurse Practitioners) who all work together to provide you with the care you need, when you need it.  We recommend signing up for the patient portal called "MyChart".  Sign up information is provided on this After Visit Summary.  MyChart is used to connect with patients  for Virtual Visits (Telemedicine).  Patients are able to view lab/test results, encounter notes, upcoming appointments, etc.  Non-urgent messages can be sent to your provider as well.   To learn more about what you can do with MyChart, go to [email or web address].    Your next appointment:   6 month(s)  The format for your next appointment:   In Person  Provider:   Berniece Salines, DO [address], [place], [place] [address]    Other Instructions  KardiaMobile [email or web address]        FDA-cleared, clinical grade mobile EKG monitor: Jodelle Red is the most clinically-validated mobile EKG used by the world's leading cardiac care medical professionals With Basic service, know instantly if your heart rhythm is normal or if atrial fibrillation is detected, and email the last single EKG recording to yourself or your doctor Premium service, available for purchase through the [employer]ia app for $9.99 per month or $99 per year, includes unlimited history and storage of your EKG recordings, a monthly EKG summary report to share with your doctor, along with the ability to track your blood pressure, activity and weight Includes one KardiaMobile phone clip FREE SHIPPING: Standard delivery 1-3 business days. Orders placed by 11:00am PST will ship that afternoon.  Otherwise, will ship next business day. All orders ship via [employer] from [place], [place] - sending an EKG [employer] and set up profile. Run EKG - by placing 1-2 fingers on the silver plates After EKG is complete - Download PDF  - Skip password (if you apply a password the provider will need it to view the EKG) Click share button (square with upward arrow) in bottom left corner To send: choose MyChart (first time log into MyChart)  Pop up window about sending ECG Click continue Choose type of message Choose provider Type subject and message Click send (EKG should be attached)  - To send additional EKGs in one message click the paperclip image and bottom of page to attach.     Dispo:  No follow-ups on file.   Medication Adjustments/Labs and Tests Ordered: Current medicines are reviewed at length with the patient today.  Concerns regarding medicines are outlined above.  Tests Ordered: Orders Placed This Encounter  Procedures   EKG 12-Lead   ECHOCARDIOGRAM COMPLETE   ECHOCARDIOGRAM COMPLETE   Medication Changes: No orders of the defined types were placed in this encounter.   The patient is in agreement with the above plan. The patient left the office in stable condition.  The patient will follow up in   Medication Adjustments/Labs and Tests Ordered: Current medicines are reviewed at length with the patient today.  Concerns regarding medicines are outlined above.  Orders Placed This Encounter  Procedures   EKG 12-Lead   ECHOCARDIOGRAM COMPLETE   ECHOCARDIOGRAM COMPLETE    No orders of the defined types were placed in this encounter.   Patient Instructions  Medication Instructions:  Your physician recommends that you continue on your current medications as directed. Please refer to the Current Medication list given to you today.  *If you need a refill on your cardiac medications before your next appointment, please call your pharmacy*   Lab  Work: None If you have labs (blood work) drawn today and your tests are completely normal, you will receive your results only by: [place] (if you have MyChart) OR A paper copy in the mail If you have any lab test that is abnormal or we need to  change your treatment, we will call you to review the results.   Testing/Procedures: Your physician has requested that you have an echocardiography is a painless test that uses sound waves to create images of your heart. It provides your doctor with information about the size and shape of your heart and how well your heart's chambers and valves are working. This procedure takes approximately one hour. There are no restrictions for this procedure.    Follow-Up: At [facility], you and your health needs are our priority.  As part of our continuing mission to provide you with exceptional heart care, we have created designated Provider Care Teams.  These Care Teams include your primary Cardiologist (physician) and Advanced Practice Providers (APPs -  Physician Assistants and Nurse Practitioners) who all work together to provide you with the care you need, when you need it.  We recommend signing up for the patient portal called "MyChart".  Sign up information is provided on this After Visit Summary.  MyChart is used to connect with patients for Virtual Visits (Telemedicine).  Patients are able to view lab/test results, encounter notes, upcoming appointments, etc.  Non-urgent messages can be sent to your provider as well.   To learn more about what you can do with MyChart, go to [email or web address].    Your next appointment:   6 month(s)  The format for your next appointment:   In Person  Provider:   Berniece Salines, DO [address], [place], [place] [address]    Other Instructions  KardiaMobile [email or web address]        FDA-cleared, clinical grade mobile EKG monitor: Jodelle Red is the most  clinically-validated mobile EKG used by the world's leading cardiac care medical professionals With Basic service, know instantly if your heart rhythm is normal or if atrial fibrillation is detected, and email the last single EKG recording to yourself or your doctor Premium service, available for purchase through the Kardia app for $9.99 per month or $99 per year, includes unlimited history and storage of your EKG recordings, a monthly EKG summary report to share with your doctor, along with the ability to track your blood pressure, activity and weight Includes one KardiaMobile phone clip FREE SHIPPING: Standard delivery 1-3 business days. Orders placed by 11:00am PST will ship that afternoon. Otherwise, will ship next business day. All orders ship via [employer] from [place], [place] - sending an EKG [employer] and set up profile. Run EKG - by placing 1-2 fingers on the silver plates After EKG is complete - Download PDF  - Skip password (if you apply a password the provider will need it to view the EKG) Click share button (square with upward arrow) in bottom left corner To send: choose MyChart (first time log into MyChart)  Pop up window about sending ECG Click continue Choose type of message Choose provider Type subject and message Click send (EKG should be attached)  - To send additional EKGs in one message click the paperclip image and bottom of page to attach.     Adopting a Healthy Lifestyle.  Know what a healthy weight is for you (roughly BMI <25) and aim to maintain this   Aim for 7+ servings of fruits and vegetables daily   65-80+ fluid ounces of water or unsweet tea for healthy kidneys   Limit to max 1 drink of alcohol per day; avoid smoking/tobacco   Limit animal fats in diet for cholesterol and heart health - choose grass fed  whenever available   Avoid highly processed foods, and foods high in saturated/trans fats   Aim for low stress - take time to  unwind and care for your mental health   Aim for 150 min of moderate intensity exercise weekly for heart health, and weights twice weekly for bone health   Aim for 7-9 hours of sleep daily   When it comes to diets, agreement about the perfect plan isnt easy to find, even among the experts. Experts at the [employer] developed an idea known as the Healthy Eating Plate. Just imagine a plate divided into logical, healthy portions.   The emphasis is on diet quality:   Load up on vegetables and fruits - one-half of your plate: Aim for color and variety, and remember that potatoes dont count.   Go for whole grains - one-quarter of your plate: Whole wheat, barley, wheat berries, quinoa, oats, brown rice, and foods made with them. If you want pasta, go with whole wheat pasta.   Protein power - one-quarter of your plate: Fish, chicken, beans, and nuts are all healthy, versatile protein sources. Limit red meat.   The diet, however, does go beyond the plate, offering a few other suggestions.   Use healthy plant oils, such as olive, canola, soy, corn, sunflower and peanut. Check the labels, and avoid partially hydrogenated oil, which have unhealthy trans fats.   If youre thirsty, drink water. Coffee and tea are good in moderation, but skip sugary drinks and limit milk and dairy products to one or two daily servings.   The type of carbohydrate in the diet is more important than the amount. Some sources of carbohydrates, such as vegetables, fruits, whole grains, and beans-are healthier than others.   Finally, stay active  Signed, Berniece Salines, DO  08/15/2021 4:08 PM    [facility]

## 2021-08-22 ENCOUNTER — Other Ambulatory Visit (HOSPITAL_COMMUNITY): Payer: Self-pay

## 2021-08-23 ENCOUNTER — Other Ambulatory Visit (HOSPITAL_COMMUNITY): Payer: Self-pay

## 2021-08-23 NOTE — Telephone Encounter (Signed)
Forwarding these for review.

## 2021-09-02 ENCOUNTER — Other Ambulatory Visit (HOSPITAL_COMMUNITY): Payer: Self-pay

## 2021-09-03 ENCOUNTER — Other Ambulatory Visit (HOSPITAL_COMMUNITY): Payer: Self-pay

## 2021-09-04 ENCOUNTER — Other Ambulatory Visit: Payer: Self-pay

## 2021-09-04 ENCOUNTER — Ambulatory Visit (HOSPITAL_COMMUNITY): Payer: 59 | Attending: Cardiovascular Disease

## 2021-09-04 DIAGNOSIS — R0602 Shortness of breath: Secondary | ICD-10-CM

## 2021-09-04 LAB — ECHOCARDIOGRAM COMPLETE
Area-P 1/2: 3.53 cm2
S' Lateral: 2.4 cm

## 2021-09-05 ENCOUNTER — Other Ambulatory Visit (HOSPITAL_COMMUNITY): Payer: Self-pay

## 2021-09-05 ENCOUNTER — Other Ambulatory Visit: Payer: Self-pay

## 2021-09-05 MED ORDER — PROPRANOLOL HCL 10 MG PO TABS
10.0000 mg | ORAL_TABLET | Freq: Two times a day (BID) | ORAL | 0 refills | Status: DC
  Filled 2021-09-05: qty 60, 30d supply, fill #0

## 2021-09-05 NOTE — Progress Notes (Signed)
Called pt to let her know Dr. Terrial Rhodes response. Pt is willing to try Propranolol 10 mg twice daily. A 30 day supply to trial the medication for her symptoms. Pt verbalized understanding, no further questions or concerns at this time.

## 2021-09-12 DIAGNOSIS — D1801 Hemangioma of skin and subcutaneous tissue: Secondary | ICD-10-CM | POA: Diagnosis not present

## 2021-09-12 DIAGNOSIS — L578 Other skin changes due to chronic exposure to nonionizing radiation: Secondary | ICD-10-CM | POA: Diagnosis not present

## 2021-09-12 DIAGNOSIS — L814 Other melanin hyperpigmentation: Secondary | ICD-10-CM | POA: Diagnosis not present

## 2021-09-12 DIAGNOSIS — Z23 Encounter for immunization: Secondary | ICD-10-CM | POA: Diagnosis not present

## 2021-09-18 ENCOUNTER — Other Ambulatory Visit (HOSPITAL_COMMUNITY): Payer: Self-pay

## 2021-09-18 DIAGNOSIS — Z113 Encounter for screening for infections with a predominantly sexual mode of transmission: Secondary | ICD-10-CM | POA: Diagnosis not present

## 2021-09-18 DIAGNOSIS — Z01419 Encounter for gynecological examination (general) (routine) without abnormal findings: Secondary | ICD-10-CM | POA: Diagnosis not present

## 2021-09-18 MED ORDER — OXYBUTYNIN CHLORIDE ER 5 MG PO TB24
ORAL_TABLET | ORAL | 4 refills | Status: AC
  Filled 2021-09-18: qty 90, 90d supply, fill #0
  Filled 2021-12-19: qty 90, 90d supply, fill #1
  Filled 2022-03-21: qty 90, 90d supply, fill #2
  Filled 2022-06-20: qty 90, 90d supply, fill #3

## 2021-09-18 MED ORDER — PREMARIN 0.625 MG PO TABS
0.6250 mg | ORAL_TABLET | Freq: Every day | ORAL | 4 refills | Status: DC
  Filled 2021-09-18: qty 90, 90d supply, fill #0
  Filled 2021-12-19: qty 90, 90d supply, fill #1
  Filled 2022-03-21: qty 90, 90d supply, fill #2
  Filled 2022-06-20: qty 90, 90d supply, fill #3

## 2021-09-18 MED ORDER — TINIDAZOLE 500 MG PO TABS
ORAL_TABLET | ORAL | 1 refills | Status: DC
  Filled 2021-09-18: qty 12, 3d supply, fill #0
  Filled 2021-09-26: qty 12, 3d supply, fill #1

## 2021-09-19 ENCOUNTER — Other Ambulatory Visit (HOSPITAL_COMMUNITY): Payer: Self-pay

## 2021-09-24 ENCOUNTER — Telehealth: Payer: 59 | Admitting: Physician Assistant

## 2021-09-24 ENCOUNTER — Encounter: Payer: Self-pay | Admitting: Cardiology

## 2021-09-24 ENCOUNTER — Other Ambulatory Visit (HOSPITAL_COMMUNITY): Payer: Self-pay

## 2021-09-24 ENCOUNTER — Other Ambulatory Visit: Payer: Self-pay

## 2021-09-24 DIAGNOSIS — B9789 Other viral agents as the cause of diseases classified elsewhere: Secondary | ICD-10-CM | POA: Diagnosis not present

## 2021-09-24 DIAGNOSIS — J019 Acute sinusitis, unspecified: Secondary | ICD-10-CM

## 2021-09-24 MED ORDER — PROPRANOLOL HCL 10 MG PO TABS
10.0000 mg | ORAL_TABLET | Freq: Two times a day (BID) | ORAL | 3 refills | Status: DC
  Filled 2021-09-24: qty 180, 90d supply, fill #0

## 2021-09-24 NOTE — Telephone Encounter (Signed)
Refill sent to pharmacy.   

## 2021-09-25 ENCOUNTER — Other Ambulatory Visit (HOSPITAL_COMMUNITY): Payer: Self-pay

## 2021-09-25 MED ORDER — AZELASTINE HCL 0.1 % NA SOLN
1.0000 | Freq: Two times a day (BID) | NASAL | 0 refills | Status: DC
  Filled 2021-09-25: qty 30, 50d supply, fill #0

## 2021-09-25 NOTE — Progress Notes (Signed)
I have spent 5 minutes in review of e-visit questionnaire, review and updating patient chart, medical decision making and response to patient.   Asser Lucena Cody Delmont Prosch, PA-C    

## 2021-09-25 NOTE — Progress Notes (Signed)
E-Visit for Sinus Problems  We are sorry that you are not feeling well.  Here is how we plan to help!  Based on what you have shared with me it looks like you have sinusitis.  Sinusitis is inflammation and infection in the sinus cavities of the head.  Based on your presentation I believe you most likely have Acute Viral Sinusitis.This is an infection most likely caused by a virus. There is not specific treatment for viral sinusitis other than to help you with the symptoms until the infection runs its course.  You may use an oral decongestant such as Mucinex D or if you have glaucoma or high blood pressure use plain Mucinex. Saline nasal spray help and can safely be used as often as needed for congestion, I have prescribed: Azelastine nasal spray 2 sprays in each nostril twice a day. Take this along with your Flonase nasal spray.   Some authorities believe that zinc sprays or the use of Echinacea may shorten the course of your symptoms.  Sinus infections are not as easily transmitted as other respiratory infection, however we still recommend that you avoid close contact with loved ones, especially the very young and elderly.  Remember to wash your hands thoroughly throughout the day as this is the number one way to prevent the spread of infection!  Home Care: Only take medications as instructed by your medical team. Do not take these medications with alcohol. A steam or ultrasonic humidifier can help congestion.  You can place a towel over your head and breathe in the steam from hot water coming from a faucet. Avoid close contacts especially the very young and the elderly. Cover your mouth when you cough or sneeze. Always remember to wash your hands.  Get Help Right Away If: You develop worsening fever or sinus pain. You develop a severe head ache or visual changes. Your symptoms persist after you have completed your treatment plan.  Make sure you Understand these instructions. Will watch  your condition. Will get help right away if you are not doing well or get worse.   Thank you for choosing an e-visit.  Your e-visit answers were reviewed by a board certified advanced clinical practitioner to complete your personal care plan. Depending upon the condition, your plan could have included both over the counter or prescription medications.  Please review your pharmacy choice. Make sure the pharmacy is open so you can pick up prescription now. If there is a problem, you may contact your provider through [employer] and have the prescription routed to another pharmacy.  Your safety is important to [place]. If you have drug allergies check your prescription carefully.   For the next 24 hours you can use MyChart to ask questions about today's visit, request a non-urgent call back, or ask for a work or school excuse. You will get an email in the next two days asking about your experience. I hope that your e-visit has been valuable and will speed your recovery.

## 2021-09-26 ENCOUNTER — Other Ambulatory Visit (HOSPITAL_COMMUNITY): Payer: Self-pay

## 2021-09-29 ENCOUNTER — Other Ambulatory Visit (HOSPITAL_COMMUNITY): Payer: Self-pay

## 2021-10-23 ENCOUNTER — Encounter: Payer: Self-pay | Admitting: Internal Medicine

## 2021-10-31 ENCOUNTER — Other Ambulatory Visit (HOSPITAL_COMMUNITY): Payer: Self-pay

## 2021-11-23 ENCOUNTER — Ambulatory Visit: Payer: 59 | Admitting: Internal Medicine

## 2021-11-28 ENCOUNTER — Other Ambulatory Visit (HOSPITAL_COMMUNITY): Payer: Self-pay

## 2021-11-29 ENCOUNTER — Encounter: Payer: Self-pay | Admitting: Cardiology

## 2021-11-30 ENCOUNTER — Other Ambulatory Visit: Payer: Self-pay

## 2021-11-30 MED ORDER — PROPRANOLOL HCL 10 MG PO TABS: 10.0000 mg | ORAL_TABLET | Freq: Every evening | ORAL | 3 refills | Status: DC

## 2021-11-30 NOTE — Progress Notes (Signed)
Medication list updated.

## 2021-12-07 ENCOUNTER — Other Ambulatory Visit (HOSPITAL_COMMUNITY): Payer: Self-pay

## 2021-12-07 DIAGNOSIS — H2513 Age-related nuclear cataract, bilateral: Secondary | ICD-10-CM | POA: Diagnosis not present

## 2021-12-07 DIAGNOSIS — H00022 Hordeolum internum right lower eyelid: Secondary | ICD-10-CM | POA: Diagnosis not present

## 2021-12-07 MED ORDER — DOXYCYCLINE HYCLATE 100 MG PO CAPS
100.0000 mg | ORAL_CAPSULE | Freq: Two times a day (BID) | ORAL | 0 refills | Status: DC
  Filled 2021-12-07: qty 28, 14d supply, fill #0

## 2021-12-07 MED ORDER — NEOMYCIN-POLYMYXIN-DEXAMETH 3.5-10000-0.1 OP SUSP
OPHTHALMIC | 0 refills | Status: DC
  Filled 2021-12-07: qty 5, 14d supply, fill #0

## 2021-12-14 ENCOUNTER — Other Ambulatory Visit (HOSPITAL_COMMUNITY): Payer: Self-pay

## 2021-12-14 MED ORDER — AMOXICILLIN-POT CLAVULANATE 500-125 MG PO TABS
1.0000 | ORAL_TABLET | Freq: Two times a day (BID) | ORAL | 0 refills | Status: DC
  Filled 2021-12-14: qty 14, 7d supply, fill #0

## 2021-12-14 MED ORDER — ERYTHROMYCIN 5 MG/GM OP OINT
TOPICAL_OINTMENT | OPHTHALMIC | 0 refills | Status: DC
  Filled 2021-12-14: qty 3.5, 7d supply, fill #0

## 2021-12-19 ENCOUNTER — Other Ambulatory Visit (HOSPITAL_COMMUNITY): Payer: Self-pay

## 2021-12-20 ENCOUNTER — Other Ambulatory Visit (HOSPITAL_COMMUNITY): Payer: Self-pay

## 2021-12-24 ENCOUNTER — Other Ambulatory Visit (HOSPITAL_COMMUNITY): Payer: Self-pay

## 2022-01-23 ENCOUNTER — Other Ambulatory Visit (HOSPITAL_BASED_OUTPATIENT_CLINIC_OR_DEPARTMENT_OTHER): Payer: Self-pay | Admitting: Obstetrics and Gynecology

## 2022-01-23 DIAGNOSIS — Z1231 Encounter for screening mammogram for malignant neoplasm of breast: Secondary | ICD-10-CM

## 2022-01-24 ENCOUNTER — Other Ambulatory Visit (HOSPITAL_COMMUNITY): Payer: Self-pay

## 2022-01-25 ENCOUNTER — Ambulatory Visit (HOSPITAL_BASED_OUTPATIENT_CLINIC_OR_DEPARTMENT_OTHER)
Admission: RE | Admit: 2022-01-25 | Discharge: 2022-01-25 | Disposition: A | Discharge status: Home / Self Care | Payer: 59 | Source: Ambulatory Visit | Attending: Obstetrics and Gynecology | Admitting: Obstetrics and Gynecology

## 2022-01-25 ENCOUNTER — Encounter (HOSPITAL_BASED_OUTPATIENT_CLINIC_OR_DEPARTMENT_OTHER): Payer: Self-pay | Admitting: Radiology

## 2022-01-25 DIAGNOSIS — Z1231 Encounter for screening mammogram for malignant neoplasm of breast: Secondary | ICD-10-CM | POA: Insufficient documentation

## 2022-01-29 ENCOUNTER — Other Ambulatory Visit: Payer: Self-pay | Admitting: Obstetrics and Gynecology

## 2022-01-29 DIAGNOSIS — R928 Other abnormal and inconclusive findings on diagnostic imaging of breast: Secondary | ICD-10-CM

## 2022-01-30 ENCOUNTER — Other Ambulatory Visit (HOSPITAL_COMMUNITY): Payer: Self-pay

## 2022-01-30 DIAGNOSIS — H00022 Hordeolum internum right lower eyelid: Secondary | ICD-10-CM | POA: Diagnosis not present

## 2022-01-30 MED ORDER — AMOXICILLIN-POT CLAVULANATE 500-125 MG PO TABS
1.0000 | ORAL_TABLET | Freq: Three times a day (TID) | ORAL | 0 refills | Status: DC
  Filled 2022-01-30: qty 90, 30d supply, fill #0

## 2022-01-31 ENCOUNTER — Other Ambulatory Visit (HOSPITAL_COMMUNITY): Payer: Self-pay

## 2022-02-01 ENCOUNTER — Other Ambulatory Visit (HOSPITAL_COMMUNITY): Payer: Self-pay

## 2022-02-06 DIAGNOSIS — H00022 Hordeolum internum right lower eyelid: Secondary | ICD-10-CM | POA: Diagnosis not present

## 2022-02-14 ENCOUNTER — Ambulatory Visit
Admission: RE | Admit: 2022-02-14 | Discharge: 2022-02-14 | Disposition: A | Discharge status: Home / Self Care | Payer: 59 | Source: Ambulatory Visit | Attending: Obstetrics and Gynecology | Admitting: Obstetrics and Gynecology

## 2022-02-14 ENCOUNTER — Ambulatory Visit: Payer: 59

## 2022-02-14 DIAGNOSIS — R922 Inconclusive mammogram: Secondary | ICD-10-CM | POA: Diagnosis not present

## 2022-02-14 DIAGNOSIS — R928 Other abnormal and inconclusive findings on diagnostic imaging of breast: Secondary | ICD-10-CM

## 2022-02-14 NOTE — Progress Notes (Signed)
? ? ?Subjective:  ? ? Patient ID: Mindy [name], female    DOB: 20-Dec-1960, 61 y.o.   MRN: [id number] ? ?This visit occurred during the SARS-CoV-2 public health emergency.  Safety protocols were in place, including screening questions prior to the visit, additional usage of staff PPE, and extensive cleaning of exam room while observing appropriate contact time as indicated for disinfecting solutions. ? ? ? ?HPI ?Mindy [name] is here for  ?Chief Complaint  ?Patient presents with  ? elbow pain  ? ? ? ?Right elbow pain - the past few months her right elbow has been painful.  It hurts to reach over and pick up her phone.  It hurts in the lateral posterior side of her elbow.  Aleve has helped.  It really bothers her picking up something heavier.  She denies weakness - it just hurts.  There is no radiation of the pain.  No swelling. ? ?Left elbow pain- knot on the olecranon that hurts, esp with pressure on it.  Two summers ago fell and landed on her left arm when it was straight - injured her shoulder.  She is unsure if it is related to the fall or not. ? ?She does have periodic GERD-Dexilant is no longer covered by insurance. ? ?Medications and allergies reviewed with patient and updated if appropriate. ? ?Current Outpatient Medications on File Prior to Visit  ?Medication Sig Dispense Refill  ? albuterol (VENTOLIN HFA) 108 (90 Base) MCG/ACT inhaler INHALE 2 PUFFS INTO THE LUNGS EVERY 4 HOURS AS NEEDED FOR WHEEZING OR SHORTNESS OF BREATH 18 g 3  ? azelastine (ASTELIN) 0.1 % nasal spray Spray 1 spray into each nostril 2 (two) times daily as directed. 30 mL 0  ? butalbital-acetaminophen-caffeine (FIORICET) 50-325-40 MG tablet TAKE 1 TABLET BY MOUTH EVERY 6 HOURS AS NEEDED FOR HEADACHE 75 tablet 5  ? cetirizine (ZYRTEC) 10 MG tablet 1 tablet    ? estrogens, conjugated, (PREMARIN) 0.625 MG tablet Take 1 tablet (0.625 mg total) by mouth daily. 90 tablet 4  ? fluticasone (FLONASE) 50 MCG/ACT nasal spray PLACE 2 SPRAYS INTO BOTH  NOSTRILS DAILY. 16 g 6  ? hydrocortisone 2.5 % cream 1 application to affected area    ? naproxen sodium (ALEVE) 220 MG tablet Take 220 mg by mouth 2 (two) times daily as needed.    ? ondansetron (ZOFRAN-ODT) 4 MG disintegrating tablet DISSOLVE 1 TABLET BY MOUTH EVERY 8 HOURS AS NEEDED FOR NAUSEA (MIGRAINES) 20 tablet 3  ? oxybutynin (DITROPAN-XL) 5 MG 24 hr tablet Take 1 tablet by mouth daily 90 tablet 4  ? Pramoxine-HC (HYDROCORTISONE ACE-PRAMOXINE) 2.5-1 % CREA Apply TID-QID 57 g 2  ? propranolol (INDERAL) 10 MG tablet Take 1 tablet (10 mg total) by mouth at bedtime. 90 tablet 3  ? pseudoephedrine (SUDAFED) 30 MG tablet Per box as needed    ? rizatriptan (MAXALT-MLT) 10 MG disintegrating tablet Dissolve 1 tablet by mouth as needed for migraine. May repeat in 2 hours if needed 10 tablet 11  ? sodium chloride (OCEAN) 0.65 % SOLN nasal spray Place 1 spray into both nostrils as needed for congestion. 50 mL 0  ? tinidazole (TINDAMAX) 500 MG tablet Take 4 tablets by mouth every day for 3 days. 12 tablet 1  ? triamcinolone cream (KENALOG) 0.1 % 1 application    ? linaclotide (LINZESS) 72 MCG capsule TAKE 1 CAPSULE BY MOUTH DAILY BEFORE BREAKFAST 90 capsule 1  ? ?No current facility-administered medications on file prior to visit.  ? ? ?  Review of Systems ? ?   ?Objective:  ? ?Vitals:  ? 02/15/22 1355  ?BP: 110/66  ?Pulse: 67  ?Temp: 98.2 ?F (36.8 ?C)  ?SpO2: 96%  ? ?BP Readings from Last 3 Encounters:  ?02/15/22 110/66  ?08/15/21 108/65  ?08/09/21 114/62  ? ?Wt Readings from Last 3 Encounters:  ?02/15/22 136 lb (61.7 kg)  ?08/15/21 131 lb 3.2 oz (59.5 kg)  ?08/09/21 133 lb 9.6 oz (60.6 kg)  ? ?Body mass index is 23.34 kg/m?. ? ?  ?Physical Exam ?Constitutional:   ?   Appearance: Normal appearance.  ?HENT:  ?   Head: Normocephalic.  ?Musculoskeletal:  ?   Comments: Right elbow with tenderness over lateral epicondyle.  Full range of motion.  No swelling.  No tenderness on olecranon or medial epicondyle.  No upper arm or  forearm pain ? ?Left elbow-BB sized lump that is subcutaneous and tender to palpation.  No olecranon pain or swelling.  Full range of motion of elbow without pain.  ?Skin: ?   General: Skin is warm and dry.  ?   Findings: No erythema.  ?Neurological:  ?   Mental Status: She is alert.  ? ?   ? ? ? ? ? ?Assessment & Plan:  ? ? ?See Problem List for Assessment and Plan of chronic medical problems.  ? ? ? ? ?

## 2022-02-15 ENCOUNTER — Ambulatory Visit: Payer: 59 | Admitting: Internal Medicine

## 2022-02-15 ENCOUNTER — Encounter: Payer: Self-pay | Admitting: Internal Medicine

## 2022-02-15 ENCOUNTER — Ambulatory Visit (INDEPENDENT_AMBULATORY_CARE_PROVIDER_SITE_OTHER): Payer: 59

## 2022-02-15 ENCOUNTER — Other Ambulatory Visit (HOSPITAL_COMMUNITY): Payer: Self-pay

## 2022-02-15 VITALS — BP 110/66 | HR 67 | Temp 98.2°F | Ht 64.0 in | Wt 136.0 lb

## 2022-02-15 DIAGNOSIS — M7711 Lateral epicondylitis, right elbow: Secondary | ICD-10-CM | POA: Diagnosis not present

## 2022-02-15 DIAGNOSIS — K219 Gastro-esophageal reflux disease without esophagitis: Secondary | ICD-10-CM

## 2022-02-15 DIAGNOSIS — M25522 Pain in left elbow: Secondary | ICD-10-CM | POA: Diagnosis not present

## 2022-02-15 MED ORDER — CELECOXIB 100 MG PO CAPS
100.0000 mg | ORAL_CAPSULE | Freq: Two times a day (BID) | ORAL | 5 refills | Status: DC
Start: 2022-02-15 — End: 2023-10-21
  Filled 2022-02-15: qty 60, 30d supply, fill #0
  Filled 2022-05-21: qty 60, 30d supply, fill #1
  Filled 2022-06-20: qty 60, 30d supply, fill #2

## 2022-02-15 MED ORDER — ESOMEPRAZOLE MAGNESIUM 40 MG PO CPDR
40.0000 mg | DELAYED_RELEASE_CAPSULE | Freq: Every day | ORAL | 3 refills | Status: DC
Start: 2022-02-15 — End: 2023-10-21
  Filled 2022-02-15: qty 90, 90d supply, fill #0
  Filled 2022-05-21: qty 90, 90d supply, fill #1

## 2022-02-15 NOTE — Assessment & Plan Note (Signed)
Acute ?Symptoms consistent with tennis elbow ?Advised revising activities, rest, NSAIDs, ice ?We will prescribe Celebrex 100 mg twice daily as needed since other NSAIDs do upset her stomach ?If no improvement consider seeing sports medicine ?

## 2022-02-15 NOTE — Patient Instructions (Addendum)
? ? ? ?An xray was ordered.   ? ? ?Medications changes include :   nexium 40 mg daily as needed.   Celebrex 100 mg twice daily as needed ? ? ?Your prescription(s) have been sent to your pharmacy.  ? ? ?Return if symptoms worsen or fail to improve. ? ? ?Tennis Elbow ? ?Tennis elbow (lateral epicondylitis) is inflammation of tendons in your outer forearm, near your elbow. Tendons are tissues that connect muscle to bone. When you have tennis elbow, inflammation affects the tendons that you use to bend your wrist and move your hand up. Inflammation occurs in the lower part of the upper arm bone (humerus), where the tendons connect to the bone (lateral epicondyle). ?Tennis elbow often affects people who play tennis, but anyone may get the condition from repeatedly extending the wrist or turning the forearm. ?What are the causes? ?This condition is usually caused by repeatedly extending the wrist, turning the forearm, and using the hands. It can result from sports or work that requires repetitive forearm movements. In some cases, it may be caused by a sudden injury. ?What increases the risk? ?You are more likely to develop tennis elbow if you play tennis or another racket sport. You also have a higher risk if you frequently use your hands for work. Besides people who play tennis, others at greater risk include: ?People who use computers. ?Architect workers. ?People who work in [employer]. ?Musicians. ?Cooks. ?Cashiers. ?What are the signs or symptoms? ?Symptoms of this condition include: ?Pain and tenderness in the forearm and the outer part of the elbow. Pain may be felt only when using the arm, or it may be there all the time. ?A burning feeling that starts in the elbow and spreads down the forearm. ?A weak grip in the hand. ?How is this diagnosed? ?This condition is diagnosed based on your symptoms, your medical history, and a physical exam. ?You may also have X-rays or an MRI to: ?Confirm the diagnosis. ?Look for  other issues. ?Check for tears in the ligaments, muscles, or tendons. ?How is this treated? ?Resting and icing your arm is often the first treatment. Your health care provider may also recommend: ?Medicines to reduce pain and inflammation. These may be in the form of a pill, topical gels, or shots of a steroid medicine (cortisone). ?An elbow strap to reduce stress on the area. ?Physical therapy. This may include massage or exercises or both. ?An elbow brace to restrict the movements that cause symptoms. ?If these treatments do not help relieve your symptoms, your health care provider may recommend surgery to remove damaged muscle and reattach healthy muscle to bone. ?Follow these instructions at home: ?If you have a brace or strap: ?Wear the brace or strap as told by your health care provider. Remove it only as told by your health care provider. ?Check the skin around the brace or strap every day. Tell your health care provider about any concerns. ?Loosen the brace if your fingers tingle, become numb, or turn cold and blue. ?Keep the brace clean. ?If the brace or strap is not waterproof: ?Do not let it get wet. ?Cover it with a watertight covering when you take a bath or a shower. ?Managing pain, stiffness, and swelling ? ?If directed, put ice on the injured area. To do this: ?If you have a removable brace or strap, remove it as told by your health care provider. ?Put ice in a plastic bag. ?Place a towel between your skin and the bag. ?  Leave the ice on for 20 minutes, 2-3 times a day. ?Remove the ice if your skin turns bright red. This is very important. If you cannot feel pain, heat, or cold, you have a greater risk of damage to the area. ?Move your fingers often to reduce stiffness and swelling. ?Activity ?Rest your elbow and wrist and avoid activities that cause symptoms as told by your health care provider. ?Do physical therapy exercises as told by your health care provider. ?If you lift an object, lift it with  your palm facing up. This reduces stress on your elbow. ?Lifestyle ?If your tennis elbow is caused by sports, check your equipment and make sure that: ?You use it correctly. ?It is good match for you. ?If your tennis elbow is caused by work or computer use, take frequent breaks to stretch your arm. Talk with your employer about ways to manage your condition at work. ?General instructions ?Take over-the-counter and prescription medicines only as told by your health care provider. ?Do not use any products that contain nicotine or tobacco. These products include cigarettes, chewing tobacco, and vaping devices, such as e-cigarettes. If you need help quitting, ask your health care provider. ?Keep all follow-up visits. This is important. ?How is this prevented? ?Before and after activity: ?Warm up and stretch before being active. ?Cool down and stretch after being active. ?Give your body time to rest between periods of activity. ?During activity: ?Make sure to use equipment that fits you. ?If you play tennis, put power in your stroke with your lower body. Avoid using your arm only. ?Maintain physical fitness, including: ?Strength. ?Flexibility. ?Endurance. ?Do exercises to strengthen the forearm muscles. ?Contact a health care provider if: ?You have pain that gets worse or does not get better with treatment. ?You have numbness or weakness in your forearm, hand, or fingers. ?Get help right away if: ?Your pain is severe. ?You cannot move your wrist. ?Summary ?Tennis elbow (lateral epicondylitis) is inflammation of tendons in your outer forearm, near your elbow. ?Common symptoms include pain and tenderness in your forearm and the outer part of your elbow. ?This condition is usually caused by repeatedly extending your wrist, turning your forearm, and using your hands. ?The first treatment is often resting and icing your arm to relieve symptoms. Further treatment may include taking medicine, getting physical therapy, wearing a  brace or strap, or having surgery. ?This information is not intended to replace advice given to you by your health care provider. Make sure you discuss any questions you have with your health care provider. ?Document Revised: 04/18/2020 Document Reviewed: 04/18/2020 ?Elsevier Patient Education ? [place]. ? ?

## 2022-02-15 NOTE — Assessment & Plan Note (Signed)
Chronic ?Dexilant no longer covered by insurance so not taking anything on a regular basis ?Start Nexium 40 mg daily as needed ?

## 2022-02-15 NOTE — Assessment & Plan Note (Signed)
Subacute ?She has had this pain for a while-she has a small bump on her elbow that hurts with palpation or pressure on the elbow.  When the skin is moved it is obvious that the bump is subcutaneous and not related to her elbow joint ??  Foreign body versus bone chip ?We will get x-ray of the elbow today ?Consider removal by dermatology since it is causing pain ?

## 2022-02-19 ENCOUNTER — Other Ambulatory Visit (HOSPITAL_COMMUNITY): Payer: Self-pay

## 2022-02-19 ENCOUNTER — Encounter: Payer: Self-pay | Admitting: Internal Medicine

## 2022-02-19 ENCOUNTER — Other Ambulatory Visit: Payer: Self-pay | Admitting: Internal Medicine

## 2022-02-19 ENCOUNTER — Other Ambulatory Visit: Payer: Self-pay

## 2022-02-19 MED ORDER — LINACLOTIDE 72 MCG PO CAPS
ORAL_CAPSULE | ORAL | 1 refills | Status: DC
  Filled 2022-02-19: qty 90, 90d supply, fill #0
  Filled 2022-05-21: qty 90, 90d supply, fill #1

## 2022-02-19 NOTE — Progress Notes (Signed)
Mychart message sent by pt: ? ? ?Would you please send in a medication refill for the butalbital-acetaminophen-caffeine 50-325-40 MG tablet prescription? My prescription does not have any refills left. I use the [facility]. Please let me know if you have any questions. ?Thank you, ?Mindy [name]  ? ? ? ?Dr. Annamaria Boots, please advise if you are okay refilling med or if you want pt's PCP Dr.Burns to take over refilling med. ? ? ?Allergies  ?Allergen Reactions  ? Oxaprozin Hives and Itching  ?  "daypro"  ? ? ?Current Outpatient Medications:  ?  albuterol (VENTOLIN HFA) 108 (90 Base) MCG/ACT inhaler, INHALE 2 PUFFS INTO THE LUNGS EVERY 4 HOURS AS NEEDED FOR WHEEZING OR SHORTNESS OF BREATH, Disp: 18 g, Rfl: 3 ?  azelastine (ASTELIN) 0.1 % nasal spray, Spray 1 spray into each nostril 2 (two) times daily as directed., Disp: 30 mL, Rfl: 0 ?  butalbital-acetaminophen-caffeine (FIORICET) 50-325-40 MG tablet, TAKE 1 TABLET BY MOUTH EVERY 6 HOURS AS NEEDED FOR HEADACHE, Disp: 75 tablet, Rfl: 5 ?  celecoxib (CELEBREX) 100 MG capsule, Take 1 capsule by mouth 2 times daily., Disp: 60 capsule, Rfl: 5 ?  cetirizine (ZYRTEC) 10 MG tablet, 1 tablet, Disp: , Rfl:  ?  esomeprazole (NEXIUM) 40 MG capsule, Take 1 capsule by mouth daily., Disp: 90 capsule, Rfl: 3 ?  estrogens, conjugated, (PREMARIN) 0.625 MG tablet, Take 1 tablet (0.625 mg total) by mouth daily., Disp: 90 tablet, Rfl: 4 ?  fluticasone (FLONASE) 50 MCG/ACT nasal spray, PLACE 2 SPRAYS INTO BOTH NOSTRILS DAILY., Disp: 16 g, Rfl: 6 ?  hydrocortisone 2.5 % cream, 1 application to affected area, Disp: , Rfl:  ?  linaclotide (LINZESS) 72 MCG capsule, TAKE 1 CAPSULE BY MOUTH DAILY BEFORE BREAKFAST, Disp: 90 capsule, Rfl: 1 ?  naproxen sodium (ALEVE) 220 MG tablet, Take 220 mg by mouth 2 (two) times daily as needed., Disp: , Rfl:  ?  ondansetron (ZOFRAN-ODT) 4 MG disintegrating tablet, DISSOLVE 1 TABLET BY MOUTH EVERY 8 HOURS AS NEEDED FOR NAUSEA (MIGRAINES), Disp:  20 tablet, Rfl: 3 ?  oxybutynin (DITROPAN-XL) 5 MG 24 hr tablet, Take 1 tablet by mouth daily, Disp: 90 tablet, Rfl: 4 ?  Pramoxine-HC (HYDROCORTISONE ACE-PRAMOXINE) 2.5-1 % CREA, Apply TID-QID, Disp: 57 g, Rfl: 2 ?  propranolol (INDERAL) 10 MG tablet, Take 1 tablet (10 mg total) by mouth at bedtime., Disp: 90 tablet, Rfl: 3 ?  pseudoephedrine (SUDAFED) 30 MG tablet, Per box as needed, Disp: , Rfl:  ?  rizatriptan (MAXALT-MLT) 10 MG disintegrating tablet, Dissolve 1 tablet by mouth as needed for migraine. May repeat in 2 hours if needed, Disp: 10 tablet, Rfl: 11 ?  sodium chloride (OCEAN) 0.65 % SOLN nasal spray, Place 1 spray into both nostrils as needed for congestion., Disp: 50 mL, Rfl: 0 ?  tinidazole (TINDAMAX) 500 MG tablet, Take 4 tablets by mouth every day for 3 days., Disp: 12 tablet, Rfl: 1 ?  triamcinolone cream (KENALOG) 0.1 %, 1 application, Disp: , Rfl:  ? ?

## 2022-02-20 ENCOUNTER — Other Ambulatory Visit (HOSPITAL_COMMUNITY): Payer: Self-pay

## 2022-02-20 MED ORDER — BUTALBITAL-APAP-CAFFEINE 50-325-40 MG PO TABS
ORAL_TABLET | ORAL | 5 refills | Status: DC
  Filled 2022-02-20: qty 75, 18d supply, fill #0
  Filled 2022-03-21: qty 75, 18d supply, fill #1
  Filled 2022-04-21: qty 75, 18d supply, fill #2
  Filled 2022-05-21: qty 75, 18d supply, fill #3
  Filled 2022-06-20: qty 75, 18d supply, fill #4
  Filled 2022-07-17: qty 75, 18d supply, fill #5

## 2022-02-20 NOTE — Telephone Encounter (Signed)
Dr. Annamaria Boots, please advise on refill request if you are okay refilling med or if you want this to be handled by PCP. ? ? ?Allergies  ?Allergen Reactions  ? Oxaprozin Hives and Itching  ?  "daypro"  ? ? ? ?Current Outpatient Medications:  ?  albuterol (VENTOLIN HFA) 108 (90 Base) MCG/ACT inhaler, INHALE 2 PUFFS INTO THE LUNGS EVERY 4 HOURS AS NEEDED FOR WHEEZING OR SHORTNESS OF BREATH, Disp: 18 g, Rfl: 3 ?  azelastine (ASTELIN) 0.1 % nasal spray, Spray 1 spray into each nostril 2 (two) times daily as directed., Disp: 30 mL, Rfl: 0 ?  butalbital-acetaminophen-caffeine (FIORICET) 50-325-40 MG tablet, TAKE 1 TABLET BY MOUTH EVERY 6 HOURS AS NEEDED FOR HEADACHE, Disp: 75 tablet, Rfl: 5 ?  celecoxib (CELEBREX) 100 MG capsule, Take 1 capsule by mouth 2 times daily., Disp: 60 capsule, Rfl: 5 ?  cetirizine (ZYRTEC) 10 MG tablet, 1 tablet, Disp: , Rfl:  ?  esomeprazole (NEXIUM) 40 MG capsule, Take 1 capsule by mouth daily., Disp: 90 capsule, Rfl: 3 ?  estrogens, conjugated, (PREMARIN) 0.625 MG tablet, Take 1 tablet (0.625 mg total) by mouth daily., Disp: 90 tablet, Rfl: 4 ?  fluticasone (FLONASE) 50 MCG/ACT nasal spray, PLACE 2 SPRAYS INTO BOTH NOSTRILS DAILY., Disp: 16 g, Rfl: 6 ?  hydrocortisone 2.5 % cream, 1 application to affected area, Disp: , Rfl:  ?  linaclotide (LINZESS) 72 MCG capsule, TAKE 1 CAPSULE BY MOUTH DAILY BEFORE BREAKFAST, Disp: 90 capsule, Rfl: 1 ?  naproxen sodium (ALEVE) 220 MG tablet, Take 220 mg by mouth 2 (two) times daily as needed., Disp: , Rfl:  ?  ondansetron (ZOFRAN-ODT) 4 MG disintegrating tablet, DISSOLVE 1 TABLET BY MOUTH EVERY 8 HOURS AS NEEDED FOR NAUSEA (MIGRAINES), Disp: 20 tablet, Rfl: 3 ?  oxybutynin (DITROPAN-XL) 5 MG 24 hr tablet, Take 1 tablet by mouth daily, Disp: 90 tablet, Rfl: 4 ?  Pramoxine-HC (HYDROCORTISONE ACE-PRAMOXINE) 2.5-1 % CREA, Apply TID-QID, Disp: 57 g, Rfl: 2 ?  propranolol (INDERAL) 10 MG tablet, Take 1 tablet (10 mg total) by mouth at bedtime., Disp: 90 tablet, Rfl:  3 ?  pseudoephedrine (SUDAFED) 30 MG tablet, Per box as needed, Disp: , Rfl:  ?  rizatriptan (MAXALT-MLT) 10 MG disintegrating tablet, Dissolve 1 tablet by mouth as needed for migraine. May repeat in 2 hours if needed, Disp: 10 tablet, Rfl: 11 ?  sodium chloride (OCEAN) 0.65 % SOLN nasal spray, Place 1 spray into both nostrils as needed for congestion., Disp: 50 mL, Rfl: 0 ?  tinidazole (TINDAMAX) 500 MG tablet, Take 4 tablets by mouth every day for 3 days., Disp: 12 tablet, Rfl: 1 ?  triamcinolone cream (KENALOG) 0.1 %, 1 application, Disp: , Rfl:  ? ?

## 2022-02-20 NOTE — Telephone Encounter (Signed)
Fioricet refilled

## 2022-02-21 ENCOUNTER — Ambulatory Visit: Payer: 59 | Admitting: Cardiology

## 2022-02-21 ENCOUNTER — Other Ambulatory Visit (HOSPITAL_COMMUNITY): Payer: Self-pay

## 2022-02-21 ENCOUNTER — Encounter: Payer: Self-pay | Admitting: Cardiology

## 2022-02-21 VITALS — BP 108/82 | HR 70 | Ht 64.0 in | Wt 136.4 lb

## 2022-02-21 DIAGNOSIS — I471 Supraventricular tachycardia: Secondary | ICD-10-CM

## 2022-02-21 MED ORDER — PROPRANOLOL HCL 10 MG PO TABS
10.0000 mg | ORAL_TABLET | Freq: Every evening | ORAL | 4 refills | Status: DC
  Filled 2022-02-21: qty 90, 90d supply, fill #0
  Filled 2022-05-21: qty 90, 90d supply, fill #1

## 2022-02-21 NOTE — Progress Notes (Signed)
?Cardiology Office Note:   ? ?Date:  02/21/2022  ? ?ID:  Mindy [name], DOB 1961-08-23, MRN [id number] ? ?PCP:  Binnie Rail, MD  ?Cardiologist:  Berniece Salines, DO  ?Electrophysiologist:  None  ? ?Referring MD: Binnie Rail, MD  ? ? ?History of Present Illness:   ? ?Mindy [name] is a 61 y.o. female with a hx of GERD, migraines Scism SVT here today for follow-up visit.  I saw the patient in ? ?Past Medical History:  ?Diagnosis Date  ? Allergic rhinitis   ? Complication of anesthesia   ? Elevated transaminase level 12.2014  ? ?etiology - present 09/2013 hosp for epigastric pain  ? Family history of adverse reaction to anesthesia   ? father - PONV  ? GERD (gastroesophageal reflux disease)   ? History of bronchitis 10/2014  ? Migraine   ? PONV (postoperative nausea and vomiting)   ? Seasonal allergies   ? ? ?Past Surgical History:  ?Procedure Laterality Date  ? BREAST BIOPSY Right   ? BREAST EXCISIONAL BIOPSY Right 2017  ? BREAST LUMPECTOMY WITH RADIOACTIVE SEED LOCALIZATION Right 10/08/2016  ? Procedure: RIGHT BREAST LUMPECTOMY WITH RADIOACTIVE SEED LOCALIZATION;  Surgeon: Coralie Keens, MD;  Location: [facility];  Service: General;  Laterality: Right;  ? CHOLECYSTECTOMY N/A 09/21/2015  ? Procedure: LAPAROSCOPIC CHOLECYSTECTOMY;  Surgeon: Coralie Keens, MD;  Location: [facility];  Service: General;  Laterality: N/A;  ? COLONOSCOPY  12/2013  ? NECK SURGERY  2009  ? PARTIAL HYSTERECTOMY  2007  ? UPPER GI ENDOSCOPY  2016  ? ? ?Current Medications: ?Current Meds  ?Medication Sig  ? albuterol (VENTOLIN HFA) 108 (90 Base) MCG/ACT inhaler INHALE 2 PUFFS INTO THE LUNGS EVERY 4 HOURS AS NEEDED FOR WHEEZING OR SHORTNESS OF BREATH  ? azelastine (ASTELIN) 0.1 % nasal spray Spray 1 spray into each nostril 2 (two) times daily as directed. (Patient taking differently: Place 1 spray into both nostrils daily.)  ? butalbital-acetaminophen-caffeine (FIORICET) 50-325-40 MG tablet TAKE 1 TABLET BY MOUTH EVERY 6 HOURS AS NEEDED FOR HEADACHE  ?  celecoxib (CELEBREX) 100 MG capsule Take 1 capsule by mouth 2 times daily.  ? cetirizine (ZYRTEC) 10 MG tablet 1 tablet  ? esomeprazole (NEXIUM) 40 MG capsule Take 1 capsule by mouth daily.  ? estrogens, conjugated, (PREMARIN) 0.625 MG tablet Take 1 tablet (0.625 mg total) by mouth daily.  ? fluticasone (FLONASE) 50 MCG/ACT nasal spray PLACE 2 SPRAYS INTO BOTH NOSTRILS DAILY.  ? linaclotide (LINZESS) 72 MCG capsule TAKE 1 CAPSULE BY MOUTH DAILY BEFORE BREAKFAST  ? ondansetron (ZOFRAN-ODT) 4 MG disintegrating tablet DISSOLVE 1 TABLET BY MOUTH EVERY 8 HOURS AS NEEDED FOR NAUSEA (MIGRAINES)  ? oxybutynin (DITROPAN-XL) 5 MG 24 hr tablet Take 1 tablet by mouth daily  ? pseudoephedrine (SUDAFED) 30 MG tablet Per box as needed  ? rizatriptan (MAXALT-MLT) 10 MG disintegrating tablet Dissolve 1 tablet by mouth as needed for migraine. May repeat in 2 hours if needed  ? sodium chloride (OCEAN) 0.65 % SOLN nasal spray Place 1 spray into both nostrils as needed for congestion.  ? triamcinolone cream (KENALOG) 0.1 % 1 application  ? [DISCONTINUED] propranolol (INDERAL) 10 MG tablet Take 1 tablet (10 mg total) by mouth at bedtime. (Patient taking differently: Take 5 mg by mouth 2 (two) times daily.)  ?  ? ?Allergies:   Oxaprozin  ? ?Social History  ? ?Socioeconomic History  ? Marital status: Divorced  ?  Spouse name: Not on file  ?  Number of children: Not on file  ? Years of education: Not on file  ? Highest education level: Not on file  ?Occupational History  ? Occupation: Therapist, sports  ?Tobacco Use  ? Smoking status: Never  ? Smokeless tobacco: Never  ?Vaping Use  ? Vaping Use: Never used  ?Substance and Sexual Activity  ? Alcohol use: Yes  ?  Comment: occ  ? Drug use: No  ? Sexual activity: Not on file  ?Other Topics Concern  ? Not on file  ?Social History Narrative  ? Exercise: on average 3/week - running, weights sometimes  ? ?Social Determinants of Health  ? ?Financial Resource Strain: Not on file  ?Food Insecurity: Not on file   ?Transportation Needs: Not on file  ?Physical Activity: Not on file  ?Stress: Not on file  ?Social Connections: Not on file  ?  ? ?Family History: ?The patient's family history includes Atrial fibrillation in her father; Breast cancer in her maternal aunt, maternal grandmother, and paternal grandmother; Coronary artery disease (age of onset: 54) in her father; Dementia in her mother; Diabetes type II in her father; Hypertension in her brother, father, and mother. ? ?ROS:   ?Review of Systems  ?Constitution: Negative for decreased appetite, fever and weight gain.  ?HENT: Negative for congestion, ear discharge, hoarse voice and sore throat.   ?Eyes: Negative for discharge, redness, vision loss in right eye and visual halos.  ?Cardiovascular: Negative for chest pain, dyspnea on exertion, leg swelling, orthopnea and palpitations.  ?Respiratory: Negative for cough, hemoptysis, shortness of breath and snoring.   ?Endocrine: Negative for heat intolerance and polyphagia.  ?Hematologic/Lymphatic: Negative for bleeding problem. Does not bruise/bleed easily.  ?Skin: Negative for flushing, nail changes, rash and suspicious lesions.  ?Musculoskeletal: Negative for arthritis, joint pain, muscle cramps, myalgias, neck pain and stiffness.  ?Gastrointestinal: Negative for abdominal pain, bowel incontinence, diarrhea and excessive appetite.  ?Genitourinary: Negative for decreased libido, genital sores and incomplete emptying.  ?Neurological: Negative for brief paralysis, focal weakness, headaches and loss of balance.  ?Psychiatric/Behavioral: Negative for altered mental status, depression and suicidal ideas.  ?Allergic/Immunologic: Negative for HIV exposure and persistent infections.  ? ? ?EKGs/Labs/Other Studies Reviewed:   ? ?The following studies were reviewed today: ? ? ?EKG:  The ekg ordered today demonstrates  ? ?Zio monitor 07/18/2021 ?Patient had a minimum heart rate of 38 bpm, maximum heart rate of 152 bpm, and average heart  rate of 78 bpm. ?Predominant underlying rhythm was sinus rhythm. ?Two runs of supraventricular tachycardia occurred lasting 12 seconds at longest with a max rate of 144 bpm at fastest. ?Isolated PACs were rare (<1.0%). ?Isolated PVCs were rare (<1.0%). ?Mobitz I heart block present. ?Triggered and diary events associated with sinus rhythm, sinus tachycardia, and PACs. ? Asymptomatic short runs of SVT. ? ?TTE 09/04/2021 ?IMPRESSIONS  ? ? ? 1. Left ventricular ejection fraction, by estimation, is 60 to 65%. Left  ?ventricular ejection fraction by 3D volume is 69 %. The left ventricle has  ?normal function. The left ventricle has no regional wall motion  ?abnormalities. Left ventricular diastolic  ? parameters were normal. The average left ventricular global longitudinal  ?strain is -21.1 %. The global longitudinal strain is normal.  ? 2. Right ventricular systolic function is normal. The right ventricular  ?size is normal.  ? 3. The mitral valve is normal in structure. Mild mitral valve  ?regurgitation. No evidence of mitral stenosis.  ? 4. The aortic valve is tricuspid. Aortic valve regurgitation is trivial.  ?  No aortic stenosis is present.  ? 5. The inferior vena cava is normal in size with greater than 50%  ?respiratory variability, suggesting right atrial pressure of 3 mmHg.  ? ?FINDINGS  ? Left Ventricle: Left ventricular ejection fraction, by estimation, is 60  ?to 65%. Left ventricular ejection fraction by 3D volume is 69 %. The left  ?ventricle has normal function. The left ventricle has no regional wall  ?motion abnormalities. The average  ?left ventricular global longitudinal strain is -21.1 %. The global  ?longitudinal strain is normal. The left ventricular internal cavity size  ?was normal in size. There is no left ventricular hypertrophy. Left  ?ventricular diastolic parameters were normal.  ? ?Right Ventricle: The right ventricular size is normal. No increase in  ?right ventricular wall thickness. Right  ventricular systolic function is  ?normal.  ? ?Left Atrium: Left atrial size was normal in size.  ? ?Right Atrium: Right atrial size was normal in size.  ? ?Pericardium: There is no evidence of pericardial

## 2022-02-21 NOTE — Patient Instructions (Signed)
Medication Instructions:  Your physician recommends that you continue on your current medications as directed. Please refer to the Current Medication list given to you today.  *If you need a refill on your cardiac medications before your next appointment, please call your pharmacy*   Lab Work: None If you have labs (blood work) drawn today and your tests are completely normal, you will receive your results only by: MyChart Message (if you have MyChart) OR A paper copy in the mail If you have any lab test that is abnormal or we need to change your treatment, we will call you to review the results.   Testing/Procedures: None   Follow-Up: At [facility], you and your health needs are our priority.  As part of our continuing mission to provide you with exceptional heart care, we have created designated Provider Care Teams.  These Care Teams include your primary Cardiologist (physician) and Advanced Practice Providers (APPs -  Physician Assistants and Nurse Practitioners) who all work together to provide you with the care you need, when you need it.  We recommend signing up for the patient portal called "MyChart".  Sign up information is provided on this After Visit Summary.  MyChart is used to connect with patients for Virtual Visits (Telemedicine).  Patients are able to view lab/test results, encounter notes, upcoming appointments, etc.  Non-urgent messages can be sent to your provider as well.   To learn more about what you can do with MyChart, go to [email or web address].    Your next appointment:   1 year(s)  The format for your next appointment:   In Person  Provider:   Kardie Tobb, DO     Other Instructions   Important Information About Sugar       

## 2022-02-23 ENCOUNTER — Encounter: Payer: Self-pay | Admitting: Internal Medicine

## 2022-02-23 DIAGNOSIS — I471 Supraventricular tachycardia: Secondary | ICD-10-CM | POA: Insufficient documentation

## 2022-03-10 ENCOUNTER — Encounter: Payer: Self-pay | Admitting: Emergency Medicine

## 2022-03-10 ENCOUNTER — Other Ambulatory Visit: Payer: Self-pay

## 2022-03-10 ENCOUNTER — Emergency Department
Admission: EM | Admit: 2022-03-10 | Discharge: 2022-03-10 | Disposition: A | Discharge status: Home / Self Care | Payer: 59 | Source: Home / Self Care

## 2022-03-10 DIAGNOSIS — J0100 Acute maxillary sinusitis, unspecified: Secondary | ICD-10-CM

## 2022-03-10 DIAGNOSIS — J309 Allergic rhinitis, unspecified: Secondary | ICD-10-CM | POA: Diagnosis not present

## 2022-03-10 DIAGNOSIS — J3489 Other specified disorders of nose and nasal sinuses: Secondary | ICD-10-CM | POA: Diagnosis not present

## 2022-03-10 MED ORDER — PREDNISONE 20 MG PO TABS: ORAL_TABLET | ORAL | 0 refills | Status: DC

## 2022-03-10 MED ORDER — FEXOFENADINE HCL 180 MG PO TABS: 180.0000 mg | ORAL_TABLET | Freq: Every day | ORAL | 0 refills | Status: DC

## 2022-03-10 MED ORDER — AMOXICILLIN-POT CLAVULANATE 875-125 MG PO TABS: 1.0000 | ORAL_TABLET | Freq: Two times a day (BID) | ORAL | 0 refills | Status: AC

## 2022-03-10 NOTE — Discharge Instructions (Addendum)
Instructed patient to discontinue Astelin, Zyrtec, and Flonase.  Instructed patient to take medication as directed with food to completion.  Advised patient to take prednisone and Allegra with first dose of Augmentin for the next 5 of 10 days.  Advised may use Allegra as needed afterwards for concurrent postnasal drainage/drip.  Encouraged patient to increase daily water intake while taking these medications.  Advised patient if symptoms worsen and/or unresolved please follow-up with PCP or here for further evaluation.

## 2022-03-10 NOTE — ED Provider Notes (Signed)
[name]    CSN: [id number] Arrival date & time: 03/10/22  0810      History   Chief Complaint Chief Complaint  Patient presents with   Facial Pain    HPI Mindy [name] is a 61 y.o. female.   HPI Pleasant 61 year old female presents with sinus nasal congestion and pressure for 1 week.  Reports 2 negative home COVID-19 test this week and OTC Flonase has provided no relief.  PMH significant for HLD, migraine, and insomnia.  Past Medical History:  Diagnosis Date   Allergic rhinitis    Complication of anesthesia    Elevated transaminase level 12.2014   ?etiology - present 09/2013 hosp for epigastric pain   Family history of adverse reaction to anesthesia    father - PONV   GERD (gastroesophageal reflux disease)    History of bronchitis 10/2014   Migraine    PONV (postoperative nausea and vomiting)    Seasonal allergies     Patient Active Problem List   Diagnosis Date Noted   PSVT (paroxysmal supraventricular tachycardia) ([facility]) 02/23/2022   Elbow pain, left 02/15/2022   Lateral epicondylitis of right elbow 02/15/2022   Palpitations 06/29/2021   Intractable migraine 12/21/2020   Bursal cyst of olecranon 10/11/2020   Family history of diabetes mellitus in father 09/10/2020   Subacromial bursitis of left shoulder joint 06/13/2020   Patellofemoral pain syndrome of left knee 03/23/2020   Cervical disc disorder with radiculopathy of cervical region 10/12/2018   Hyperlipidemia 09/07/2018   Hip injury, right, initial encounter 07/03/2018   Right hip pain 05/07/2018   Sleep difficulties 06/06/2017   Constipation 06/06/2017   Insomnia 04/03/2017   Thrombophlebitis of superficial veins of right lower extremity 03/14/2017   Low back pain 02/13/2017   Right sided sciatica 02/13/2017   RLQ abdominal pain 08/20/2016   Anal itching 08/20/2016   GERD (gastroesophageal reflux disease) 06/05/2016   Migraine 12/27/2015   Headache 12/27/2015   Lateral epicondylitis  of left elbow 06/27/2015   Cervical radiculitis 06/27/2015   Non-allergic vasomotor rhinitis 06/01/2012    Past Surgical History:  Procedure Laterality Date   BREAST BIOPSY Right    BREAST EXCISIONAL BIOPSY Right 2017   BREAST LUMPECTOMY WITH RADIOACTIVE SEED LOCALIZATION Right 10/08/2016   Procedure: RIGHT BREAST LUMPECTOMY WITH RADIOACTIVE SEED LOCALIZATION;  Surgeon: Coralie Keens, MD;  Location: [facility];  Service: General;  Laterality: Right;   CHOLECYSTECTOMY N/A 09/21/2015   Procedure: LAPAROSCOPIC CHOLECYSTECTOMY;  Surgeon: Coralie Keens, MD;  Location: [facility];  Service: General;  Laterality: N/A;   COLONOSCOPY  12/2013   NECK SURGERY  2009   PARTIAL HYSTERECTOMY  2007   UPPER GI ENDOSCOPY  2016    OB History   No obstetric history on file.      Home Medications    Prior to Admission medications   Medication Sig Start Date End Date Taking? Authorizing Provider  amoxicillin-clavulanate (AUGMENTIN) 875-125 MG tablet Take 1 tablet by mouth 2 (two) times daily for 10 days. 03/10/22 03/20/22 Yes Eliezer Lofts, FNP  fexofenadine [facility] ALLERGY) 180 MG tablet Take 1 tablet (180 mg total) by mouth daily for 15 days. 03/10/22 03/25/22 Yes Eliezer Lofts, FNP  predniSONE (DELTASONE) 20 MG tablet Take 3 tabs PO daily x 5 days. 03/10/22  Yes Eliezer Lofts, FNP  albuterol (VENTOLIN HFA) 108 (90 Base) MCG/ACT inhaler INHALE 2 PUFFS INTO THE LUNGS EVERY 4 HOURS AS NEEDED FOR WHEEZING OR SHORTNESS OF BREATH 08/09/21 08/09/22  Young, Tarri Fuller D,  MD  azelastine (ASTELIN) 0.1 % nasal spray Spray 1 spray into each nostril 2 (two) times daily as directed. Patient taking differently: Place 1 spray into both nostrils daily. 09/25/21   Brunetta Jeans, PA-C  butalbital-acetaminophen-caffeine (FIORICET) [phone] MG tablet TAKE 1 TABLET BY MOUTH EVERY 6 HOURS AS NEEDED FOR HEADACHE 02/20/22 02/20/23  Baird Lyons D, MD  celecoxib (CELEBREX) 100 MG capsule Take 1 capsule by mouth 2 times daily.  02/15/22   Binnie Rail, MD  cetirizine (ZYRTEC) 10 MG tablet 1 tablet 01/17/16   [provider]  esomeprazole (NEXIUM) 40 MG capsule Take 1 capsule by mouth daily. 02/15/22   Binnie Rail, MD  estrogens, conjugated, (PREMARIN) 0.625 MG tablet Take 1 tablet (0.625 mg total) by mouth daily. 09/18/21     fluticasone (FLONASE) 50 MCG/ACT nasal spray PLACE 2 SPRAYS INTO BOTH NOSTRILS DAILY. 08/09/21 08/09/22  Deneise Lever, MD  linaclotide (LINZESS) 72 MCG capsule TAKE 1 CAPSULE BY MOUTH DAILY BEFORE BREAKFAST 02/19/22 02/19/23  Binnie Rail, MD  ondansetron (ZOFRAN-ODT) 4 MG disintegrating tablet DISSOLVE 1 TABLET BY MOUTH EVERY 8 HOURS AS NEEDED FOR NAUSEA (MIGRAINES) 07/23/21 07/23/22  Binnie Rail, MD  oxybutynin (DITROPAN-XL) 5 MG 24 hr tablet Take 1 tablet by mouth daily 09/18/21     propranolol (INDERAL) 10 MG tablet Take 1 tablet  by mouth at bedtime. 02/21/22   Tobb, Kardie, DO  pseudoephedrine (SUDAFED) 30 MG tablet Per box as needed    [provider]  rizatriptan (MAXALT-MLT) 10 MG disintegrating tablet Dissolve 1 tablet by mouth as needed for migraine. May repeat in 2 hours if needed 06/19/21   Binnie Rail, MD  sodium chloride (OCEAN) 0.65 % SOLN nasal spray Place 1 spray into both nostrils as needed for congestion. 01/15/20   Montine Circle, PA-C  triamcinolone cream (KENALOG) 0.1 % 1 application Patient not taking: Reported on 03/10/2022    [provider]    Family History Family History  Problem Relation Age of Onset   Hypertension Mother    Dementia Mother    Atrial fibrillation Father    Diabetes type II Father    Hypertension Father    Coronary artery disease Father 18       stent x 2, AoVR   Hypertension Brother    Breast cancer Maternal Grandmother    Breast cancer Paternal Grandmother    Breast cancer Maternal Aunt     Social History Social History   Tobacco Use   Smoking status: Never   Smokeless tobacco: Never  Vaping Use    Vaping Use: Never used  Substance Use Topics   Alcohol use: Not Currently   Drug use: No     Allergies   Oxaprozin   Review of Systems Review of Systems  HENT:  Positive for congestion, postnasal drip, sinus pressure and sinus pain.   Respiratory:  Positive for cough.   All other systems reviewed and are negative.   Physical Exam Triage Vital Signs ED Triage Vitals  Enc Vitals Group     BP 03/10/22 0832 120/87     Pulse Rate 03/10/22 0832 75     Resp 03/10/22 0832 16     Temp 03/10/22 0832 97.8 F (36.6 C)     Temp src --      SpO2 03/10/22 0832 100 %     Weight 03/10/22 0828 134 lb (60.8 kg)     Height 03/10/22 0828 '5\' 4"'$  (1.626 m)  Head Circumference --      Peak Flow --      Pain Score 03/10/22 0828 5     Pain Loc --      Pain Edu? --      Excl. in [place]? --    No data found.  Updated Vital Signs BP 120/87 (BP Location: Right Arm)   Pulse 75   Temp 97.8 F (36.6 C)   Resp 16   Ht '5\' 4"'$  (1.626 m)   Wt 134 lb (60.8 kg)   SpO2 100%   BMI 23.00 kg/m       Physical Exam Vitals and nursing note reviewed.  Constitutional:      Appearance: Normal appearance. She is normal weight. She is ill-appearing.  HENT:     Head: Normocephalic and atraumatic.     Right Ear: Tympanic membrane and external ear normal.     Left Ear: Tympanic membrane and external ear normal.     Ears:     Comments: Moderate to significant eustachian tube dysfunction noted    Nose:     Right Turbinates: Enlarged.     Left Turbinates: Enlarged.     Right Sinus: Maxillary sinus tenderness present.     Left Sinus: Maxillary sinus tenderness present.     Comments: Turbinates are erythematous/edematous    Mouth/Throat:     Mouth: Mucous membranes are moist.     Pharynx: Oropharynx is clear.     Comments: Significant amount of clear drainage of posterior oropharynx noted Eyes:     Extraocular Movements: Extraocular movements intact.     Conjunctiva/sclera: Conjunctivae normal.      Pupils: Pupils are equal, round, and reactive to light.  Cardiovascular:     Rate and Rhythm: Normal rate and regular rhythm.     Pulses: Normal pulses.     Heart sounds: Normal heart sounds.  Pulmonary:     Effort: Pulmonary effort is normal.     Breath sounds: Normal breath sounds. No wheezing, rhonchi or rales.     Comments: Infrequent nonproductive cough noted on exam Musculoskeletal:     Cervical back: Normal range of motion and neck supple.  Skin:    General: Skin is warm and dry.  Neurological:     General: No focal deficit present.     Mental Status: She is alert and oriented to person, place, and time. Mental status is at baseline.     UC Treatments / Results  Labs (all labs ordered are listed, but only abnormal results are displayed) Labs Reviewed - No data to display  EKG   Radiology No results found.  Procedures Procedures (including critical care time)  Medications Ordered in UC Medications - No data to display  Initial Impression / Assessment and Plan / UC Course  I have reviewed the triage vital signs and the nursing notes.  Pertinent labs & imaging results that were available during my care of the patient were reviewed by me and considered in my medical decision making (see chart for details).     MDM: 1.  Acute maxillary sinusitis, recurrence not specified-Rx'd Augmentin; 2.  Sinus pressure-Rx'd prednisone; 3.  Allergic rhinitis-Rx'd Allegra. Instructed patient to discontinue Astelin, Zyrtec, and Flonase.  Instructed patient to take medication as directed with food to completion.  Advised patient to take prednisone and Allegra with first dose of Augmentin for the next 5 of 10 days.  Advised may use Allegra as needed afterwards for concurrent postnasal drainage/drip.  Encouraged patient  to increase daily water intake while taking these medications.  Advised patient if symptoms worsen and/or unresolved please follow-up with PCP or here for further evaluation.   Patient discharged home, hemodynamically stable. Final Clinical Impressions(s) / UC Diagnoses   Final diagnoses:  Acute maxillary sinusitis, recurrence not specified  Sinus pressure  Allergic rhinitis, unspecified seasonality, unspecified trigger     Discharge Instructions      Instructed patient to discontinue Astelin, Zyrtec, and Flonase.  Instructed patient to take medication as directed with food to completion.  Advised patient to take prednisone and Allegra with first dose of Augmentin for the next 5 of 10 days.  Advised may use Allegra as needed afterwards for concurrent postnasal drainage/drip.  Encouraged patient to increase daily water intake while taking these medications.  Advised patient if symptoms worsen and/or unresolved please follow-up with PCP or here for further evaluation.     ED Prescriptions     Medication Sig Dispense Auth. Provider   amoxicillin-clavulanate (AUGMENTIN) 875-125 MG tablet Take 1 tablet by mouth 2 (two) times daily for 10 days. 20 tablet Eliezer Lofts, FNP   predniSONE (DELTASONE) 20 MG tablet Take 3 tabs PO daily x 5 days. 15 tablet Eliezer Lofts, FNP   fexofenadine [facility] ALLERGY) 180 MG tablet Take 1 tablet (180 mg total) by mouth daily for 15 days. 15 tablet Eliezer Lofts, FNP      PDMP not reviewed this encounter.   Eliezer Lofts, [place] 03/10/22 [phone]

## 2022-03-10 NOTE — ED Triage Notes (Signed)
Sinus pressure w/ drainage  x 1 week  OTC sudafed & dayquil  Nasal spray - flonase No relief with meds  COVID home tests - negative x 2 this week   Last test was this morning Denies fever

## 2022-03-21 ENCOUNTER — Other Ambulatory Visit (HOSPITAL_COMMUNITY): Payer: Self-pay

## 2022-04-22 ENCOUNTER — Other Ambulatory Visit (HOSPITAL_COMMUNITY): Payer: Self-pay

## 2022-04-30 DIAGNOSIS — H53122 Transient visual loss, left eye: Secondary | ICD-10-CM | POA: Diagnosis not present

## 2022-04-30 DIAGNOSIS — H524 Presbyopia: Secondary | ICD-10-CM | POA: Diagnosis not present

## 2022-04-30 DIAGNOSIS — H5203 Hypermetropia, bilateral: Secondary | ICD-10-CM | POA: Diagnosis not present

## 2022-04-30 DIAGNOSIS — H52222 Regular astigmatism, left eye: Secondary | ICD-10-CM | POA: Diagnosis not present

## 2022-05-21 ENCOUNTER — Other Ambulatory Visit (HOSPITAL_COMMUNITY): Payer: Self-pay

## 2022-05-21 ENCOUNTER — Other Ambulatory Visit: Payer: Self-pay | Admitting: Internal Medicine

## 2022-05-21 MED ORDER — ONDANSETRON 4 MG PO TBDP
ORAL_TABLET | ORAL | 0 refills | Status: DC
Start: 2022-05-21 — End: 2022-09-09
  Filled 2022-05-21: qty 20, 7d supply, fill #0

## 2022-06-05 ENCOUNTER — Encounter: Payer: Self-pay | Admitting: Internal Medicine

## 2022-06-05 DIAGNOSIS — R000 Tachycardia, unspecified: Secondary | ICD-10-CM

## 2022-06-20 ENCOUNTER — Other Ambulatory Visit (HOSPITAL_COMMUNITY): Payer: Self-pay

## 2022-07-09 ENCOUNTER — Encounter: Payer: Self-pay | Admitting: Internal Medicine

## 2022-07-09 ENCOUNTER — Other Ambulatory Visit: Payer: Self-pay

## 2022-07-09 ENCOUNTER — Ambulatory Visit: Payer: 59 | Admitting: Internal Medicine

## 2022-07-09 ENCOUNTER — Other Ambulatory Visit (HOSPITAL_COMMUNITY): Payer: Self-pay

## 2022-07-09 VITALS — BP 118/82 | HR 58 | Temp 98.2°F | Resp 16 | Ht 64.0 in | Wt 138.8 lb

## 2022-07-09 DIAGNOSIS — R0789 Other chest pain: Secondary | ICD-10-CM

## 2022-07-09 DIAGNOSIS — R0609 Other forms of dyspnea: Secondary | ICD-10-CM

## 2022-07-09 DIAGNOSIS — R002 Palpitations: Secondary | ICD-10-CM

## 2022-07-09 DIAGNOSIS — E782 Mixed hyperlipidemia: Secondary | ICD-10-CM

## 2022-07-09 MED ORDER — METOPROLOL TARTRATE 25 MG PO TABS
25.0000 mg | ORAL_TABLET | Freq: Two times a day (BID) | ORAL | 3 refills | Status: DC
  Filled 2022-07-09: qty 180, 90d supply, fill #0

## 2022-07-09 MED ORDER — COLCHICINE 0.6 MG PO TABS
0.6000 mg | ORAL_TABLET | Freq: Every day | ORAL | 0 refills | Status: DC
  Filled 2022-07-09: qty 90, 90d supply, fill #0

## 2022-07-09 MED ORDER — METOPROLOL TARTRATE 25 MG PO TABS: 25.0000 mg | ORAL_TABLET | Freq: Two times a day (BID) | ORAL | 3 refills | Status: DC

## 2022-07-09 MED ORDER — COLCHICINE 0.6 MG PO TABS: 0.6000 mg | ORAL_TABLET | Freq: Every day | ORAL | 0 refills | Status: DC

## 2022-07-09 MED ORDER — METOPROLOL TARTRATE 25 MG PO TABS
25.0000 mg | ORAL_TABLET | Freq: Two times a day (BID) | ORAL | 3 refills | Status: DC
  Filled 2022-07-09 – 2022-07-11 (×3): qty 180, 90d supply, fill #0

## 2022-07-09 MED ORDER — ROSUVASTATIN CALCIUM 20 MG PO TABS: 20.0000 mg | ORAL_TABLET | Freq: Every day | ORAL | 3 refills | Status: DC

## 2022-07-09 MED ORDER — COLCHICINE 0.6 MG PO TABS
0.6000 mg | ORAL_TABLET | Freq: Every day | ORAL | 0 refills | Status: DC
  Filled 2022-07-09 – 2022-07-11 (×3): qty 90, 90d supply, fill #0

## 2022-07-09 NOTE — Progress Notes (Signed)
Primary Physician/Referring:  Binnie Rail, MD  Patient ID: Sanjuana Mae, female    DOB: [birth year], 61 y.o.   MRN: [id number]  Chief Complaint  Patient presents with   Tachycardia   New Patient (Initial Visit)    Referred by Dr. Billey Gosling   HPI:    CHARLE MCLAURIN  is a 61 y.o. female with history of GERD, migraines, and palpitations. She presents today for evaluation of tachycardia and palpitations.  She began having palpitations daily with exertion approximately 1 year ago. She saw her PCP for this and was placed on cardiac event monitor which revealed short runs of NSVT. She was started on Propranolol twice daily and feels this has helped with the frequency of the palpitations. However, she is still having palpitations occurring 3-4 times per week accompanied by mild shortness of breath and chest discomfort. Her heart rate is 140-160s with activity. She describes chest discomfort as pressure in left upper chest and left upper back that sometimes causes her left arm to ache. This only last for a few seconds during episodes of tachycardia and resolved on its own. She is unable to be as active as she once was and unable to run due to palpitations and tachycardia. She does walk daily and heart rate is 120-130s. Overall, she feels much more sluggish and fatigued during the day that her normal and contributes this to the propranolol.  She does not drink caffeine. She does drink 1 glass of red wine 1-2 times per week but has stopped this since August and feels that the palpitations are somewhat better.   Past Medical History:  Diagnosis Date   Allergic rhinitis    Complication of anesthesia    Elevated transaminase level 12.2014   ?etiology - present 09/2013 hosp for epigastric pain   Family history of adverse reaction to anesthesia    father - PONV   GERD (gastroesophageal reflux disease)    History of bronchitis 10/2014   Migraine    PONV (postoperative nausea and vomiting)     Seasonal allergies    Past Surgical History:  Procedure Laterality Date   BREAST BIOPSY Right    BREAST EXCISIONAL BIOPSY Right 2017   BREAST LUMPECTOMY WITH RADIOACTIVE SEED LOCALIZATION Right 10/08/2016   Procedure: RIGHT BREAST LUMPECTOMY WITH RADIOACTIVE SEED LOCALIZATION;  Surgeon: Coralie Keens, MD;  Location: [facility];  Service: General;  Laterality: Right;   CHOLECYSTECTOMY N/A 09/21/2015   Procedure: LAPAROSCOPIC CHOLECYSTECTOMY;  Surgeon: Coralie Keens, MD;  Location: [facility];  Service: General;  Laterality: N/A;   COLONOSCOPY  12/2013   NECK SURGERY  2009   PARTIAL HYSTERECTOMY  2007   UPPER GI ENDOSCOPY  2016   Family History  Problem Relation Age of Onset   Hypertension Mother    Dementia Mother    Atrial fibrillation Father    Diabetes type II Father    Hypertension Father    Coronary artery disease Father 83       stent x 2, AoVR   Hypertension Brother    Breast cancer Maternal Aunt    Breast cancer Maternal Grandmother    Breast cancer Paternal Grandmother     Social History   Tobacco Use   Smoking status: Never   Smokeless tobacco: Never  Substance Use Topics   Alcohol use: Not Currently   Marital Status: Divorced  ROS  Review of Systems  Constitutional: Positive for malaise/fatigue.  Cardiovascular:  Positive for chest pain, dyspnea on exertion, irregular  heartbeat and palpitations. Negative for leg swelling, near-syncope and syncope.   Objective  Blood pressure 118/82, pulse (!) 58, temperature 98.2 F (36.8 C), temperature source Temporal, resp. rate 16, height '5\' 4"'$  (1.626 m), weight 138 lb 12.8 oz (63 kg), SpO2 100 %. Body mass index is 23.82 kg/m.     07/09/2022   12:58 PM 03/10/2022    8:32 AM 03/10/2022    8:28 AM  Vitals with BMI  Height '5\' 4"'$   '5\' 4"'$   Weight 138 lbs 13 oz  134 lbs  BMI 16.94  50.38  Systolic 882 800   Diastolic 82 87   Pulse 58 75      Physical Exam Constitutional:      Appearance: Normal appearance.   Cardiovascular:     Rate and Rhythm: Bradycardia present.     Pulses:          Carotid pulses are 2+ on the right side and 2+ on the left side.      Radial pulses are 2+ on the right side and 2+ on the left side.       Femoral pulses are 2+ on the right side and 2+ on the left side.      Popliteal pulses are 1+ on the right side and 1+ on the left side.       Posterior tibial pulses are 1+ on the right side and 1+ on the left side.     Heart sounds: Normal heart sounds. No murmur heard. Pulmonary:     Effort: Pulmonary effort is normal.     Breath sounds: Normal breath sounds.  Abdominal:     General: Bowel sounds are normal.     Palpations: Abdomen is soft.  Neurological:     Mental Status: She is alert.    Medications and allergies   Allergies  Allergen Reactions   Oxaprozin Hives and Itching    "daypro"     Medication list after today's encounter   Current Outpatient Medications:    albuterol (VENTOLIN HFA) 108 (90 Base) MCG/ACT inhaler, INHALE 2 PUFFS INTO THE LUNGS EVERY 4 HOURS AS NEEDED FOR WHEEZING OR SHORTNESS OF BREATH, Disp: 18 g, Rfl: 3   azelastine (ASTELIN) 0.1 % nasal spray, Spray 1 spray into each nostril 2 (two) times daily as directed. (Patient taking differently: Place 1 spray into both nostrils daily.), Disp: 30 mL, Rfl: 0   butalbital-acetaminophen-caffeine (FIORICET) 50-325-40 MG tablet, TAKE 1 TABLET BY MOUTH EVERY 6 HOURS AS NEEDED FOR HEADACHE, Disp: 75 tablet, Rfl: 5   esomeprazole (NEXIUM) 40 MG capsule, Take 1 capsule by mouth daily., Disp: 90 capsule, Rfl: 3   estrogens, conjugated, (PREMARIN) 0.625 MG tablet, Take 1 tablet (0.625 mg total) by mouth daily., Disp: 90 tablet, Rfl: 4   fexofenadine (ALLEGRA ALLERGY) 180 MG tablet, Take 1 tablet (180 mg total) by mouth daily for 15 days., Disp: 15 tablet, Rfl: 0   fluticasone (FLONASE) 50 MCG/ACT nasal spray, PLACE 2 SPRAYS INTO BOTH NOSTRILS DAILY., Disp: 16 g, Rfl: 6   linaclotide (LINZESS) 72 MCG  capsule, TAKE 1 CAPSULE BY MOUTH DAILY BEFORE BREAKFAST, Disp: 90 capsule, Rfl: 1   ondansetron (ZOFRAN-ODT) 4 MG disintegrating tablet, DISSOLVE 1 TABLET BY MOUTH EVERY 8 HOURS AS NEEDED FOR NAUSEA (MIGRAINES), Disp: 20 tablet, Rfl: 0   oxybutynin (DITROPAN-XL) 5 MG 24 hr tablet, Take 1 tablet by mouth daily, Disp: 90 tablet, Rfl: 4   rizatriptan (MAXALT-MLT) 10 MG disintegrating tablet, Dissolve 1 tablet by  mouth as needed for migraine. May repeat in 2 hours if needed, Disp: 10 tablet, Rfl: 11   sodium chloride (OCEAN) 0.65 % SOLN nasal spray, Place 1 spray into both nostrils as needed for congestion., Disp: 50 mL, Rfl: 0   triamcinolone cream (KENALOG) 0.1 %, , Disp: , Rfl:    celecoxib (CELEBREX) 100 MG capsule, Take 1 capsule by mouth 2 times daily. (Patient not taking: Reported on 07/09/2022), Disp: 60 capsule, Rfl: 5   colchicine 0.6 MG tablet, Take 1 tablet (0.6 mg total) by mouth daily. Take 1 tablet (0.'6mg'$  total) by mouth daily for 3 months., Disp: 90 tablet, Rfl: 0   metoprolol tartrate (LOPRESSOR) 25 MG tablet, Take 1 tablet (25 mg total) by mouth 2 (two) times daily., Disp: 180 tablet, Rfl: 3   pseudoephedrine (SUDAFED) 30 MG tablet, Per box as needed (Patient not taking: Reported on 07/09/2022), Disp: , Rfl:   Laboratory examination:   Lab Results  Component Value Date   NA 138 06/29/2021   K 4.6 06/29/2021   CO2 31 06/29/2021   GLUCOSE 86 06/29/2021   BUN 8 06/29/2021   CREATININE 0.84 06/29/2021   CALCIUM 9.4 06/29/2021   GFRNONAA >60 09/12/2015       Latest Ref Rng & Units 06/29/2021   11:16 AM 09/11/2020    4:29 PM 09/09/2019    8:24 AM  CMP  Glucose 70 - 99 mg/dL 86  88  97   BUN 6 - 23 mg/dL '8  12  7   '$ Creatinine 0.40 - 1.20 mg/dL 0.84  0.78  0.98   Sodium 135 - 145 mEq/L 138  131  142   Potassium 3.5 - 5.1 mEq/L 4.6  4.3  4.5   Chloride 96 - 112 mEq/L 100  96  103   CO2 19 - 32 mEq/L '31  29  30   '$ Calcium 8.4 - 10.5 mg/dL 9.4  8.7  9.6   Total Protein 6.0 - 8.3  g/dL 6.6  6.7  7.1   Total Bilirubin 0.2 - 1.2 mg/dL 0.3  0.4  0.5   Alkaline Phos 39 - 117 U/L 71  64  83   AST 0 - 37 U/L '13  12  14   '$ ALT 0 - 35 U/L '11  9  15       '$ Latest Ref Rng & Units 06/29/2021   11:16 AM 09/11/2020    4:29 PM 09/09/2019    8:24 AM  CBC  WBC 4.0 - 10.5 K/uL 3.9  6.2  4.1   Hemoglobin 12.0 - 15.0 g/dL 13.6  13.9  14.9   Hematocrit 36.0 - 46.0 % 40.1  40.8  42.9   Platelets 150.0 - 400.0 K/uL 252.0  283.0  294.0     Lipid Panel No results for input(s): "CHOL", "TRIG", "LDLCALC", "VLDL", "HDL", "CHOLHDL", "LDLDIRECT" in the last 8760 hours.  HEMOGLOBIN A1C Lab Results  Component Value Date   HGBA1C 5.1 09/11/2020   TSH No results for input(s): "TSH" in the last 8760 hours.  External labs:    Radiology:    Cardiac Studies:   Echo 09/04/2021:  1. Left ventricular ejection fraction, by estimation, is 60 to 65%. Left  ventricular ejection fraction by 3D volume is 69 %. The left ventricle has  normal function. The left ventricle has no regional wall motion  abnormalities. Left ventricular diastolic   parameters were normal. The average left ventricular global longitudinal  strain is -21.1 %. The global  longitudinal strain is normal.   2. Right ventricular systolic function is normal. The right ventricular  size is normal.   3. The mitral valve is normal in structure. Mild mitral valve  regurgitation. No evidence of mitral stenosis.   4. The aortic valve is tricuspid. Aortic valve regurgitation is trivial.  No aortic stenosis is present.   5. The inferior vena cava is normal in size with greater than 50%  respiratory variability, suggesting right atrial pressure of 3 mmHg.   Long term monitor 96/28/2022: Patient had a minimum heart rate of 38 bpm, maximum heart rate of 152 bpm, and average heart rate of 78 bpm. Predominant underlying rhythm was sinus rhythm. Two runs of supraventricular tachycardia occurred lasting 12 seconds at longest with a max  rate of 144 bpm at fastest. Isolated PACs were rare (<1.0%).    Isolated PVCs were rare (<1.0%). Mobitz I heart block present. Triggered and diary events associated with sinus rhythm, sinus tachycardia, and PACs. Asymptomatic short runs of SVT   EKG:   EKG 07/09/22: Sinus bradycardia with rate of 52 bpm. Normal axis. Left atrial enlargement.  Assessment     ICD-10-CM   1. Palpitations  R00.2 EKG 12-Lead    PCV ECHOCARDIOGRAM COMPLETE    PCV MYOCARDIAL PERFUSION WO LEXISCAN    colchicine 0.6 MG tablet    metoprolol tartrate (LOPRESSOR) 25 MG tablet    DISCONTINUED: metoprolol tartrate (LOPRESSOR) 25 MG tablet    DISCONTINUED: colchicine 0.6 MG tablet    2. Dyspnea on exertion  R06.09 PCV ECHOCARDIOGRAM COMPLETE    PCV MYOCARDIAL PERFUSION WO LEXISCAN    colchicine 0.6 MG tablet    DISCONTINUED: colchicine 0.6 MG tablet    3. Chest pressure  R07.89 PCV ECHOCARDIOGRAM COMPLETE    PCV MYOCARDIAL PERFUSION WO LEXISCAN    colchicine 0.6 MG tablet    DISCONTINUED: colchicine 0.6 MG tablet    4. Mixed hyperlipidemia  E78.2 DISCONTINUED: rosuvastatin (CRESTOR) 20 MG tablet       Orders Placed This Encounter  Procedures   PCV MYOCARDIAL PERFUSION WO LEXISCAN    Standing Status:   Future    Standing Expiration Date:   09/08/2022   EKG 12-Lead   PCV ECHOCARDIOGRAM COMPLETE    Standing Status:   Future    Standing Expiration Date:   07/10/2023    Meds ordered this encounter  Medications   colchicine 0.6 MG tablet    Sig: Take 1 tablet (0.6 mg total) by mouth daily. Take 1 tablet (0.'6mg'$  total) by mouth daily for 3 months.    Dispense:  90 tablet    Refill:  0    Order Specific Question:   Supervising Provider    Answer:   Adrian Prows [[id number]]   metoprolol tartrate (LOPRESSOR) 25 MG tablet    Sig: Take 1 tablet (25 mg total) by mouth 2 (two) times daily.    Dispense:  180 tablet    Refill:  3    Order Specific Question:   Supervising Provider    Answer:   Adrian Prows [[id number]]     Medications Discontinued During This Encounter  Medication Reason   predniSONE (DELTASONE) 20 MG tablet    cetirizine (ZYRTEC) 10 MG tablet    propranolol (INDERAL) 10 MG tablet Change in therapy   rosuvastatin (CRESTOR) 20 MG tablet Change in therapy   metoprolol tartrate (LOPRESSOR) 25 MG tablet    colchicine 0.6 MG tablet      Recommendations:   Domanique  A Suto is a 61 y.o. female with history of GERD, migraines, and palpitations. She presents today for evaluation of tachycardia.   Palpitations Dyspnea on exertion Chest pressure Given presenting symptoms, would consider pericarditis but cannot rule out cardiac ischemia.  Labs reviewed: TSH normal. LDL elevated. She would like to avoid statin therapy at this time. Risks and benefits discussed. She will await next lipid check with PCP. Will start colchicine 0.'6mg'$  daily for 3 months, however, if no improvement in symptoms within 3-4 weeks will consider stopping this medication.  Stop propranolol and start Metoprolol 12.'5mg'$  twice daily. Ordered echo and stress test to rule out other causes of palpitations, DOE, and chest pressure.  If symptoms worsen she was instructed to call office. Would consider long term cardiac monitor if palpitations do not improve.  Follow-up in 6 weeks or sooner if needed.    Ernst Spell, NP  07/09/2022, 2:12 PM Office: [phone]

## 2022-07-11 ENCOUNTER — Other Ambulatory Visit (HOSPITAL_COMMUNITY): Payer: Self-pay

## 2022-07-12 ENCOUNTER — Other Ambulatory Visit (HOSPITAL_COMMUNITY): Payer: Self-pay

## 2022-07-16 ENCOUNTER — Other Ambulatory Visit (HOSPITAL_COMMUNITY): Payer: Self-pay

## 2022-07-17 ENCOUNTER — Other Ambulatory Visit: Payer: Self-pay | Admitting: Internal Medicine

## 2022-07-17 ENCOUNTER — Other Ambulatory Visit (HOSPITAL_COMMUNITY): Payer: Self-pay

## 2022-07-18 ENCOUNTER — Other Ambulatory Visit (HOSPITAL_COMMUNITY): Payer: Self-pay

## 2022-07-18 MED ORDER — RIZATRIPTAN BENZOATE 10 MG PO TBDP
10.0000 mg | ORAL_TABLET | ORAL | 0 refills | Status: DC | PRN
  Filled 2022-07-18: qty 10, 30d supply, fill #0

## 2022-07-29 ENCOUNTER — Ambulatory Visit: Payer: 59

## 2022-07-29 DIAGNOSIS — R002 Palpitations: Secondary | ICD-10-CM | POA: Diagnosis not present

## 2022-07-29 DIAGNOSIS — R0609 Other forms of dyspnea: Secondary | ICD-10-CM

## 2022-07-29 DIAGNOSIS — R0789 Other chest pain: Secondary | ICD-10-CM

## 2022-07-31 NOTE — Telephone Encounter (Signed)
From patient.

## 2022-08-06 ENCOUNTER — Ambulatory Visit: Payer: 59

## 2022-08-06 DIAGNOSIS — R002 Palpitations: Secondary | ICD-10-CM | POA: Diagnosis not present

## 2022-08-06 DIAGNOSIS — R0609 Other forms of dyspnea: Secondary | ICD-10-CM | POA: Diagnosis not present

## 2022-08-06 DIAGNOSIS — R0789 Other chest pain: Secondary | ICD-10-CM

## 2022-08-09 ENCOUNTER — Other Ambulatory Visit (HOSPITAL_BASED_OUTPATIENT_CLINIC_OR_DEPARTMENT_OTHER): Payer: Self-pay

## 2022-08-09 MED ORDER — FLUARIX QUADRIVALENT 0.5 ML IM SUSY
PREFILLED_SYRINGE | INTRAMUSCULAR | 0 refills | Status: DC
Start: 2022-08-09 — End: 2022-08-12
  Filled 2022-08-09: qty 0.5, 1d supply, fill #0

## 2022-08-10 NOTE — Progress Notes (Unsigned)
Patient ID: Mindy [name], female    DOB: 1961-08-04, 61 y.o.   MRN: [id number]  HPI female never smoker followed for chronic rhinitis/sinusitis, complicated by headache Allergy profile 12/27/13- Neg, Total IgE 4.5 Had failed allergy vaccine Eos not elevated 09/12/14 Sinus xray 12/12/2015-no evidence of acute sinusitis, clear -------------------------------------------------------------------   08/09/21- 61 year old female never smoker followed for chronic rhinitis/sinusitis,Insomnia,  complicated by Migraine, superficial thrombophlebitis, Cervical disc disease, GERD,  -Fioricet, sudafed, Ventolin hfa, Flonase/ Astelin or Dymista,       Relpax, Norco, Robaxin, -Diclofenac, Pamelor,  Covid vax- 5 Phizer Flu vax-had Zio XT monitor for palpitation> SVT Rarely needs rescue inhaler- cites exposure to cigarette smoke as a trigger.  Rhinitis currently controlled with Flonase used when needed.  Chronic headaches still respond to Fioricet  08/12/22-  62 year old female never smoker followed for chronic rhinitis/sinusitis,Insomnia,  complicated by Migraine, superficial thrombophlebitis, Cervical disc disease, GERD,  -Fioricet, sudafed, Ventolin hfa, Flonase/ Astelin or Dymista,       Relpax, Norco, Robaxin, -Diclofenac, Pamelor,  Covid vax- 5 Phizer Flu vax-had ED 5/21- acute max sinusitis> augmentin, allegra, prednisone Pt states she is doing okay. Minor sneeze, postnasal drip currently. Asks script for Allegra= cheaper. Asks refill Fioricet for recurrent headache. No wheezew or cough. Rare use of rescue inhaler. Being followed by cardiology for tachycardia.  Review of Systems- see HPI   +  = positive Constitutional:   No-   weight loss, night sweats, fevers, chills, fatigue, lassitude. HEENT:   +  headaches,  No-difficulty swallowing, tooth/dental problems, sore throat,       sneezing, itching, ear ache, +nasal congestion, post nasal drip,  CV:  chest pain, orthopnea, PND, swelling in  lower extremities, anasarca, dizziness, palpitations Resp: shortness of breath with exertion or at rest.              No-   productive cough,  non-productive cough,  No-  coughing up of blood.              No-   change in color of mucus.  No- wheezing.   Skin: No-   rash or lesions. GI:  No-   heartburn, indigestion, abdominal pain, nausea, vomiting,  GU:  MS:  No-   joint pain or swelling.   Neuro- nothing unusual Psych:  No- change in mood or affect. No depression or anxiety.  No memory loss.  Objective:   Physical Exam General- Alert, Oriented, Affect-appropriate, Distress- none  Skin- rash-none, lesions- none, excoriation- none Lymphadenopathy- none Head- atraumatic            Eyes- Gross vision intact, PERRLA, conjunctivae clear secretions            Ears- Hearing, canals            Nose- , +turbinate edema, +Septal dev, no-mucus,  No-polyps, erosion, perforation.     Masked            Throat- Mallampati III-IV , mucosa clear , drainage- none, tonsils- atrophic Neck- flexible , trachea midline, no stridor , thyroid nl, carotid no bruit Chest - symmetrical excursion , unlabored           Heart/CV- RRR , no murmur , no gallop  , no rub, nl s1 s2                           - JVD- none , edema- none, stasis changes- none, varices- none  Lung-  wheeze -none, cough-none , dullness-none, rub- none           Chest wall-  Abd-  Br/ Gen/ Rectal- Not done, not indicated Extrem- cyanosis- none, clubbing, none, atrophy- none, strength- nl Neuro- grossly intact to observation

## 2022-08-12 ENCOUNTER — Ambulatory Visit: Payer: 59 | Admitting: Internal Medicine

## 2022-08-12 ENCOUNTER — Encounter: Payer: Self-pay | Admitting: Internal Medicine

## 2022-08-12 ENCOUNTER — Other Ambulatory Visit (HOSPITAL_COMMUNITY): Payer: Self-pay

## 2022-08-12 DIAGNOSIS — J300 Vasomotor rhinitis: Secondary | ICD-10-CM | POA: Diagnosis not present

## 2022-08-12 DIAGNOSIS — I471 Supraventricular tachycardia, unspecified: Secondary | ICD-10-CM | POA: Diagnosis not present

## 2022-08-12 DIAGNOSIS — G43709 Chronic migraine without aura, not intractable, without status migrainosus: Secondary | ICD-10-CM

## 2022-08-12 MED ORDER — BUTALBITAL-APAP-CAFFEINE 50-325-40 MG PO TABS
1.0000 | ORAL_TABLET | Freq: Four times a day (QID) | ORAL | 5 refills | Status: DC | PRN
  Filled 2022-08-12: qty 75, 19d supply, fill #0
  Filled 2022-09-09: qty 75, 19d supply, fill #1
  Filled 2022-10-08: qty 75, 19d supply, fill #2
  Filled 2022-11-07: qty 75, 19d supply, fill #3
  Filled 2022-12-10: qty 75, 19d supply, fill #4
  Filled 2023-01-06: qty 75, 19d supply, fill #5

## 2022-08-12 MED ORDER — FEXOFENADINE HCL 180 MG PO TABS
180.0000 mg | ORAL_TABLET | Freq: Every day | ORAL | 4 refills | Status: DC
  Filled 2022-08-12 – 2022-09-09 (×2): qty 90, 90d supply, fill #0
  Filled 2023-02-23: qty 90, 90d supply, fill #1

## 2022-08-12 NOTE — Assessment & Plan Note (Signed)
Ok to send script for [employer]. Understands not to use Allegra-D if problem with tachycardia.

## 2022-08-12 NOTE — Assessment & Plan Note (Signed)
Fioricet refilled

## 2022-08-12 NOTE — Assessment & Plan Note (Signed)
Now on metoprolol per cardiology.

## 2022-08-12 NOTE — Patient Instructions (Signed)
Script sent for [facility] everything goes well. Please call if we can help.

## 2022-08-13 ENCOUNTER — Other Ambulatory Visit (HOSPITAL_COMMUNITY): Payer: Self-pay

## 2022-08-19 ENCOUNTER — Encounter: Payer: 59 | Admitting: Internal Medicine

## 2022-08-21 ENCOUNTER — Encounter: Payer: Self-pay | Admitting: Internal Medicine

## 2022-08-22 ENCOUNTER — Encounter: Payer: Self-pay | Admitting: Internal Medicine

## 2022-08-22 NOTE — Patient Instructions (Addendum)
Shingrix #2  given today   Blood work was ordered.   The lab is on the first floor.    Medications changes include : Augmentin twice daily for 10 days    Return in about 1 year (around 08/24/2023) for Physical Exam.     Health Maintenance, Female Adopting a healthy lifestyle and getting preventive care are important in promoting health and wellness. Ask your health care provider about: The right schedule for you to have regular tests and exams. Things you can do on your own to prevent diseases and keep yourself healthy. What should I know about diet, weight, and exercise? Eat a healthy diet  Eat a diet that includes plenty of vegetables, fruits, low-fat dairy products, and lean protein. Do not eat a lot of foods that are high in solid fats, added sugars, or sodium. Maintain a healthy weight Body mass index (BMI) is used to identify weight problems. It estimates body fat based on height and weight. Your health care provider can help determine your BMI and help you achieve or maintain a healthy weight. Get regular exercise Get regular exercise. This is one of the most important things you can do for your health. Most adults should: Exercise for at least 150 minutes each week. The exercise should increase your heart rate and make you sweat (moderate-intensity exercise). Do strengthening exercises at least twice a week. This is in addition to the moderate-intensity exercise. Spend less time sitting. Even light physical activity can be beneficial. Watch cholesterol and blood lipids Have your blood tested for lipids and cholesterol at 59 years of age, then have this test every 5 years. Have your cholesterol levels checked more often if: Your lipid or cholesterol levels are high. You are older than 59 years of age. You are at high risk for heart disease. What should I know about cancer screening? Depending on your health history and family history, you may need to have cancer  screening at various ages. This may include screening for: Breast cancer. Cervical cancer. Colorectal cancer. Skin cancer. Lung cancer. What should I know about heart disease, diabetes, and high blood pressure? Blood pressure and heart disease High blood pressure causes heart disease and increases the risk of stroke. This is more likely to develop in people who have high blood pressure readings or are overweight. Have your blood pressure checked: Every 3-5 years if you are 71-68 years of age. Every year if you are 52 years old or older. Diabetes Have regular diabetes screenings. This checks your fasting blood sugar level. Have the screening done: Once every three years after age 61 if you are at a normal weight and have a low risk for diabetes. More often and at a younger age if you are overweight or have a high risk for diabetes. What should I know about preventing infection? Hepatitis B If you have a higher risk for hepatitis B, you should be screened for this virus. Talk with your health care provider to find out if you are at risk for hepatitis B infection. Hepatitis C Testing is recommended for: Everyone born from [birth year] through 1963. Anyone with known risk factors for hepatitis C. Sexually transmitted infections (STIs) Get screened for STIs, including gonorrhea and chlamydia, if: You are sexually active and are younger than 59 years of age. You are older than 59 years of age and your health care provider tells you that you are at risk for this type of infection. Your sexual activity  has changed since you were last screened, and you are at increased risk for chlamydia or gonorrhea. Ask your health care provider if you are at risk. Ask your health care provider about whether you are at high risk for HIV. Your health care provider may recommend a prescription medicine to help prevent HIV infection. If you choose to take medicine to prevent HIV, you should first get tested for HIV. You  should then be tested every 3 months for as long as you are taking the medicine. Pregnancy If you are about to stop having your period (premenopausal) and you may become pregnant, seek counseling before you get pregnant. Take 400 to 800 micrograms (mcg) of folic acid every day if you become pregnant. Ask for birth control (contraception) if you want to prevent pregnancy. Osteoporosis and menopause Osteoporosis is a disease in which the bones lose minerals and strength with aging. This can result in bone fractures. If you are 76 years old or older, or if you are at risk for osteoporosis and fractures, ask your health care provider if you should: Be screened for bone loss. Take a calcium or vitamin D supplement to lower your risk of fractures. Be given hormone replacement therapy (HRT) to treat symptoms of menopause. Follow these instructions at home: Alcohol use Do not drink alcohol if: Your health care provider tells you not to drink. You are pregnant, may be pregnant, or are planning to become pregnant. If you drink alcohol: Limit how much you have to: 0-1 drink a day. Know how much alcohol is in your drink. In the [place] one drink equals one 12 oz bottle of beer (355 mL), one 5 oz glass of wine (148 mL), or one 1 oz glass of hard liquor (44 mL). Lifestyle Do not use any products that contain nicotine or tobacco. These products include cigarettes, chewing tobacco, and vaping devices, such as e-cigarettes. If you need help quitting, ask your health care provider. Do not use street drugs. Do not share needles. Ask your health care provider for help if you need support or information about quitting drugs. General instructions Schedule regular health, dental, and eye exams. Stay current with your vaccines. Tell your health care provider if: You often feel depressed. You have ever been abused or do not feel safe at home. Summary Adopting a healthy lifestyle and getting preventive care are  important in promoting health and wellness. Follow your health care provider's instructions about healthy diet, exercising, and getting tested or screened for diseases. Follow your health care provider's instructions on monitoring your cholesterol and blood pressure. This information is not intended to replace advice given to you by your health care provider. Make sure you discuss any questions you have with your health care provider. Document Revised: 02/26/2021 Document Reviewed: 02/26/2021 Elsevier Patient Education  [place].

## 2022-08-22 NOTE — Progress Notes (Signed)
Subjective:    Patient ID: Mindy [name], female    DOB: 1961-03-22, 61 y.o.   MRN: [id number]      HPI Mindy [name] is here for a Physical exam.    Saturday - cold symptoms started - sinus drainage  Medications and allergies reviewed with patient and updated if appropriate.  Current Outpatient Medications on File Prior to Visit  Medication Sig Dispense Refill   azelastine (ASTELIN) 0.1 % nasal spray Spray 1 spray into each nostril 2 (two) times daily as directed. (Patient taking differently: Place 1 spray into both nostrils daily.) 30 mL 0   butalbital-acetaminophen-caffeine (FIORICET) 50-325-40 MG tablet Take 1 tablet by mouth every 6 (six) hours as needed for headache. 75 tablet 5   celecoxib (CELEBREX) 100 MG capsule Take 1 capsule by mouth 2 times daily. 60 capsule 5   colchicine 0.6 MG tablet Take 1 tablet (0.6 mg total) by mouth daily. 90 tablet 0   esomeprazole (NEXIUM) 40 MG capsule Take 1 capsule by mouth daily. 90 capsule 3   estrogens, conjugated, (PREMARIN) 0.625 MG tablet Take 1 tablet (0.625 mg total) by mouth daily. 90 tablet 4   fexofenadine (ALLEGRA) 180 MG tablet Take 1 tablet (180 mg total) by mouth daily. 90 tablet 4   linaclotide (LINZESS) 72 MCG capsule TAKE 1 CAPSULE BY MOUTH DAILY BEFORE BREAKFAST 90 capsule 1   metoprolol tartrate (LOPRESSOR) 25 MG tablet Take 1 tablet (25 mg total) by mouth 2 (two) times daily. 180 tablet 3   ondansetron (ZOFRAN-ODT) 4 MG disintegrating tablet DISSOLVE 1 TABLET BY MOUTH EVERY 8 HOURS AS NEEDED FOR NAUSEA (MIGRAINES) 20 tablet 0   oxybutynin (DITROPAN-XL) 5 MG 24 hr tablet Take 1 tablet by mouth daily 90 tablet 4   pseudoephedrine (SUDAFED) 30 MG tablet Per box as needed     rizatriptan (MAXALT-MLT) 10 MG disintegrating tablet Dissolve 1 tablet by mouth as needed for migraine. May repeat in 2 hours if needed 10 tablet 0   sodium chloride (OCEAN) 0.65 % SOLN nasal spray Place 1 spray into both nostrils as needed for congestion. 50  mL 0   albuterol (VENTOLIN HFA) 108 (90 Base) MCG/ACT inhaler INHALE 2 PUFFS INTO THE LUNGS EVERY 4 HOURS AS NEEDED FOR WHEEZING OR SHORTNESS OF BREATH 18 g 3   fluticasone (FLONASE) 50 MCG/ACT nasal spray PLACE 2 SPRAYS INTO BOTH NOSTRILS DAILY. 16 g 6   No current facility-administered medications on file prior to visit.    Review of Systems  Constitutional:  Negative for chills and fever.  HENT:  Positive for congestion, postnasal drip, sinus pressure, sinus pain and sore throat. Negative for ear pain.   Eyes:  Positive for visual disturbance (aura with migraine - otherwise none).  Respiratory:  Positive for cough. Negative for shortness of breath and wheezing.   Cardiovascular:  Positive for palpitations. Negative for chest pain and leg swelling.  Gastrointestinal:  Positive for constipation and nausea (with migraines). Negative for abdominal pain, blood in stool and diarrhea.  Genitourinary:  Negative for dysuria.  Musculoskeletal:  Positive for back pain (occ). Negative for arthralgias.  Neurological:  Positive for headaches. Negative for dizziness and light-headedness.  Psychiatric/Behavioral:  Negative for dysphoric mood. The patient is not nervous/anxious.        Objective:   Vitals:   08/23/22 1342  BP: 108/74  Pulse: 67  Temp: 98 F (36.7 C)  SpO2: 98%   Filed Weights   08/23/22 1342  Weight: 135 lb (  61.2 kg)   Body mass index is 23.17 kg/m.  BP Readings from Last 3 Encounters:  08/23/22 108/74  08/12/22 110/66  07/09/22 118/82    Wt Readings from Last 3 Encounters:  08/23/22 135 lb (61.2 kg)  08/12/22 139 lb (63 kg)  07/09/22 138 lb 12.8 oz (63 kg)       Physical Exam Constitutional: She appears well-developed and well-nourished. No distress.  HENT:  Head: Normocephalic and atraumatic.  Right Ear: External ear normal. Normal ear canal and TM Left Ear: External ear normal.  Normal ear canal and TM Mouth/Throat: Oropharynx is clear and moist.   Eyes: Conjunctivae normal.  Neck: Neck supple. No tracheal deviation present. No thyromegaly present.  No carotid bruit  Cardiovascular: Normal rate, regular rhythm and normal heart sounds.   No murmur heard.  No edema. Pulmonary/Chest: Effort normal and breath sounds normal. No respiratory distress. She has no wheezes. She has no rales.  Breast: deferred   Abdominal: Soft. She exhibits no distension. There is no tenderness.  Lymphadenopathy: She has no cervical adenopathy.  Skin: Skin is warm and dry. She is not diaphoretic.  Psychiatric: She has a normal mood and affect. Her behavior is normal.     Lab Results  Component Value Date   WBC 3.9 (L) 06/29/2021   HGB 13.6 06/29/2021   HCT 40.1 06/29/2021   PLT 252.0 06/29/2021   GLUCOSE 86 06/29/2021   CHOL 213 (H) 06/29/2021   TRIG 84.0 06/29/2021   HDL 77.20 06/29/2021   LDLCALC 119 (H) 06/29/2021   ALT 11 06/29/2021   AST 13 06/29/2021   NA 138 06/29/2021   K 4.6 06/29/2021   CL 100 06/29/2021   CREATININE 0.84 06/29/2021   BUN 8 06/29/2021   CO2 31 06/29/2021   TSH 1.75 06/29/2021   INR 1.10 09/20/2013   HGBA1C 5.1 09/11/2020         Assessment & Plan:   Physical exam: Screening blood work  ordered Exercise  regular Weight   normal Substance abuse  none   Reviewed recommended immunizations.  Shingrix #2 today   Health Maintenance  Topic Date Due   PAP SMEAR-Modifier  10/20/2015   Zoster Vaccines- Shingrix (2 of 2) 11/06/2020   COLONOSCOPY (Pts 45-55yr Insurance coverage will need to be confirmed)  01/04/2024   MAMMOGRAM  01/26/2024   TETANUS/TDAP  09/11/2030   INFLUENZA VACCINE  Completed   COVID-19 Vaccine  Completed   Hepatitis C Screening  Completed   HIV Screening  Completed   HPV VACCINES  Aged Out          See Problem List for Assessment and Plan of chronic medical problems.

## 2022-08-23 ENCOUNTER — Ambulatory Visit (INDEPENDENT_AMBULATORY_CARE_PROVIDER_SITE_OTHER): Payer: 59 | Admitting: Internal Medicine

## 2022-08-23 ENCOUNTER — Other Ambulatory Visit (HOSPITAL_COMMUNITY): Payer: Self-pay

## 2022-08-23 VITALS — BP 108/74 | HR 67 | Temp 98.0°F | Ht 64.0 in | Wt 135.0 lb

## 2022-08-23 DIAGNOSIS — Z0000 Encounter for general adult medical examination without abnormal findings: Secondary | ICD-10-CM | POA: Diagnosis not present

## 2022-08-23 DIAGNOSIS — Z23 Encounter for immunization: Secondary | ICD-10-CM

## 2022-08-23 DIAGNOSIS — E782 Mixed hyperlipidemia: Secondary | ICD-10-CM

## 2022-08-23 DIAGNOSIS — K5904 Chronic idiopathic constipation: Secondary | ICD-10-CM

## 2022-08-23 DIAGNOSIS — N3281 Overactive bladder: Secondary | ICD-10-CM | POA: Insufficient documentation

## 2022-08-23 DIAGNOSIS — G43709 Chronic migraine without aura, not intractable, without status migrainosus: Secondary | ICD-10-CM

## 2022-08-23 DIAGNOSIS — Z8679 Personal history of other diseases of the circulatory system: Secondary | ICD-10-CM | POA: Insufficient documentation

## 2022-08-23 DIAGNOSIS — I471 Supraventricular tachycardia, unspecified: Secondary | ICD-10-CM | POA: Diagnosis not present

## 2022-08-23 DIAGNOSIS — K219 Gastro-esophageal reflux disease without esophagitis: Secondary | ICD-10-CM | POA: Diagnosis not present

## 2022-08-23 DIAGNOSIS — J019 Acute sinusitis, unspecified: Secondary | ICD-10-CM | POA: Insufficient documentation

## 2022-08-23 LAB — COMPREHENSIVE METABOLIC PANEL
ALT: 10 U/L (ref 0–35)
AST: 13 U/L (ref 0–37)
Albumin: 4.4 g/dL (ref 3.5–5.2)
Alkaline Phosphatase: 69 U/L (ref 39–117)
BUN: 8 mg/dL (ref 6–23)
CO2: 30 mEq/L (ref 19–32)
Calcium: 9.3 mg/dL (ref 8.4–10.5)
Chloride: 97 mEq/L (ref 96–112)
Creatinine, Ser: 0.81 mg/dL (ref 0.40–1.20)
GFR: 79.56 mL/min (ref >60.00)
Glucose, Bld: 84 mg/dL (ref 70–99)
Potassium: 3.7 mEq/L (ref 3.5–5.1)
Sodium: 135 mEq/L (ref 135–145)
Total Bilirubin: 0.3 mg/dL (ref 0.2–1.2)
Total Protein: 7 g/dL (ref 6.0–8.3)

## 2022-08-23 LAB — CBC WITH DIFFERENTIAL/PLATELET
Basophils Absolute: 0 10*3/uL (ref 0.0–0.1)
Basophils Relative: 0.9 % (ref 0.0–3.0)
Eosinophils Absolute: 0.1 10*3/uL (ref 0.0–0.7)
Eosinophils Relative: 2.6 % (ref 0.0–5.0)
HCT: 41.2 % (ref 36.0–46.0)
Hemoglobin: 14.3 g/dL (ref 12.0–15.0)
Lymphocytes Relative: 38.4 % (ref 12.0–46.0)
Lymphs Abs: 1.7 10*3/uL (ref 0.7–4.0)
MCHC: 34.6 g/dL (ref 30.0–36.0)
MCV: 96.5 fl (ref 78.0–100.0)
Monocytes Absolute: 0.5 10*3/uL (ref 0.1–1.0)
Monocytes Relative: 12.2 % — ABNORMAL HIGH (ref 3.0–12.0)
Neutro Abs: 2 10*3/uL (ref 1.4–7.7)
Neutrophils Relative %: 45.9 % (ref 43.0–77.0)
Platelets: 263 10*3/uL (ref 150.0–400.0)
RBC: 4.28 Mil/uL (ref 3.87–5.11)
RDW: 12.5 % (ref 11.5–15.5)
WBC: 4.5 10*3/uL (ref 4.0–10.5)

## 2022-08-23 LAB — LIPID PANEL
Cholesterol: 207 mg/dL — ABNORMAL HIGH (ref 0–200)
HDL: 80.6 mg/dL (ref >39.00)
LDL Cholesterol: 105 mg/dL — ABNORMAL HIGH (ref 0–99)
NonHDL: 125.92
Total CHOL/HDL Ratio: 3
Triglycerides: 106 mg/dL (ref 0.0–149.0)
VLDL: 21.2 mg/dL (ref 0.0–40.0)

## 2022-08-23 LAB — TSH: TSH: 1.91 u[IU]/mL (ref 0.35–5.50)

## 2022-08-23 MED ORDER — AMOXICILLIN-POT CLAVULANATE 875-125 MG PO TABS
1.0000 | ORAL_TABLET | Freq: Two times a day (BID) | ORAL | 0 refills | Status: DC
Start: 2022-08-23 — End: 2022-09-09
  Filled 2022-08-23: qty 20, 10d supply, fill #0

## 2022-08-23 NOTE — Assessment & Plan Note (Addendum)
Chronic Controlled, Stable Continue Linzess 72 mcg daily prn

## 2022-08-23 NOTE — Assessment & Plan Note (Signed)
Acute Likely bacterial  Start Augmentin 875-125 mg BID x 10 day otc cold medications Rest, fluid Call if no improvement  

## 2022-08-23 NOTE — Assessment & Plan Note (Addendum)
Chronic On oxybutynin

## 2022-08-23 NOTE — Assessment & Plan Note (Signed)
No longer on metoprolol due to hypotension, bradycardia

## 2022-08-23 NOTE — Addendum Note (Signed)
Addended by: Marcina Millard on: 08/23/2022 04:24 PM   Modules accepted: Orders

## 2022-08-23 NOTE — Assessment & Plan Note (Signed)
Chronic Taking Maxalt 10 mg as needed for migraines, takes Fioricet as needed Continue above

## 2022-08-23 NOTE — Assessment & Plan Note (Addendum)
Chronic GERD controlled Continue Nexium 40 mg daily prn

## 2022-08-23 NOTE — Assessment & Plan Note (Signed)
On colchicine Following with cardiology

## 2022-08-23 NOTE — Assessment & Plan Note (Signed)
Chronic Regular exercise and healthy diet encouraged Check lipid panel  Continue lifestyle control 

## 2022-09-06 ENCOUNTER — Other Ambulatory Visit: Payer: Self-pay | Admitting: Internal Medicine

## 2022-09-06 ENCOUNTER — Other Ambulatory Visit (HOSPITAL_COMMUNITY): Payer: Self-pay

## 2022-09-06 MED ORDER — RIZATRIPTAN BENZOATE 10 MG PO TBDP
10.0000 mg | ORAL_TABLET | ORAL | 0 refills | Status: DC | PRN
  Filled 2022-09-06: qty 10, 30d supply, fill #0

## 2022-09-09 ENCOUNTER — Other Ambulatory Visit (HOSPITAL_COMMUNITY): Payer: Self-pay

## 2022-09-09 ENCOUNTER — Other Ambulatory Visit: Payer: Self-pay | Admitting: Internal Medicine

## 2022-09-09 ENCOUNTER — Ambulatory Visit: Payer: 59

## 2022-09-09 VITALS — BP 113/77 | HR 71 | Resp 15 | Ht 64.0 in | Wt 134.8 lb

## 2022-09-09 DIAGNOSIS — R0609 Other forms of dyspnea: Secondary | ICD-10-CM | POA: Diagnosis not present

## 2022-09-09 DIAGNOSIS — E782 Mixed hyperlipidemia: Secondary | ICD-10-CM

## 2022-09-09 DIAGNOSIS — R0789 Other chest pain: Secondary | ICD-10-CM

## 2022-09-09 DIAGNOSIS — R002 Palpitations: Secondary | ICD-10-CM | POA: Diagnosis not present

## 2022-09-09 MED ORDER — FLUTICASONE PROPIONATE 50 MCG/ACT NA SUSP
2.0000 | Freq: Every day | NASAL | 6 refills | Status: DC
  Filled 2022-09-09: qty 16, 30d supply, fill #0
  Filled 2023-02-23: qty 16, 30d supply, fill #1
  Filled 2023-05-12: qty 16, 30d supply, fill #2
  Filled 2023-06-26: qty 48, 90d supply, fill #3

## 2022-09-09 MED ORDER — ONDANSETRON 4 MG PO TBDP
ORAL_TABLET | ORAL | 3 refills | Status: DC
Start: 2022-09-09 — End: 2023-02-03
  Filled 2022-09-09: qty 20, 7d supply, fill #0
  Filled 2022-10-08: qty 20, 7d supply, fill #1
  Filled 2022-11-07: qty 20, 7d supply, fill #2
  Filled 2022-12-10: qty 20, 7d supply, fill #3

## 2022-09-09 NOTE — Progress Notes (Signed)
Primary Physician/Referring:  Binnie Rail, MD  Patient ID: Mindy [name], female    DOB: 07/20/1961, 61 y.o.   MRN: [id number]  Chief Complaint  Patient presents with   Palpitations   Follow-up    1 month   HPI:    Mindy [name]  is a 61 y.o. female with history of GERD, migraines, and palpitations. She presents today for evaluation of tachycardia and palpitations.  She presents today for 6-week follow-up for management of palpitations.  At last visit she was started on colchicine for concern of pericarditis and metoprolol 12.5 mg twice daily for palpitations.  She has since stopped taking the metoprolol due to decreases in blood pressure.  She is still having occasional episodes of palpitations mostly after getting out of the shower or when laying on her left side.  These are occurring 1-2 times per week and are not currently bothersome to her. She denies chest pain, shortness of breath, leg edema, orthopnea, PND, syncope.  Past Medical History:  Diagnosis Date   Allergic rhinitis    Complication of anesthesia    Elevated transaminase level 12.2014   ?etiology - present 09/2013 hosp for epigastric pain   Family history of adverse reaction to anesthesia    father - PONV   GERD (gastroesophageal reflux disease)    History of bronchitis 10/2014   Migraine    PONV (postoperative nausea and vomiting)    Seasonal allergies    Past Surgical History:  Procedure Laterality Date   BREAST BIOPSY Right    BREAST EXCISIONAL BIOPSY Right 2017   BREAST LUMPECTOMY WITH RADIOACTIVE SEED LOCALIZATION Right 10/08/2016   Procedure: RIGHT BREAST LUMPECTOMY WITH RADIOACTIVE SEED LOCALIZATION;  Surgeon: Coralie Keens, MD;  Location: [facility];  Service: General;  Laterality: Right;   CHOLECYSTECTOMY N/A 09/21/2015   Procedure: LAPAROSCOPIC CHOLECYSTECTOMY;  Surgeon: Coralie Keens, MD;  Location: [place];  Service: General;  Laterality: N/A;   COLONOSCOPY  12/2013   NECK SURGERY  2009   PARTIAL  HYSTERECTOMY  2007   UPPER GI ENDOSCOPY  2016   Family History  Problem Relation Age of Onset   Hypertension Mother    Dementia Mother    Atrial fibrillation Father    Diabetes type II Father    Hypertension Father    Coronary artery disease Father 68       stent x 2, AoVR   Hypertension Brother    Breast cancer Maternal Aunt    Breast cancer Maternal Grandmother    Breast cancer Paternal Grandmother     Social History   Tobacco Use   Smoking status: Never   Smokeless tobacco: Never  Substance Use Topics   Alcohol use: Yes    Alcohol/week: 2.0 standard drinks of alcohol    Types: 1 Glasses of wine, 1 Cans of beer per week    Comment: occasionally   Marital Status: Divorced  ROS  Review of Systems  Constitutional: Negative for malaise/fatigue.  Cardiovascular:  Positive for palpitations (improving). Negative for chest pain, dyspnea on exertion, irregular heartbeat, leg swelling, near-syncope and syncope.   Objective  Blood pressure 113/77, pulse 71, resp. rate 15, height '5\' 4"'$  (1.626 m), weight 134 lb 12.8 oz (61.1 kg), SpO2 95 %. Body mass index is 23.14 kg/m.     09/09/2022    2:11 PM 08/23/2022    1:42 PM 08/12/2022    3:47 PM  Vitals with BMI  Height '5\' 4"'$  '5\' 4"'$  '5\' 4"'$   Weight  134 lbs 13 oz 135 lbs 139 lbs  BMI 23.13 41.74 08.14  Systolic 481 856 314  Diastolic 77 74 66  Pulse 71 67 59     Physical Exam Cardiovascular:     Rate and Rhythm: Normal rate and regular rhythm.     Pulses:          Radial pulses are 2+ on the right side and 2+ on the left side.     Heart sounds: Normal heart sounds. No murmur heard. Pulmonary:     Effort: Pulmonary effort is normal.     Breath sounds: Normal breath sounds.  Musculoskeletal:     Right lower leg: No edema.     Left lower leg: No edema.    Medications and allergies   Allergies  Allergen Reactions   Oxaprozin Hives and Itching    "daypro"     Medication list after today's encounter   Current Outpatient  Medications:    albuterol (VENTOLIN HFA) 108 (90 Base) MCG/ACT inhaler, INHALE 2 PUFFS INTO THE LUNGS EVERY 4 HOURS AS NEEDED FOR WHEEZING OR SHORTNESS OF BREATH, Disp: 18 g, Rfl: 3   azelastine (ASTELIN) 0.1 % nasal spray, Spray 1 spray into each nostril 2 (two) times daily as directed. (Patient taking differently: Place 1 spray into both nostrils daily.), Disp: 30 mL, Rfl: 0   butalbital-acetaminophen-caffeine (FIORICET) 50-325-40 MG tablet, Take 1 tablet by mouth every 6 (six) hours as needed for headache., Disp: 75 tablet, Rfl: 5   celecoxib (CELEBREX) 100 MG capsule, Take 1 capsule by mouth 2 times daily., Disp: 60 capsule, Rfl: 5   esomeprazole (NEXIUM) 40 MG capsule, Take 1 capsule by mouth daily., Disp: 90 capsule, Rfl: 3   estrogens, conjugated, (PREMARIN) 0.625 MG tablet, Take 1 tablet (0.625 mg total) by mouth daily., Disp: 90 tablet, Rfl: 4   fexofenadine (ALLEGRA) 180 MG tablet, Take 1 tablet (180 mg total) by mouth daily., Disp: 90 tablet, Rfl: 4   linaclotide (LINZESS) 72 MCG capsule, TAKE 1 CAPSULE BY MOUTH DAILY BEFORE BREAKFAST, Disp: 90 capsule, Rfl: 1   ondansetron (ZOFRAN-ODT) 4 MG disintegrating tablet, DISSOLVE 1 TABLET BY MOUTH EVERY 8 HOURS AS NEEDED FOR NAUSEA (MIGRAINES), Disp: 20 tablet, Rfl: 3   oxybutynin (DITROPAN-XL) 5 MG 24 hr tablet, Take 1 tablet by mouth daily, Disp: 90 tablet, Rfl: 4   pseudoephedrine (SUDAFED) 30 MG tablet, Per box as needed, Disp: , Rfl:    rizatriptan (MAXALT-MLT) 10 MG disintegrating tablet, Dissolve 1 tablet by mouth as needed for migraine. May repeat in 2 hours if needed, Disp: 10 tablet, Rfl: 0   sodium chloride (OCEAN) 0.65 % SOLN nasal spray, Place 1 spray into both nostrils as needed for congestion., Disp: 50 mL, Rfl: 0   fluticasone (FLONASE) 50 MCG/ACT nasal spray, Place 2 sprays into both nostrils daily., Disp: 16 g, Rfl: 6  Laboratory examination:   Lab Results  Component Value Date   NA 135 08/23/2022   K 3.7 08/23/2022   CO2  30 08/23/2022   GLUCOSE 84 08/23/2022   BUN 8 08/23/2022   CREATININE 0.81 08/23/2022   CALCIUM 9.3 08/23/2022   GFRNONAA >60 09/12/2015       Latest Ref Rng & Units 08/23/2022    2:23 PM 06/29/2021   11:16 AM 09/11/2020    4:29 PM  CMP  Glucose 70 - 99 mg/dL 84  86  88   BUN 6 - 23 mg/dL '8  8  12   '$ Creatinine  0.40 - 1.20 mg/dL 0.81  0.84  0.78   Sodium 135 - 145 mEq/L 135  138  131   Potassium 3.5 - 5.1 mEq/L 3.7  4.6  4.3   Chloride 96 - 112 mEq/L 97  100  96   CO2 19 - 32 mEq/L '30  31  29   '$ Calcium 8.4 - 10.5 mg/dL 9.3  9.4  8.7   Total Protein 6.0 - 8.3 g/dL 7.0  6.6  6.7   Total Bilirubin 0.2 - 1.2 mg/dL 0.3  0.3  0.4   Alkaline Phos 39 - 117 U/L 69  71  64   AST 0 - 37 U/L '13  13  12   '$ ALT 0 - 35 U/L '10  11  9       '$ Latest Ref Rng & Units 08/23/2022    2:23 PM 06/29/2021   11:16 AM 09/11/2020    4:29 PM  CBC  WBC 4.0 - 10.5 K/uL 4.5  3.9  6.2   Hemoglobin 12.0 - 15.0 g/dL 14.3  13.6  13.9   Hematocrit 36.0 - 46.0 % 41.2  40.1  40.8   Platelets 150.0 - 400.0 K/uL 263.0  252.0  283.0     Lipid Panel Recent Labs    08/23/22 1423  CHOL 207*  TRIG 106.0  LDLCALC 105*  VLDL 21.2  HDL 80.60  CHOLHDL 3    HEMOGLOBIN A1C Lab Results  Component Value Date   HGBA1C 5.1 09/11/2020   TSH Recent Labs    08/23/22 1423  TSH 1.91    External labs:    Radiology:    Cardiac Studies:   Long term monitor 96/28/2022: Patient had a minimum heart rate of 38 bpm, maximum heart rate of 152 bpm, and average heart rate of 78 bpm. Predominant underlying rhythm was sinus rhythm. Two runs of supraventricular tachycardia occurred lasting 12 seconds at longest with a max rate of 144 bpm at fastest. Isolated PACs were rare (<1.0%).    Isolated PVCs were rare (<1.0%). Mobitz I heart block present. Triggered and diary events associated with sinus rhythm, sinus tachycardia, and PACs. Asymptomatic short runs of SVT  Exercise nuclear stress test 07/29/2022: Normal ECG  stress. The patient exercised for 6 minutes and 58 seconds of a Bruce protocol, achieving approximately 8.53 METs and achieved 101% of MPHR.  The blood pressure response was normal. Myocardial perfusion is normal. Overall LV systolic function is normal without regional wall motion abnormalities. Stress LV EF: 80%.  Low risk.   Echocardiogram 08/06/2022: Normal LV systolic function with visual EF 60-65%. Left ventricle cavity is normal in size. Normal left ventricular wall thickness. Normal global wall motion. Normal diastolic filling pattern, normal LAP. Trace aortic regurgitation. Trace tricuspid regurgitation. No evidence of tricuspid valve stenosis. No evidence of pulmonary hypertension. RVSP measures 32 mmHg. Compared to 09/04/2021 Mild MR is now trace otherwise no significant change.  EKG:   EKG 07/09/22: Sinus bradycardia with rate of 52 bpm. Normal axis. Left atrial enlargement.  Assessment     ICD-10-CM   1. Palpitations  R00.2     2. Chest pressure  R07.89     3. Dyspnea on exertion  R06.09     4. Mixed hyperlipidemia  E78.2         No orders of the defined types were placed in this encounter.   Meds ordered this encounter  Medications   colchicine 0.6 MG tablet    Sig: Take 1 tablet (0.6  mg total) by mouth daily. Take 1 tablet (0.'6mg'$  total) by mouth daily for 3 months.    Dispense:  90 tablet    Refill:  0    Order Specific Question:   Supervising Provider    Answer:   Adrian Prows [[id number]]   metoprolol tartrate (LOPRESSOR) 25 MG tablet    Sig: Take 1 tablet (25 mg total) by mouth 2 (two) times daily.    Dispense:  180 tablet    Refill:  3    Order Specific Question:   Supervising Provider    Answer:   Adrian Prows [[id number]]    Medications Discontinued During This Encounter  Medication Reason   amoxicillin-clavulanate (AUGMENTIN) 875-125 MG tablet    metoprolol tartrate (LOPRESSOR) 25 MG tablet    colchicine 0.6 MG tablet      Recommendations:   Mindy [name] is a 61 y.o. female with history of GERD, migraines, and palpitations. She presents today for evaluation of tachycardia.   Palpitations Dyspnea on exertion Chest pressure Given ongoing palpitations, we did discuss the option of a long-term cardiac event monitor.  However since palpitations have improved and are not currently bothersome to her we will hold off on this for now. Previous symptoms of chest pain and dyspnea on exertion have resolved. We will stop colchicine. Reviewed recent labs, TSH normal, kidney function and electrolytes normal. Advised patient to notify office if she has more frequent symptoms of tachycardia or palpitations or develops any new symptoms including chest pain or shortness of breath.   Mixed hyperlipidemia Reviewed recent lipid panel from 08/23/2022, LDL 105.  She would like to avoid statin therapy.  Okay with LDL at this level given HDL of 80 and low risk for cardiovascular event.   Follow-up on as-needed basis.   Ernst Spell, AGNP-C  09/09/2022, 2:51 PM Office: [phone]

## 2022-09-10 ENCOUNTER — Other Ambulatory Visit (HOSPITAL_COMMUNITY): Payer: Self-pay

## 2022-09-27 ENCOUNTER — Encounter: Payer: 59 | Admitting: Internal Medicine

## 2022-10-08 ENCOUNTER — Other Ambulatory Visit: Payer: Self-pay

## 2022-10-08 ENCOUNTER — Other Ambulatory Visit (HOSPITAL_COMMUNITY): Payer: Self-pay

## 2022-10-10 ENCOUNTER — Other Ambulatory Visit: Payer: Self-pay | Admitting: Internal Medicine

## 2022-10-10 ENCOUNTER — Other Ambulatory Visit (HOSPITAL_COMMUNITY): Payer: Self-pay

## 2022-10-10 ENCOUNTER — Other Ambulatory Visit: Payer: Self-pay

## 2022-10-10 MED ORDER — RIZATRIPTAN BENZOATE 10 MG PO TBDP
10.0000 mg | ORAL_TABLET | ORAL | 0 refills | Status: DC | PRN
  Filled 2022-10-10: qty 10, 30d supply, fill #0

## 2022-10-28 ENCOUNTER — Other Ambulatory Visit (HOSPITAL_COMMUNITY): Payer: Self-pay

## 2022-11-07 ENCOUNTER — Other Ambulatory Visit (HOSPITAL_COMMUNITY): Payer: Self-pay

## 2022-11-07 ENCOUNTER — Other Ambulatory Visit: Payer: Self-pay

## 2022-11-07 ENCOUNTER — Other Ambulatory Visit: Payer: Self-pay | Admitting: Internal Medicine

## 2022-11-07 MED ORDER — RIZATRIPTAN BENZOATE 10 MG PO TBDP
10.0000 mg | ORAL_TABLET | ORAL | 0 refills | Status: DC | PRN
  Filled 2022-11-07: qty 10, 30d supply, fill #0

## 2022-11-09 ENCOUNTER — Other Ambulatory Visit (HOSPITAL_COMMUNITY): Payer: Self-pay

## 2022-11-12 ENCOUNTER — Other Ambulatory Visit (HOSPITAL_COMMUNITY): Payer: Self-pay

## 2022-11-12 ENCOUNTER — Encounter (HOSPITAL_COMMUNITY): Payer: Self-pay

## 2022-12-06 ENCOUNTER — Other Ambulatory Visit (HOSPITAL_COMMUNITY): Payer: Self-pay

## 2022-12-06 ENCOUNTER — Other Ambulatory Visit: Payer: Self-pay

## 2022-12-06 MED ORDER — OXYBUTYNIN CHLORIDE ER 5 MG PO TB24
5.0000 mg | ORAL_TABLET | Freq: Every day | ORAL | 4 refills | Status: DC
  Filled 2022-12-06: qty 90, 90d supply, fill #0

## 2022-12-10 ENCOUNTER — Other Ambulatory Visit (HOSPITAL_COMMUNITY): Payer: Self-pay

## 2022-12-10 ENCOUNTER — Other Ambulatory Visit: Payer: Self-pay | Admitting: Internal Medicine

## 2022-12-10 ENCOUNTER — Other Ambulatory Visit: Payer: Self-pay

## 2022-12-10 ENCOUNTER — Encounter: Payer: Self-pay | Admitting: Internal Medicine

## 2022-12-10 DIAGNOSIS — B9789 Other viral agents as the cause of diseases classified elsewhere: Secondary | ICD-10-CM

## 2022-12-10 MED ORDER — AZELASTINE HCL 0.1 % NA SOLN
1.0000 | Freq: Two times a day (BID) | NASAL | 12 refills | Status: DC
  Filled 2022-12-10: qty 30, 50d supply, fill #0
  Filled 2023-02-23: qty 30, 50d supply, fill #1
  Filled 2023-05-12: qty 30, 50d supply, fill #2

## 2022-12-10 MED ORDER — RIZATRIPTAN BENZOATE 10 MG PO TBDP
10.0000 mg | ORAL_TABLET | ORAL | 0 refills | Status: DC | PRN
  Filled 2022-12-10: qty 10, 30d supply, fill #0

## 2022-12-10 NOTE — Telephone Encounter (Signed)
Ok to refill x 1 year Thanks

## 2022-12-10 NOTE — Telephone Encounter (Signed)
Dr. Annamaria Boots, please advise if ok to send Astelin nasal spray? This has only been filled once by our office and the last time was in 2020. Thanks.   Allergies  Allergen Reactions   Oxaprozin Hives and Itching    "daypro"    Current Outpatient Medications on File Prior to Visit  Medication Sig Dispense Refill   albuterol (VENTOLIN HFA) 108 (90 Base) MCG/ACT inhaler INHALE 2 PUFFS INTO THE LUNGS EVERY 4 HOURS AS NEEDED FOR WHEEZING OR SHORTNESS OF BREATH 18 g 3   azelastine (ASTELIN) 0.1 % nasal spray Spray 1 spray into each nostril 2 (two) times daily as directed. (Patient taking differently: Place 1 spray into both nostrils daily.) 30 mL 0   butalbital-acetaminophen-caffeine (FIORICET) 50-325-40 MG tablet Take 1 tablet by mouth every 6 (six) hours as needed for headache. 75 tablet 5   celecoxib (CELEBREX) 100 MG capsule Take 1 capsule by mouth 2 times daily. 60 capsule 5   esomeprazole (NEXIUM) 40 MG capsule Take 1 capsule by mouth daily. 90 capsule 3   estrogens, conjugated, (PREMARIN) 0.625 MG tablet Take 1 tablet (0.625 mg total) by mouth daily. 90 tablet 4   fexofenadine (ALLEGRA) 180 MG tablet Take 1 tablet (180 mg total) by mouth daily. 90 tablet 4   fluticasone (FLONASE) 50 MCG/ACT nasal spray Place 2 sprays into both nostrils daily. 16 g 6   linaclotide (LINZESS) 72 MCG capsule TAKE 1 CAPSULE BY MOUTH DAILY BEFORE BREAKFAST 90 capsule 1   ondansetron (ZOFRAN-ODT) 4 MG disintegrating tablet DISSOLVE 1 TABLET BY MOUTH EVERY 8 HOURS AS NEEDED FOR NAUSEA (MIGRAINES) 20 tablet 3   oxybutynin (DITROPAN-XL) 5 MG 24 hr tablet Take 1 tablet by mouth daily 90 tablet 4   oxybutynin (DITROPAN-XL) 5 MG 24 hr tablet Take 1 tablet (5 mg total) by mouth daily. 90 tablet 4   pseudoephedrine (SUDAFED) 30 MG tablet Per box as needed     rizatriptan (MAXALT-MLT) 10 MG disintegrating tablet Dissolve 1 tablet by mouth as needed for migraine. May repeat in 2 hours if needed 10 tablet 0   sodium chloride  (OCEAN) 0.65 % SOLN nasal spray Place 1 spray into both nostrils as needed for congestion. 50 mL 0   No current facility-administered medications on file prior to visit.

## 2022-12-16 DIAGNOSIS — Z1329 Encounter for screening for other suspected endocrine disorder: Secondary | ICD-10-CM | POA: Diagnosis not present

## 2022-12-16 DIAGNOSIS — Z13220 Encounter for screening for lipoid disorders: Secondary | ICD-10-CM | POA: Diagnosis not present

## 2022-12-16 DIAGNOSIS — Z130 Encounter for screening for diseases of the blood and blood-forming organs and certain disorders involving the immune mechanism: Secondary | ICD-10-CM | POA: Diagnosis not present

## 2022-12-16 DIAGNOSIS — N951 Menopausal and female climacteric states: Secondary | ICD-10-CM | POA: Diagnosis not present

## 2022-12-17 ENCOUNTER — Other Ambulatory Visit: Payer: Self-pay

## 2022-12-17 ENCOUNTER — Other Ambulatory Visit (HOSPITAL_COMMUNITY): Payer: Self-pay

## 2022-12-17 ENCOUNTER — Other Ambulatory Visit (HOSPITAL_BASED_OUTPATIENT_CLINIC_OR_DEPARTMENT_OTHER): Payer: Self-pay

## 2022-12-17 MED ORDER — PREMARIN 0.9 MG PO TABS
0.9000 mg | ORAL_TABLET | Freq: Every day | ORAL | 4 refills | Status: DC
  Filled 2022-12-17 (×2): qty 90, 90d supply, fill #0
  Filled 2023-03-15: qty 90, 90d supply, fill #1
  Filled 2023-06-26: qty 90, 90d supply, fill #2

## 2022-12-17 MED ORDER — MYRBETRIQ 25 MG PO TB24
25.0000 mg | ORAL_TABLET | Freq: Every day | ORAL | 11 refills | Status: DC
  Filled 2022-12-17 (×2): qty 30, 30d supply, fill #0
  Filled 2023-01-06 – 2023-01-10 (×3): qty 30, 30d supply, fill #1

## 2022-12-17 MED ORDER — OXYBUTYNIN CHLORIDE ER 5 MG PO TB24
5.0000 mg | ORAL_TABLET | Freq: Every day | ORAL | 4 refills | Status: DC
  Filled 2022-12-17 – 2023-03-11 (×2): qty 90, 90d supply, fill #0
  Filled 2023-06-26: qty 90, 90d supply, fill #1

## 2022-12-31 ENCOUNTER — Other Ambulatory Visit: Payer: Self-pay

## 2022-12-31 ENCOUNTER — Encounter: Payer: Self-pay | Admitting: Internal Medicine

## 2022-12-31 MED ORDER — LINACLOTIDE 72 MCG PO CAPS
72.0000 ug | ORAL_CAPSULE | Freq: Every morning | ORAL | 1 refills | Status: DC
  Filled 2022-12-31: qty 90, 90d supply, fill #0
  Filled 2023-05-12: qty 90, 90d supply, fill #1

## 2023-01-06 ENCOUNTER — Other Ambulatory Visit (HOSPITAL_COMMUNITY): Payer: Self-pay

## 2023-01-06 ENCOUNTER — Other Ambulatory Visit: Payer: Self-pay | Admitting: Internal Medicine

## 2023-01-06 MED ORDER — RIZATRIPTAN BENZOATE 10 MG PO TBDP
10.0000 mg | ORAL_TABLET | ORAL | 0 refills | Status: DC | PRN
  Filled 2023-01-06: qty 10, 30d supply, fill #0

## 2023-01-07 ENCOUNTER — Other Ambulatory Visit (HOSPITAL_COMMUNITY): Payer: Self-pay

## 2023-01-07 ENCOUNTER — Other Ambulatory Visit: Payer: Self-pay

## 2023-01-10 ENCOUNTER — Other Ambulatory Visit: Payer: Self-pay

## 2023-02-03 ENCOUNTER — Encounter: Payer: Self-pay | Admitting: *Deleted

## 2023-02-03 ENCOUNTER — Other Ambulatory Visit: Payer: Self-pay | Admitting: Internal Medicine

## 2023-02-04 ENCOUNTER — Other Ambulatory Visit (HOSPITAL_COMMUNITY): Payer: Self-pay

## 2023-02-04 ENCOUNTER — Other Ambulatory Visit: Payer: Self-pay

## 2023-02-04 MED ORDER — ONDANSETRON 4 MG PO TBDP
4.0000 mg | ORAL_TABLET | Freq: Three times a day (TID) | ORAL | 3 refills | Status: DC | PRN
  Filled 2023-02-04: qty 20, 7d supply, fill #0
  Filled 2023-03-04: qty 20, 7d supply, fill #1
  Filled 2023-04-04: qty 9, 3d supply, fill #2
  Filled 2023-05-30: qty 9, 3d supply, fill #3
  Filled 2023-06-29: qty 9, 3d supply, fill #4
  Filled 2023-09-03: qty 9, 3d supply, fill #5
  Filled 2023-10-01: qty 9, 3d supply, fill #6

## 2023-02-04 MED ORDER — RIZATRIPTAN BENZOATE 10 MG PO TBDP
10.0000 mg | ORAL_TABLET | ORAL | 0 refills | Status: DC | PRN
  Filled 2023-02-04: qty 10, 30d supply, fill #0

## 2023-02-04 MED ORDER — BUTALBITAL-APAP-CAFFEINE 50-325-40 MG PO TABS
1.0000 | ORAL_TABLET | Freq: Four times a day (QID) | ORAL | 5 refills | Status: DC | PRN
  Filled 2023-02-04: qty 75, 19d supply, fill #0
  Filled 2023-03-04: qty 75, 19d supply, fill #1
  Filled 2023-04-04: qty 75, 19d supply, fill #2
  Filled 2023-05-01: qty 75, 19d supply, fill #3
  Filled 2023-05-30: qty 75, 19d supply, fill #4
  Filled 2023-06-29: qty 75, 19d supply, fill #5

## 2023-02-04 NOTE — Telephone Encounter (Signed)
Please advise on refill request   Allergies  Allergen Reactions   Oxaprozin Hives and Itching    "daypro"     Current Outpatient Medications:    albuterol (VENTOLIN HFA) 108 (90 Base) MCG/ACT inhaler, INHALE 2 PUFFS INTO THE LUNGS EVERY 4 HOURS AS NEEDED FOR WHEEZING OR SHORTNESS OF BREATH, Disp: 18 g, Rfl: 3   azelastine (ASTELIN) 0.1 % nasal spray, Spray 1 spray into each nostril 2 (two) times daily as directed., Disp: 30 mL, Rfl: 12   butalbital-acetaminophen-caffeine (FIORICET) 50-325-40 MG tablet, Take 1 tablet by mouth every 6 (six) hours as needed for headache., Disp: 75 tablet, Rfl: 5   celecoxib (CELEBREX) 100 MG capsule, Take 1 capsule by mouth 2 times daily., Disp: 60 capsule, Rfl: 5   esomeprazole (NEXIUM) 40 MG capsule, Take 1 capsule by mouth daily., Disp: 90 capsule, Rfl: 3   estrogens, conjugated, (PREMARIN) 0.625 MG tablet, Take 1 tablet (0.625 mg total) by mouth daily., Disp: 90 tablet, Rfl: 4   estrogens, conjugated, (PREMARIN) 0.9 MG tablet, Take 1 tablet (0.9 mg total) by mouth daily., Disp: 90 tablet, Rfl: 4   fexofenadine (ALLEGRA) 180 MG tablet, Take 1 tablet (180 mg total) by mouth daily., Disp: 90 tablet, Rfl: 4   fluticasone (FLONASE) 50 MCG/ACT nasal spray, Place 2 sprays into both nostrils daily., Disp: 16 g, Rfl: 6   linaclotide (LINZESS) 72 MCG capsule, Take 1 capsule (72 mcg total) by mouth in the morning before breakfast., Disp: 90 capsule, Rfl: 1   mirabegron ER (MYRBETRIQ) 25 MG TB24 tablet, Take 1 tablet (25 mg total) by mouth daily., Disp: 30 tablet, Rfl: 11   ondansetron (ZOFRAN-ODT) 4 MG disintegrating tablet, Take 1 tablet (4 mg total) by mouth every 8 (eight) hours as needed for nausea (migraines)., Disp: 20 tablet, Rfl: 3   oxybutynin (DITROPAN-XL) 5 MG 24 hr tablet, Take 1 tablet by mouth daily, Disp: 90 tablet, Rfl: 4   oxybutynin (DITROPAN-XL) 5 MG 24 hr tablet, Take 1 tablet (5 mg total) by mouth daily., Disp: 90 tablet, Rfl: 4   oxybutynin  (DITROPAN-XL) 5 MG 24 hr tablet, Take 1 tablet (5 mg total) by mouth daily., Disp: 90 tablet, Rfl: 4   pseudoephedrine (SUDAFED) 30 MG tablet, Per box as needed, Disp: , Rfl:    rizatriptan (MAXALT-MLT) 10 MG disintegrating tablet, Dissolve 1 tablet by mouth as needed for migraine. May repeat in 2 hours if needed, Disp: 10 tablet, Rfl: 0   sodium chloride (OCEAN) 0.65 % SOLN nasal spray, Place 1 spray into both nostrils as needed for congestion., Disp: 50 mL, Rfl: 0

## 2023-02-04 NOTE — Telephone Encounter (Signed)
Fioricet refilled

## 2023-02-05 ENCOUNTER — Other Ambulatory Visit: Payer: Self-pay

## 2023-02-24 ENCOUNTER — Other Ambulatory Visit: Payer: Self-pay

## 2023-03-03 ENCOUNTER — Other Ambulatory Visit (HOSPITAL_COMMUNITY): Payer: Self-pay

## 2023-03-03 ENCOUNTER — Telehealth: Payer: Commercial Managed Care - PPO | Admitting: Family Medicine

## 2023-03-03 ENCOUNTER — Other Ambulatory Visit: Payer: Self-pay

## 2023-03-03 DIAGNOSIS — R3989 Other symptoms and signs involving the genitourinary system: Secondary | ICD-10-CM | POA: Diagnosis not present

## 2023-03-03 MED ORDER — CEPHALEXIN 500 MG PO CAPS
500.0000 mg | ORAL_CAPSULE | Freq: Two times a day (BID) | ORAL | 0 refills | Status: AC
Start: 2023-03-03 — End: 2023-03-11
  Filled 2023-03-03: qty 14, 7d supply, fill #0

## 2023-03-03 NOTE — Progress Notes (Signed)

## 2023-03-04 ENCOUNTER — Other Ambulatory Visit (HOSPITAL_BASED_OUTPATIENT_CLINIC_OR_DEPARTMENT_OTHER): Payer: Self-pay

## 2023-03-04 ENCOUNTER — Other Ambulatory Visit: Payer: Self-pay | Admitting: Internal Medicine

## 2023-03-04 ENCOUNTER — Other Ambulatory Visit: Payer: Self-pay

## 2023-03-04 ENCOUNTER — Encounter: Payer: Self-pay | Admitting: Pharmacist

## 2023-03-04 DIAGNOSIS — Z1231 Encounter for screening mammogram for malignant neoplasm of breast: Secondary | ICD-10-CM

## 2023-03-04 MED ORDER — RIZATRIPTAN BENZOATE 10 MG PO TBDP
10.0000 mg | ORAL_TABLET | ORAL | 0 refills | Status: DC | PRN
  Filled 2023-03-04: qty 10, 30d supply, fill #0

## 2023-03-05 ENCOUNTER — Other Ambulatory Visit (HOSPITAL_COMMUNITY): Payer: Self-pay

## 2023-03-07 ENCOUNTER — Ambulatory Visit (HOSPITAL_BASED_OUTPATIENT_CLINIC_OR_DEPARTMENT_OTHER)
Admission: RE | Admit: 2023-03-07 | Discharge: 2023-03-07 | Disposition: A | Discharge status: Home / Self Care | Payer: Commercial Managed Care - PPO | Source: Ambulatory Visit | Attending: Internal Medicine | Admitting: Internal Medicine

## 2023-03-07 DIAGNOSIS — Z1231 Encounter for screening mammogram for malignant neoplasm of breast: Secondary | ICD-10-CM | POA: Diagnosis not present

## 2023-03-12 ENCOUNTER — Other Ambulatory Visit (HOSPITAL_COMMUNITY): Payer: Self-pay

## 2023-03-15 ENCOUNTER — Other Ambulatory Visit (HOSPITAL_COMMUNITY): Payer: Self-pay

## 2023-03-18 ENCOUNTER — Encounter: Payer: Self-pay | Admitting: Pharmacist

## 2023-03-18 ENCOUNTER — Other Ambulatory Visit: Payer: Self-pay

## 2023-03-19 ENCOUNTER — Other Ambulatory Visit: Payer: Self-pay

## 2023-03-28 ENCOUNTER — Other Ambulatory Visit (HOSPITAL_COMMUNITY): Payer: Self-pay

## 2023-04-04 ENCOUNTER — Other Ambulatory Visit: Payer: Self-pay

## 2023-04-04 ENCOUNTER — Other Ambulatory Visit: Payer: Self-pay | Admitting: Internal Medicine

## 2023-04-04 ENCOUNTER — Other Ambulatory Visit (HOSPITAL_COMMUNITY): Payer: Self-pay

## 2023-04-04 MED ORDER — RIZATRIPTAN BENZOATE 10 MG PO TBDP
10.0000 mg | ORAL_TABLET | ORAL | 0 refills | Status: DC | PRN
  Filled 2023-04-04: qty 10, 30d supply, fill #0

## 2023-04-09 ENCOUNTER — Other Ambulatory Visit (HOSPITAL_COMMUNITY): Payer: Self-pay

## 2023-04-09 MED ORDER — AMOXICILLIN 500 MG PO CAPS
1000.0000 mg | ORAL_CAPSULE | ORAL | 1 refills | Status: DC
  Filled 2023-04-09: qty 30, 9d supply, fill #0

## 2023-04-09 MED ORDER — CHLORHEXIDINE GLUCONATE 0.12 % MT SOLN
OROMUCOSAL | 0 refills | Status: DC
  Filled 2023-04-09: qty 473, 15d supply, fill #0

## 2023-05-01 ENCOUNTER — Other Ambulatory Visit (HOSPITAL_COMMUNITY): Payer: Self-pay

## 2023-05-05 ENCOUNTER — Other Ambulatory Visit (HOSPITAL_COMMUNITY): Payer: Self-pay

## 2023-05-05 MED ORDER — AMOXICILLIN 500 MG PO CAPS
500.0000 mg | ORAL_CAPSULE | Freq: Every day | ORAL | 1 refills | Status: DC
  Filled 2023-05-05 (×2): qty 30, 10d supply, fill #0

## 2023-05-05 MED ORDER — TRAMADOL HCL 50 MG PO TABS
50.0000 mg | ORAL_TABLET | ORAL | 1 refills | Status: DC | PRN
  Filled 2023-05-05 (×2): qty 12, 2d supply, fill #0
  Filled 2023-05-30: qty 12, 2d supply, fill #1

## 2023-05-12 ENCOUNTER — Other Ambulatory Visit: Payer: Self-pay

## 2023-05-12 ENCOUNTER — Other Ambulatory Visit: Payer: Self-pay | Admitting: Internal Medicine

## 2023-05-12 MED ORDER — RIZATRIPTAN BENZOATE 10 MG PO TBDP
10.0000 mg | ORAL_TABLET | ORAL | 0 refills | Status: DC | PRN
  Filled 2023-05-12: qty 10, 30d supply, fill #0

## 2023-05-30 ENCOUNTER — Other Ambulatory Visit: Payer: Self-pay

## 2023-05-30 ENCOUNTER — Other Ambulatory Visit: Payer: Self-pay | Admitting: Internal Medicine

## 2023-05-30 ENCOUNTER — Other Ambulatory Visit (HOSPITAL_COMMUNITY): Payer: Self-pay

## 2023-05-30 MED ORDER — RIZATRIPTAN BENZOATE 10 MG PO TBDP
10.0000 mg | ORAL_TABLET | ORAL | 0 refills | Status: DC | PRN
  Filled 2023-05-30 – 2023-06-26 (×2): qty 10, 30d supply, fill #0

## 2023-06-26 ENCOUNTER — Other Ambulatory Visit (HOSPITAL_COMMUNITY): Payer: Self-pay

## 2023-06-26 ENCOUNTER — Encounter (HOSPITAL_COMMUNITY): Payer: Self-pay | Admitting: Pharmacist

## 2023-06-26 ENCOUNTER — Other Ambulatory Visit: Payer: Self-pay

## 2023-06-29 ENCOUNTER — Other Ambulatory Visit (HOSPITAL_COMMUNITY): Payer: Self-pay

## 2023-06-30 ENCOUNTER — Other Ambulatory Visit (HOSPITAL_COMMUNITY): Payer: Self-pay

## 2023-06-30 MED ORDER — ESTRADIOL 1 MG PO TABS
1.5000 mg | ORAL_TABLET | Freq: Every day | ORAL | 4 refills | Status: DC
  Filled 2023-06-30: qty 135, 90d supply, fill #0

## 2023-07-01 ENCOUNTER — Other Ambulatory Visit: Payer: Self-pay

## 2023-07-02 ENCOUNTER — Other Ambulatory Visit (HOSPITAL_COMMUNITY): Payer: Self-pay

## 2023-07-06 ENCOUNTER — Ambulatory Visit
Admission: EM | Admit: 2023-07-06 | Discharge: 2023-07-06 | Disposition: A | Discharge status: Home / Self Care | Payer: Managed Care, Other (non HMO) | Attending: Internal Medicine | Admitting: Internal Medicine

## 2023-07-06 DIAGNOSIS — J209 Acute bronchitis, unspecified: Secondary | ICD-10-CM

## 2023-07-06 MED ORDER — PREDNISONE 20 MG PO TABS: ORAL_TABLET | ORAL | 0 refills | Status: DC

## 2023-07-06 MED ORDER — PROMETHAZINE-DM 6.25-15 MG/5ML PO SYRP: 5.0000 mL | ORAL_SOLUTION | Freq: Three times a day (TID) | ORAL | 0 refills | Status: DC | PRN

## 2023-07-06 NOTE — Discharge Instructions (Addendum)
I am managing your illness for bronchitis with prednisone.  Take this for 5 days as it can really help with the inflammation in your bronchi and your coughing fits.  They can also help you with your sinuses.  Use the cough syrup as needed.  Keep taking her Zyrtec.  Push fluids and take Tylenol where you need to for any aches pains or fevers.

## 2023-07-06 NOTE — ED Provider Notes (Signed)
[facility]  Note:  This document was prepared using Conservation officer, historic buildings and may include unintentional dictation errors.  MRN: [id number] DOB: 1961/03/13  Subjective:   Mindy [name] is a 62 y.o. female presenting for 5-day history of acute onset persistent hacking cough, mid chest discomfort over rhonchi, sinus congestion and chest congestion.  Has a history of asthma as a child, bronchitis as an adult.  Typically has done well with prednisone.  Does not need an albuterol inhaler.  No current facility-administered medications for this encounter.  Current Outpatient Medications:    albuterol (VENTOLIN HFA) 108 (90 Base) MCG/ACT inhaler, INHALE 2 PUFFS INTO THE LUNGS EVERY 4 HOURS AS NEEDED FOR WHEEZING OR SHORTNESS OF BREATH, Disp: 18 g, Rfl: 3   amoxicillin (AMOXIL) 500 MG capsule, Take 2 capsules (1,000 mg total) by mouth now then 1 capsule 3 times a day until gone, Disp: 30 capsule, Rfl: 1   amoxicillin (AMOXIL) 500 MG capsule, Take 2 capsules by mouth now, then take 1 capsule 3 times daily until gone, Disp: 30 capsule, Rfl: 1   azelastine (ASTELIN) 0.1 % nasal spray, Spray 1 spray into each nostril 2 (two) times daily as directed., Disp: 30 mL, Rfl: 12   butalbital-acetaminophen-caffeine (FIORICET) 50-325-40 MG tablet, Take 1 tablet by mouth every 6 (six) hours as needed for headache., Disp: 75 tablet, Rfl: 5   celecoxib (CELEBREX) 100 MG capsule, Take 1 capsule by mouth 2 times daily., Disp: 60 capsule, Rfl: 5   chlorhexidine (PERIDEX) 0.12 % solution, RINSE 1/2 OZ. TWICE DAILY AFTER BREAKFAST AND BEFORE BEDTIME, Disp: 473 mL, Rfl: 0   esomeprazole (NEXIUM) 40 MG capsule, Take 1 capsule by mouth daily., Disp: 90 capsule, Rfl: 3   estradiol (ESTRACE) 1 MG tablet, Take 1.5 tablets (1.5 mg total) by mouth daily., Disp: 135 tablet, Rfl: 4   estrogens, conjugated, (PREMARIN) 0.625 MG tablet, Take 1 tablet (0.625 mg total) by mouth daily., Disp: 90 tablet,  Rfl: 4   estrogens, conjugated, (PREMARIN) 0.9 MG tablet, Take 1 tablet (0.9 mg total) by mouth daily., Disp: 90 tablet, Rfl: 4   fexofenadine (ALLEGRA) 180 MG tablet, Take 1 tablet (180 mg total) by mouth daily., Disp: 90 tablet, Rfl: 4   fluticasone (FLONASE) 50 MCG/ACT nasal spray, Place 2 sprays into both nostrils daily., Disp: 16 g, Rfl: 6   linaclotide (LINZESS) 72 MCG capsule, Take 1 capsule (72 mcg total) by mouth in the morning before breakfast., Disp: 90 capsule, Rfl: 1   mirabegron ER (MYRBETRIQ) 25 MG TB24 tablet, Take 1 tablet (25 mg total) by mouth daily., Disp: 30 tablet, Rfl: 11   ondansetron (ZOFRAN-ODT) 4 MG disintegrating tablet, Dissolve 1 tablet (4 mg total) by mouth every 8 (eight) hours as needed for nausea (migraines)., Disp: 20 tablet, Rfl: 3   oxybutynin (DITROPAN-XL) 5 MG 24 hr tablet, Take 1 tablet by mouth daily, Disp: 90 tablet, Rfl: 4   oxybutynin (DITROPAN-XL) 5 MG 24 hr tablet, Take 1 tablet (5 mg total) by mouth daily., Disp: 90 tablet, Rfl: 4   oxybutynin (DITROPAN-XL) 5 MG 24 hr tablet, Take 1 tablet (5 mg total) by mouth daily., Disp: 90 tablet, Rfl: 4   pseudoephedrine (SUDAFED) 30 MG tablet, Per box as needed, Disp: , Rfl:    rizatriptan (MAXALT-MLT) 10 MG disintegrating tablet, Dissolve 1 tablet by mouth as needed for migraine. May repeat in 2 hours if needed, Disp: 10 tablet, Rfl: 0   sodium chloride (OCEAN)  0.65 % SOLN nasal spray, Place 1 spray into both nostrils as needed for congestion., Disp: 50 mL, Rfl: 0   traMADol (ULTRAM) 50 MG tablet, Take 1 tablet (50 mg total) by mouth every 4 (four) to 6 (six) hours as needed for pain., Disp: 12 tablet, Rfl: 1   Allergies  Allergen Reactions   Oxaprozin Hives and Itching    "daypro"    Past Medical History:  Diagnosis Date   Allergic rhinitis    Complication of anesthesia    Elevated transaminase level 12.2014   ?etiology - present 09/2013 hosp for epigastric pain   Family history of adverse reaction to  anesthesia    father - PONV   GERD (gastroesophageal reflux disease)    History of bronchitis 10/2014   Migraine    PONV (postoperative nausea and vomiting)    Seasonal allergies      Past Surgical History:  Procedure Laterality Date   BREAST BIOPSY Right    BREAST EXCISIONAL BIOPSY Right 2017   BREAST LUMPECTOMY WITH RADIOACTIVE SEED LOCALIZATION Right 10/08/2016   Procedure: RIGHT BREAST LUMPECTOMY WITH RADIOACTIVE SEED LOCALIZATION;  Surgeon: Abigail Miyamoto, MD;  Location: [facility] OR;  Service: General;  Laterality: Right;   CHOLECYSTECTOMY N/A 09/21/2015   Procedure: LAPAROSCOPIC CHOLECYSTECTOMY;  Surgeon: Abigail Miyamoto, MD;  Location: [facility] OR;  Service: General;  Laterality: N/A;   COLONOSCOPY  12/2013   NECK SURGERY  2009   PARTIAL HYSTERECTOMY  2007   UPPER GI ENDOSCOPY  2016    Family History  Problem Relation Age of Onset   Hypertension Mother    Dementia Mother    Atrial fibrillation Father    Diabetes type II Father    Hypertension Father    Coronary artery disease Father 56       stent x 2, AoVR   Hypertension Brother    Breast cancer Maternal Aunt    Breast cancer Maternal Grandmother    Breast cancer Paternal Grandmother     Social History   Tobacco Use   Smoking status: Never   Smokeless tobacco: Never  Vaping Use   Vaping status: Never Used  Substance Use Topics   Alcohol use: Not Currently   Drug use: No    ROS   Objective:   Vitals: BP 120/82 (BP Location: Right Arm)   Pulse 70   Temp 98.8 F (37.1 C) (Oral)   Resp 20   SpO2 98%   Physical Exam Constitutional:      General: She is not in acute distress.    Appearance: Normal appearance. She is well-developed. She is not ill-appearing, toxic-appearing or diaphoretic.  HENT:     Head: Normocephalic and atraumatic.     Nose: Nose normal.     Mouth/Throat:     Mouth: Mucous membranes are moist.     Pharynx: Posterior oropharyngeal erythema (with associated postnasal drainage streaks  overlying pharynx) present. No oropharyngeal exudate.  Eyes:     General: No scleral icterus.       Right eye: No discharge.        Left eye: No discharge.     Extraocular Movements: Extraocular movements intact.  Cardiovascular:     Rate and Rhythm: Normal rate and regular rhythm.     Heart sounds: Normal heart sounds. No murmur heard.    No friction rub. No gallop.  Pulmonary:     Effort: Pulmonary effort is normal. No respiratory distress.     Breath sounds: No stridor. No wheezing,  rhonchi or rales.  Chest:     Chest wall: No tenderness.  Skin:    General: Skin is warm and dry.  Neurological:     General: No focal deficit present.     Mental Status: She is alert and oriented to person, place, and time.  Psychiatric:        Mood and Affect: Mood normal.        Behavior: Behavior normal.     Assessment and Plan :   PDMP not reviewed this encounter.  1. Acute bronchitis, unspecified organism    Given timeline of illness, deferred COVID testing.  Will manage for an acute viral bronchitis with prednisone, maintain Zyrtec.  Use supportive care otherwise including cough syrup.  Deferred imaging given clear cardiopulmonary exam, hemodynamically stable vital signs. Counseled patient on potential for adverse effects with medications prescribed/recommended today, ER and return-to-clinic precautions discussed, patient verbalized understanding.    Wallis Bamberg, [place] 07/06/23 [phone]

## 2023-07-06 NOTE — ED Triage Notes (Signed)
Pt c/o cough, head/chest congestion x 5 days-denies fever-using OTC cold meds-feels no better-NAD-steady gait

## 2023-07-25 ENCOUNTER — Encounter: Payer: Self-pay | Admitting: Internal Medicine

## 2023-07-25 NOTE — Telephone Encounter (Signed)
Mychart sent by pt:  Mindy [name] (supporting Waymon Budge, MD)2 hours ago (1:44 PM)    Would it be possible to request a refill on the butalbital-acetaminophen-caffeine 50-325-40 MG tablet to be sent to the [facility] out patient community pharmacy? Thank you, Mindy [name]   Dr. Maple Hudson, please advise.  Allergies  Allergen Reactions   Oxaprozin Hives and Itching    "daypro"     Current Outpatient Medications:    albuterol (VENTOLIN HFA) 108 (90 Base) MCG/ACT inhaler, INHALE 2 PUFFS INTO THE LUNGS EVERY 4 HOURS AS NEEDED FOR WHEEZING OR SHORTNESS OF BREATH, Disp: 18 g, Rfl: 3   amoxicillin (AMOXIL) 500 MG capsule, Take 2 capsules (1,000 mg total) by mouth now then 1 capsule 3 times a day until gone, Disp: 30 capsule, Rfl: 1   amoxicillin (AMOXIL) 500 MG capsule, Take 2 capsules by mouth now, then take 1 capsule 3 times daily until gone, Disp: 30 capsule, Rfl: 1   azelastine (ASTELIN) 0.1 % nasal spray, Spray 1 spray into each nostril 2 (two) times daily as directed., Disp: 30 mL, Rfl: 12   butalbital-acetaminophen-caffeine (FIORICET) 50-325-40 MG tablet, Take 1 tablet by mouth every 6 (six) hours as needed for headache., Disp: 75 tablet, Rfl: 5   celecoxib (CELEBREX) 100 MG capsule, Take 1 capsule by mouth 2 times daily., Disp: 60 capsule, Rfl: 5   chlorhexidine (PERIDEX) 0.12 % solution, RINSE 1/2 OZ. TWICE DAILY AFTER BREAKFAST AND BEFORE BEDTIME, Disp: 473 mL, Rfl: 0   esomeprazole (NEXIUM) 40 MG capsule, Take 1 capsule by mouth daily., Disp: 90 capsule, Rfl: 3   estradiol (ESTRACE) 1 MG tablet, Take 1.5 tablets (1.5 mg total) by mouth daily., Disp: 135 tablet, Rfl: 4   estrogens, conjugated, (PREMARIN) 0.625 MG tablet, Take 1 tablet (0.625 mg total) by mouth daily., Disp: 90 tablet, Rfl: 4   estrogens, conjugated, (PREMARIN) 0.9 MG tablet, Take 1 tablet (0.9 mg total) by mouth daily., Disp: 90 tablet, Rfl: 4   fexofenadine (ALLEGRA) 180 MG tablet, Take 1  tablet (180 mg total) by mouth daily., Disp: 90 tablet, Rfl: 4   fluticasone (FLONASE) 50 MCG/ACT nasal spray, Place 2 sprays into both nostrils daily., Disp: 16 g, Rfl: 6   linaclotide (LINZESS) 72 MCG capsule, Take 1 capsule (72 mcg total) by mouth in the morning before breakfast., Disp: 90 capsule, Rfl: 1   mirabegron ER (MYRBETRIQ) 25 MG TB24 tablet, Take 1 tablet (25 mg total) by mouth daily., Disp: 30 tablet, Rfl: 11   ondansetron (ZOFRAN-ODT) 4 MG disintegrating tablet, Dissolve 1 tablet (4 mg total) by mouth every 8 (eight) hours as needed for nausea (migraines)., Disp: 20 tablet, Rfl: 3   oxybutynin (DITROPAN-XL) 5 MG 24 hr tablet, Take 1 tablet by mouth daily, Disp: 90 tablet, Rfl: 4   oxybutynin (DITROPAN-XL) 5 MG 24 hr tablet, Take 1 tablet (5 mg total) by mouth daily., Disp: 90 tablet, Rfl: 4   oxybutynin (DITROPAN-XL) 5 MG 24 hr tablet, Take 1 tablet (5 mg total) by mouth daily., Disp: 90 tablet, Rfl: 4   predniSONE (DELTASONE) 20 MG tablet, Take 2 tablets daily with breakfast., Disp: 10 tablet, Rfl: 0   promethazine-dextromethorphan (PROMETHAZINE-DM) 6.25-15 MG/5ML syrup, Take 5 mLs by mouth 3 (three) times daily as needed for cough., Disp: 200 mL, Rfl: 0   pseudoephedrine (SUDAFED) 30 MG tablet, Per box as needed, Disp: , Rfl:    rizatriptan (MAXALT-MLT) 10 MG disintegrating tablet, Dissolve  1 tablet by mouth as needed for migraine. May repeat in 2 hours if needed, Disp: 10 tablet, Rfl: 0   sodium chloride (OCEAN) 0.65 % SOLN nasal spray, Place 1 spray into both nostrils as needed for congestion., Disp: 50 mL, Rfl: 0   traMADol (ULTRAM) 50 MG tablet, Take 1 tablet (50 mg total) by mouth every 4 (four) to 6 (six) hours as needed for pain., Disp: 12 tablet, Rfl: 1

## 2023-07-26 ENCOUNTER — Other Ambulatory Visit (HOSPITAL_COMMUNITY): Payer: Self-pay

## 2023-07-26 MED ORDER — BUTALBITAL-APAP-CAFFEINE 50-325-40 MG PO TABS
1.0000 | ORAL_TABLET | Freq: Four times a day (QID) | ORAL | 5 refills | Status: DC | PRN
  Filled 2023-07-26: qty 75, 19d supply, fill #0
  Filled 2023-08-26: qty 75, 19d supply, fill #1
  Filled 2023-09-23: qty 75, 19d supply, fill #2
  Filled 2023-10-24: qty 75, 19d supply, fill #3
  Filled 2023-11-23: qty 75, 19d supply, fill #4
  Filled 2023-12-21: qty 75, 19d supply, fill #5

## 2023-07-26 NOTE — Telephone Encounter (Signed)
Refill sent as requested. 

## 2023-07-29 ENCOUNTER — Other Ambulatory Visit (HOSPITAL_COMMUNITY): Payer: Self-pay

## 2023-07-29 ENCOUNTER — Other Ambulatory Visit: Payer: Self-pay

## 2023-07-29 MED ORDER — RIZATRIPTAN BENZOATE 10 MG PO TBDP
10.0000 mg | ORAL_TABLET | ORAL | 11 refills | Status: DC | PRN
  Filled 2023-07-29: qty 10, 30d supply, fill #0
  Filled 2023-09-03: qty 10, 30d supply, fill #1
  Filled 2023-10-01: qty 10, 30d supply, fill #2
  Filled 2023-11-06: qty 10, 30d supply, fill #3
  Filled 2023-12-11: qty 10, 30d supply, fill #4
  Filled 2024-01-07: qty 10, 30d supply, fill #5
  Filled 2024-02-07: qty 10, 30d supply, fill #6
  Filled 2024-03-10: qty 10, 30d supply, fill #7
  Filled 2024-04-13: qty 10, 30d supply, fill #8
  Filled 2024-05-20: qty 10, 30d supply, fill #9
  Filled 2024-07-01: qty 10, 30d supply, fill #10

## 2023-07-31 ENCOUNTER — Other Ambulatory Visit (HOSPITAL_COMMUNITY): Payer: Self-pay

## 2023-07-31 MED ORDER — PREMARIN 0.9 MG PO TABS
0.9000 mg | ORAL_TABLET | Freq: Every day | ORAL | 4 refills | Status: DC
  Filled 2023-07-31: qty 90, 90d supply, fill #0

## 2023-08-01 ENCOUNTER — Other Ambulatory Visit (HOSPITAL_COMMUNITY): Payer: Self-pay

## 2023-08-01 ENCOUNTER — Other Ambulatory Visit (HOSPITAL_BASED_OUTPATIENT_CLINIC_OR_DEPARTMENT_OTHER): Payer: Self-pay

## 2023-08-01 MED ORDER — INFLUENZA VIRUS VACC SPLIT PF (FLUZONE) 0.5 ML IM SUSY
0.5000 mL | PREFILLED_SYRINGE | Freq: Once | INTRAMUSCULAR | 0 refills | Status: AC
  Filled 2023-08-01: qty 0.5, 1d supply, fill #0

## 2023-08-13 NOTE — Progress Notes (Signed)
[facility]  Patient ID: Mindy [name], female    DOB: 03-23-1961, 62 y.o.   MRN: [id number]  HPI female never smoker followed for chronic rhinitis/sinusitis, complicated by headache Allergy profile 12/27/13- Neg, Total IgE 4.5 Had failed allergy vaccine Eos not elevated 09/12/14 Sinus xray 12/12/2015-no evidence of acute sinusitis, clear ------------------------------------------------------------------- 08/12/22-  62 year old female never smoker followed for chronic rhinitis/sinusitis,Insomnia,  complicated by Migraine, superficial thrombophlebitis, Cervical disc disease, GERD,  -Fioricet, sudafed, Ventolin hfa, Flonase/ Astelin or Dymista,       Relpax, Norco, Robaxin, -Diclofenac, Pamelor,  Covid vax- 5 Phizer Flu vax-had ED 5/21- acute max sinusitis> augmentin, allegra, prednisone Pt states she is doing okay. Minor sneeze, postnasal drip currently. Asks script for Allegra= cheaper. Asks refill Fioricet for recurrent headache. No wheezew or cough. Rare use of rescue inhaler. Being followed by cardiology for tachycardia.  08/14/23- 62 year old female never smoker followed for chronic rhinitis/sinusitis,Insomnia,  complicated by Migraine, superficial thrombophlebitis, Cervical disc disease, GERD,  -Fioricet, sudafed, Ventolin hfa, Flonase/ Astelin or Dymista,  Relpax, Norco, Robaxin, -Diclofenac, Pamelor, ED 9/15- Asthmatic Bronchitis > prednisone was  -----Patient having headaches today - blames insurance change premarin to . Had flu vax. Occasional ventolin over past 6 months- no major exacerbation. Flonase and astelin usually sufficient for rhinitis.Fioricet usally works well for chronic intermittent headache.  Review of Systems- see HPI   +  = positive Constitutional:   No-   weight loss, night sweats, fevers, chills, fatigue, lassitude. HEENT:   +  headaches,  No-difficulty swallowing, tooth/dental problems, sore throat,       sneezing, itching, ear ache, +nasal congestion, post nasal  drip,  CV:  chest pain, orthopnea, PND, swelling in lower extremities, anasarca, dizziness, palpitations Resp: shortness of breath with exertion or at rest.              No-   productive cough,  non-productive cough,  No-  coughing up of blood.              No-   change in color of mucus.  No- wheezing.   Skin: No-   rash or lesions. GI:  No-   heartburn, indigestion, abdominal pain, nausea, vomiting,  GU:  MS:  No-   joint pain or swelling.   Neuro- nothing unusual Psych:  No- change in mood or affect. No depression or anxiety.  No memory loss.  Objective:   Physical Exam General- Alert, Oriented, Affect-appropriate, Distress- none  Skin- rash-none, lesions- none, excoriation- none Lymphadenopathy- none Head- atraumatic            Eyes- Gross vision intact, PERRLA, conjunctivae clear secretions            Ears- Hearing, canals            Nose-  +turbinate edema, +Septal dev, no-mucus,  No-polyps, erosion, perforation.               Throat- Mallampati III-IV , mucosa clear , drainage- none, tonsils- atrophic Neck- flexible , trachea midline, no stridor , thyroid nl, carotid no bruit Chest - symmetrical excursion , unlabored           Heart/CV- RRR , no murmur , no gallop  , no rub, nl s1 s2                           - JVD- none , edema- none, stasis changes- none, varices- none  Lung-  wheeze -none, cough-none , dullness-none, rub- none           Chest wall-  Abd-  Br/ Gen/ Rectal- Not done, not indicated Extrem- cyanosis- none, clubbing, none, atrophy- none, strength- nl Neuro- grossly intact to observation

## 2023-08-14 ENCOUNTER — Encounter: Payer: Self-pay | Admitting: Internal Medicine

## 2023-08-14 ENCOUNTER — Other Ambulatory Visit: Payer: Self-pay

## 2023-08-14 ENCOUNTER — Ambulatory Visit (INDEPENDENT_AMBULATORY_CARE_PROVIDER_SITE_OTHER): Payer: Managed Care, Other (non HMO) | Admitting: Internal Medicine

## 2023-08-14 ENCOUNTER — Other Ambulatory Visit (HOSPITAL_COMMUNITY): Payer: Self-pay

## 2023-08-14 VITALS — BP 118/76 | HR 78 | Ht 64.0 in | Wt 133.4 lb

## 2023-08-14 DIAGNOSIS — J4520 Mild intermittent asthma, uncomplicated: Secondary | ICD-10-CM

## 2023-08-14 DIAGNOSIS — G44219 Episodic tension-type headache, not intractable: Secondary | ICD-10-CM | POA: Diagnosis not present

## 2023-08-14 DIAGNOSIS — J300 Vasomotor rhinitis: Secondary | ICD-10-CM

## 2023-08-14 DIAGNOSIS — J019 Acute sinusitis, unspecified: Secondary | ICD-10-CM

## 2023-08-14 DIAGNOSIS — Z124 Encounter for screening for malignant neoplasm of cervix: Secondary | ICD-10-CM

## 2023-08-14 DIAGNOSIS — B9789 Other viral agents as the cause of diseases classified elsewhere: Secondary | ICD-10-CM

## 2023-08-14 MED ORDER — ALBUTEROL SULFATE HFA 108 (90 BASE) MCG/ACT IN AERS
2.0000 | INHALATION_SPRAY | RESPIRATORY_TRACT | 4 refills | Status: DC | PRN
  Filled 2023-08-14: qty 20.1, 51d supply, fill #0

## 2023-08-14 MED ORDER — AZELASTINE HCL 0.1 % NA SOLN
1.0000 | Freq: Two times a day (BID) | NASAL | 4 refills | Status: AC
  Filled 2023-08-14: qty 90, 90d supply, fill #0

## 2023-08-14 MED ORDER — FLUTICASONE PROPIONATE 50 MCG/ACT NA SUSP
2.0000 | Freq: Every day | NASAL | 6 refills | Status: AC
  Filled 2023-08-14: qty 16, 30d supply, fill #0

## 2023-08-14 NOTE — Patient Instructions (Signed)
l sent for flonase, albuterol and astelin  Please let me know if I can hep

## 2023-08-15 ENCOUNTER — Other Ambulatory Visit (HOSPITAL_COMMUNITY): Payer: Self-pay

## 2023-08-15 ENCOUNTER — Other Ambulatory Visit: Payer: Self-pay

## 2023-08-18 ENCOUNTER — Other Ambulatory Visit (HOSPITAL_COMMUNITY): Payer: Self-pay

## 2023-08-24 ENCOUNTER — Encounter: Payer: Self-pay | Admitting: Internal Medicine

## 2023-08-24 NOTE — Patient Instructions (Addendum)

## 2023-08-24 NOTE — Progress Notes (Signed)
Subjective:    Patient ID: Mindy [name], female    DOB: 1961/03/22, 62 y.o.   MRN: [id number]      HPI Mindy [name] is here for a Physical exam and her chronic medical problems.    Doing well.  Denies any changes in health.  No concerns.  Currently on premarin - does better with this - did not tolerate estradiol - made her palpitations worse and migraines worse.    Medications and allergies reviewed with patient and updated if appropriate.  Current Outpatient Medications on File Prior to Visit  Medication Sig Dispense Refill   albuterol (VENTOLIN HFA) 108 (90 Base) MCG/ACT inhaler INHALE 2 PUFFS INTO THE LUNGS EVERY 4 HOURS AS NEEDED FOR WHEEZING OR SHORTNESS OF BREATH 54 g 4   azelastine (ASTELIN) 0.1 % nasal spray Spray 1 spray into each nostril 2 (two) times daily as directed. 90 mL 4   butalbital-acetaminophen-caffeine (FIORICET) 50-325-40 MG tablet Take 1 tablet by mouth every 6 (six) hours as needed for headache. 75 tablet 5   celecoxib (CELEBREX) 100 MG capsule Take 1 capsule by mouth 2 times daily. 60 capsule 5   esomeprazole (NEXIUM) 40 MG capsule Take 1 capsule by mouth daily. 90 capsule 3   estrogens, conjugated, (PREMARIN) 0.9 MG tablet Take 1 tablet by mouth daily.     fexofenadine (ALLEGRA) 180 MG tablet Take 1 tablet (180 mg total) by mouth daily. 90 tablet 4   fluticasone (FLONASE) 50 MCG/ACT nasal spray Place 2 sprays into both nostrils daily. 16 g 6   linaclotide (LINZESS) 72 MCG capsule Take 1 capsule (72 mcg total) by mouth in the morning before breakfast. 90 capsule 1   mirabegron ER (MYRBETRIQ) 25 MG TB24 tablet Take 1 tablet (25 mg total) by mouth daily. 30 tablet 11   ondansetron (ZOFRAN-ODT) 4 MG disintegrating tablet Dissolve 1 tablet (4 mg total) by mouth every 8 (eight) hours as needed for nausea (migraines). 20 tablet 3   oxybutynin (DITROPAN-XL) 5 MG 24 hr tablet Take 1 tablet by mouth daily 90 tablet 4   pseudoephedrine (SUDAFED) 30 MG tablet Per box as  needed     rizatriptan (MAXALT-MLT) 10 MG disintegrating tablet Dissolve 1 tablet by mouth as needed for migraine. May repeat in 2 hours if needed 10 tablet 11   sodium chloride (OCEAN) 0.65 % SOLN nasal spray Place 1 spray into both nostrils as needed for congestion. 50 mL 0   No current facility-administered medications on file prior to visit.    Review of Systems  Constitutional:  Negative for fever.  Eyes:  Positive for visual disturbance (occular migraines occ).  Respiratory:  Negative for cough, shortness of breath and wheezing.   Cardiovascular:  Positive for palpitations (better with premarin). Negative for chest pain and leg swelling.  Gastrointestinal:  Negative for abdominal pain, blood in stool, constipation and diarrhea.       Occ gerd  Genitourinary:  Negative for dysuria.  Musculoskeletal:  Positive for arthralgias (medial right knee pain). Negative for back pain.  Skin:  Negative for rash.  Neurological:  Positive for headaches. Negative for dizziness and light-headedness.  Psychiatric/Behavioral:  Negative for dysphoric mood. The patient is not nervous/anxious.        Objective:   Vitals:   08/29/23 1340  BP: 112/72  Pulse: 79  Temp: 98.1 F (36.7 C)  SpO2: 98%   Filed Weights   08/29/23 1340  Weight: 145 lb (65.8 kg)   Body mass  index is 24.89 kg/m.  BP Readings from Last 3 Encounters:  08/29/23 112/72  08/14/23 118/76  07/06/23 120/82    Wt Readings from Last 3 Encounters:  08/29/23 145 lb (65.8 kg)  08/14/23 133 lb 6.4 oz (60.5 kg)  09/09/22 134 lb 12.8 oz (61.1 kg)       Physical Exam Constitutional: She appears well-developed and well-nourished. No distress.  HENT:  Head: Normocephalic and atraumatic.  Right Ear: External ear normal. Normal ear canal and TM Left Ear: External ear normal.  Normal ear canal and TM Mouth/Throat: Oropharynx is clear and moist.  Eyes: Conjunctivae normal.  Neck: Neck supple. No tracheal deviation present.  No thyromegaly present.  No carotid bruit  Cardiovascular: Normal rate, regular rhythm and normal heart sounds.   No murmur heard.  No edema. Pulmonary/Chest: Effort normal and breath sounds normal. No respiratory distress. She has no wheezes. She has no rales.  Breast: deferred   Abdominal: Soft. She exhibits no distension. There is no tenderness.  Lymphadenopathy: She has no cervical adenopathy.  Skin: Skin is warm and dry. She is not diaphoretic.  Psychiatric: She has a normal mood and affect. Her behavior is normal.     Lab Results  Component Value Date   WBC 4.5 08/23/2022   HGB 14.3 08/23/2022   HCT 41.2 08/23/2022   PLT 263.0 08/23/2022   GLUCOSE 84 08/23/2022   CHOL 207 (H) 08/23/2022   TRIG 106.0 08/23/2022   HDL 80.60 08/23/2022   LDLCALC 105 (H) 08/23/2022   ALT 10 08/23/2022   AST 13 08/23/2022   NA 135 08/23/2022   K 3.7 08/23/2022   CL 97 08/23/2022   CREATININE 0.81 08/23/2022   BUN 8 08/23/2022   CO2 30 08/23/2022   TSH 1.91 08/23/2022   INR 1.10 09/20/2013   HGBA1C 5.1 09/11/2020         Assessment & Plan:   Physical exam: Screening blood work  ordered Exercise  regular  Weight  is normal Substance abuse  none   Reviewed recommended immunizations.   Health Maintenance  Topic Date Due   Cervical Cancer Screening (HPV/Pap Cotest)  10/20/2015   COVID-19 Vaccine (6 - 2023-24 season) 09/14/2023 (Originally 06/22/2023)   Colonoscopy  01/04/2024   MAMMOGRAM  03/06/2025   DTaP/Tdap/Td (2 - Td or Tdap) 09/11/2030   INFLUENZA VACCINE  Completed   Hepatitis C Screening  Completed   HIV Screening  Completed   Zoster Vaccines- Shingrix  Completed   HPV VACCINES  Aged Out          See Problem List for Assessment and Plan of chronic medical problems.

## 2023-08-26 ENCOUNTER — Other Ambulatory Visit (HOSPITAL_COMMUNITY): Payer: Self-pay

## 2023-08-29 ENCOUNTER — Ambulatory Visit (INDEPENDENT_AMBULATORY_CARE_PROVIDER_SITE_OTHER): Payer: Managed Care, Other (non HMO) | Admitting: Internal Medicine

## 2023-08-29 VITALS — BP 112/72 | HR 79 | Temp 98.1°F | Ht 64.0 in | Wt 145.0 lb

## 2023-08-29 DIAGNOSIS — K219 Gastro-esophageal reflux disease without esophagitis: Secondary | ICD-10-CM

## 2023-08-29 DIAGNOSIS — K5904 Chronic idiopathic constipation: Secondary | ICD-10-CM

## 2023-08-29 DIAGNOSIS — G43709 Chronic migraine without aura, not intractable, without status migrainosus: Secondary | ICD-10-CM

## 2023-08-29 DIAGNOSIS — Z0000 Encounter for general adult medical examination without abnormal findings: Secondary | ICD-10-CM

## 2023-08-29 LAB — COMPREHENSIVE METABOLIC PANEL
ALT: 10 U/L (ref 0–35)
AST: 12 U/L (ref 0–37)
Albumin: 4.5 g/dL (ref 3.5–5.2)
Alkaline Phosphatase: 71 U/L (ref 39–117)
BUN: 9 mg/dL (ref 6–23)
CO2: 29 meq/L (ref 19–32)
Calcium: 9.4 mg/dL (ref 8.4–10.5)
Chloride: 98 meq/L (ref 96–112)
Creatinine, Ser: 0.8 mg/dL (ref 0.40–1.20)
GFR: 80.18 mL/min (ref >60.00)
Glucose, Bld: 83 mg/dL (ref 70–99)
Potassium: 4.2 meq/L (ref 3.5–5.1)
Sodium: 135 meq/L (ref 135–145)
Total Bilirubin: 0.4 mg/dL (ref 0.2–1.2)
Total Protein: 7.1 g/dL (ref 6.0–8.3)

## 2023-08-29 LAB — CBC WITH DIFFERENTIAL/PLATELET
Basophils Absolute: 0 10*3/uL (ref 0.0–0.1)
Basophils Relative: 0.6 % (ref 0.0–3.0)
Eosinophils Absolute: 0 10*3/uL (ref 0.0–0.7)
Eosinophils Relative: 0.8 % (ref 0.0–5.0)
HCT: 41.8 % (ref 36.0–46.0)
Hemoglobin: 14.4 g/dL (ref 12.0–15.0)
Lymphocytes Relative: 33 % (ref 12.0–46.0)
Lymphs Abs: 1.8 10*3/uL (ref 0.7–4.0)
MCHC: 34.3 g/dL (ref 30.0–36.0)
MCV: 95.5 fL (ref 78.0–100.0)
Monocytes Absolute: 0.5 10*3/uL (ref 0.1–1.0)
Monocytes Relative: 8.5 % (ref 3.0–12.0)
Neutro Abs: 3.2 10*3/uL (ref 1.4–7.7)
Neutrophils Relative %: 57.1 % (ref 43.0–77.0)
Platelets: 296 10*3/uL (ref 150.0–400.0)
RBC: 4.38 Mil/uL (ref 3.87–5.11)
RDW: 12.9 % (ref 11.5–15.5)
WBC: 5.5 10*3/uL (ref 4.0–10.5)

## 2023-08-29 LAB — LIPID PANEL
Cholesterol: 239 mg/dL — ABNORMAL HIGH (ref 0–200)
HDL: 72.8 mg/dL (ref >39.00)
LDL Cholesterol: 145 mg/dL — ABNORMAL HIGH (ref 0–99)
NonHDL: 166.32
Total CHOL/HDL Ratio: 3
Triglycerides: 109 mg/dL (ref 0.0–149.0)
VLDL: 21.8 mg/dL (ref 0.0–40.0)

## 2023-08-29 LAB — TSH: TSH: 2.73 u[IU]/mL (ref 0.35–5.50)

## 2023-08-29 NOTE — Assessment & Plan Note (Addendum)
Chronic Better since having had covid Controlled, Stable Continue Linzess 72 mcg daily prn

## 2023-08-29 NOTE — Assessment & Plan Note (Signed)
Chronic GERD controlled Continue Nexium 40 mg daily prn

## 2023-08-29 NOTE — Assessment & Plan Note (Addendum)
Chronic Worse on estradiol -- better with premarin Taking Maxalt 10 mg as needed for migraines, takes Fioricet as needed Continue above

## 2023-09-04 ENCOUNTER — Other Ambulatory Visit: Payer: Self-pay

## 2023-09-12 ENCOUNTER — Encounter: Payer: Self-pay | Admitting: Internal Medicine

## 2023-09-12 NOTE — Assessment & Plan Note (Signed)
Refilling Fioricet with discussion

## 2023-09-12 NOTE — Assessment & Plan Note (Signed)
Mild intermittent uncomplicated. Seasonal and weather change triggers. Very occasional use of albuterol rescue inhaler has been sufficient.

## 2023-09-12 NOTE — Assessment & Plan Note (Signed)
Refilling astelin, flonase. Seasonal intermittent use.

## 2023-09-23 ENCOUNTER — Other Ambulatory Visit: Payer: Self-pay

## 2023-10-01 ENCOUNTER — Other Ambulatory Visit: Payer: Self-pay | Admitting: Internal Medicine

## 2023-10-01 ENCOUNTER — Other Ambulatory Visit: Payer: Self-pay

## 2023-10-01 ENCOUNTER — Other Ambulatory Visit (HOSPITAL_COMMUNITY): Payer: Self-pay

## 2023-10-01 MED ORDER — ONDANSETRON 4 MG PO TBDP
4.0000 mg | ORAL_TABLET | Freq: Three times a day (TID) | ORAL | 3 refills | Status: DC | PRN
  Filled 2023-10-01: qty 9, 3d supply, fill #0
  Filled 2023-11-06: qty 9, 3d supply, fill #1
  Filled 2023-12-11: qty 9, 3d supply, fill #2
  Filled 2024-01-07: qty 9, 3d supply, fill #3
  Filled 2024-02-07: qty 9, 3d supply, fill #4
  Filled 2024-03-10: qty 9, 3d supply, fill #5
  Filled 2024-04-13: qty 9, 3d supply, fill #6
  Filled 2024-05-20: qty 9, 3d supply, fill #7
  Filled 2024-07-01: qty 8, 3d supply, fill #8

## 2023-10-01 MED ORDER — LINACLOTIDE 72 MCG PO CAPS
72.0000 ug | ORAL_CAPSULE | Freq: Every morning | ORAL | 3 refills | Status: AC
  Filled 2023-10-01: qty 90, 90d supply, fill #0
  Filled 2024-04-05: qty 90, 90d supply, fill #1
  Filled 2024-08-11: qty 90, 90d supply, fill #2

## 2023-10-21 ENCOUNTER — Telehealth: Payer: Managed Care, Other (non HMO) | Admitting: Physician Assistant

## 2023-10-21 DIAGNOSIS — J208 Acute bronchitis due to other specified organisms: Secondary | ICD-10-CM | POA: Diagnosis not present

## 2023-10-21 MED ORDER — ALBUTEROL SULFATE HFA 108 (90 BASE) MCG/ACT IN AERS: 1.0000 | INHALATION_SPRAY | Freq: Four times a day (QID) | RESPIRATORY_TRACT | 0 refills | Status: AC | PRN

## 2023-10-21 MED ORDER — PREDNISONE 10 MG (21) PO TBPK
ORAL_TABLET | ORAL | 0 refills | Status: DC
Start: 2023-10-21 — End: 2024-06-09

## 2023-10-21 MED ORDER — BENZONATATE 100 MG PO CAPS: 100.0000 mg | ORAL_CAPSULE | Freq: Three times a day (TID) | ORAL | 0 refills | Status: DC | PRN

## 2023-10-21 NOTE — Progress Notes (Signed)
 E-Visit for Cough   We are sorry that you are not feeling well.  Here is how we plan to help!  Based on your presentation I believe you most likely have A cough due to a virus.  This is called viral bronchitis and is best treated by rest, plenty of fluids and control of the cough.  You may use Ibuprofen or Tylenol as directed to help your symptoms.     In addition you may use A non-prescription cough medication called Mucinex DM: take 2 tablets every 12 hours. and A prescription cough medication called Tessalon Perles 100mg . You may take 1-2 capsules every 8 hours as needed for your cough.  I have prescribed:  Prednisone 10 mg daily for 6 days (see taper instructions below)  Directions for 6 day taper: Day 1: 2 tablets before breakfast, 1 after both lunch & dinner and 2 at bedtime Day 2: 1 tab before breakfast, 1 after both lunch & dinner and 2 at bedtime Day 3: 1 tab at each meal & 1 at bedtime Day 4: 1 tab at breakfast, 1 at lunch, 1 at bedtime Day 5: 1 tab at breakfast & 1 tab at bedtime Day 6: 1 tab at breakfast  AND  Albuterol inhaler Use 1-2 puffs every 6 hours as needed for shortness of breath, chest tightness, and/or wheezing.  From your responses in the eVisit questionnaire you describe inflammation in the upper respiratory tract which is causing a significant cough.  This is commonly called Bronchitis and has four common causes:   Allergies Viral Infections Acid Reflux Bacterial Infection Allergies, viruses and acid reflux are treated by controlling symptoms or eliminating the cause. An example might be a cough caused by taking certain blood pressure medications. You stop the cough by changing the medication. Another example might be a cough caused by acid reflux. Controlling the reflux helps control the cough.  USE OF BRONCHODILATOR ("RESCUE") INHALERS: There is a risk from using your bronchodilator too frequently.  The risk is that over-reliance on a medication which only  relaxes the muscles surrounding the breathing tubes can reduce the effectiveness of medications prescribed to reduce swelling and congestion of the tubes themselves.  Although you feel brief relief from the bronchodilator inhaler, your asthma may actually be worsening with the tubes becoming more swollen and filled with mucus.  This can delay other crucial treatments, such as oral steroid medications. If you need to use a bronchodilator inhaler daily, several times per day, you should discuss this with your provider.  There are probably better treatments that could be used to keep your asthma under control.     HOME CARE Only take medications as instructed by your medical team. Complete the entire course of an antibiotic. Drink plenty of fluids and get plenty of rest. Avoid close contacts especially the very young and the elderly Cover your mouth if you cough or cough into your sleeve. Always remember to wash your hands A steam or ultrasonic humidifier can help congestion.   GET HELP RIGHT AWAY IF: You develop worsening fever. You become short of breath You cough up blood. Your symptoms persist after you have completed your treatment plan MAKE SURE YOU  Understand these instructions. Will watch your condition. Will get help right away if you are not doing well or get worse.    Thank you for choosing an e-visit.  Your e-visit answers were reviewed by a board certified advanced clinical practitioner to complete your personal care plan. Depending  upon the condition, your plan could have included both over the counter or prescription medications.  Please review your pharmacy choice. Make sure the pharmacy is open so you can pick up prescription now. If there is a problem, you may contact your provider through [employer] and have the prescription routed to another pharmacy.  Your safety is important to [place]. If you have drug allergies check your prescription carefully.   For the next 24  hours you can use MyChart to ask questions about today's visit, request a non-urgent call back, or ask for a work or school excuse. You will get an email in the next two days asking about your experience. I hope that your e-visit has been valuable and will speed your recovery.   I have spent 5 minutes in review of e-visit questionnaire, review and updating patient chart, medical decision making and response to patient.   Margaretann Loveless, PA-C

## 2023-10-24 ENCOUNTER — Other Ambulatory Visit (HOSPITAL_COMMUNITY): Payer: Self-pay

## 2023-10-30 ENCOUNTER — Ambulatory Visit: Payer: Managed Care, Other (non HMO) | Admitting: Internal Medicine

## 2023-11-06 ENCOUNTER — Other Ambulatory Visit (HOSPITAL_COMMUNITY): Payer: Self-pay

## 2023-11-24 ENCOUNTER — Other Ambulatory Visit: Payer: Self-pay

## 2023-12-11 ENCOUNTER — Other Ambulatory Visit (HOSPITAL_COMMUNITY): Payer: Self-pay

## 2023-12-22 ENCOUNTER — Other Ambulatory Visit: Payer: Self-pay

## 2023-12-23 ENCOUNTER — Other Ambulatory Visit (HOSPITAL_COMMUNITY): Payer: Self-pay

## 2024-01-07 ENCOUNTER — Other Ambulatory Visit (HOSPITAL_COMMUNITY): Payer: Self-pay

## 2024-01-12 ENCOUNTER — Other Ambulatory Visit (HOSPITAL_COMMUNITY): Payer: Self-pay

## 2024-01-12 ENCOUNTER — Other Ambulatory Visit: Payer: Self-pay

## 2024-01-12 MED ORDER — OXYBUTYNIN CHLORIDE ER 5 MG PO TB24
5.0000 mg | ORAL_TABLET | Freq: Every day | ORAL | 4 refills | Status: DC
  Filled 2024-01-12 – 2024-01-14 (×2): qty 90, 90d supply, fill #0
  Filled 2024-04-13: qty 90, 90d supply, fill #1
  Filled 2024-08-11: qty 90, 90d supply, fill #2

## 2024-01-12 MED ORDER — ESTRADIOL 10 MCG VA TABS
1.0000 | ORAL_TABLET | VAGINAL | 4 refills | Status: DC
  Filled 2024-01-31: qty 24, 84d supply, fill #0
  Filled 2024-04-20: qty 24, 84d supply, fill #1

## 2024-01-12 MED ORDER — ESTRADIOL 1 MG PO TABS
1.5000 mg | ORAL_TABLET | Freq: Every day | ORAL | 4 refills | Status: DC
  Filled 2024-01-12 – 2024-01-14 (×2): qty 135, 90d supply, fill #0
  Filled 2024-06-20: qty 135, 90d supply, fill #1

## 2024-01-12 MED ORDER — ESTRADIOL 10 MCG VA TABS
10.0000 ug | ORAL_TABLET | Freq: Every day | VAGINAL | 0 refills | Status: AC
  Filled 2024-01-12: qty 14, 14d supply, fill #0
  Filled 2024-01-14: qty 16, 16d supply, fill #0
  Filled 2024-01-14: qty 16, 28d supply, fill #0

## 2024-01-13 ENCOUNTER — Other Ambulatory Visit: Payer: Self-pay

## 2024-01-13 ENCOUNTER — Encounter: Payer: Self-pay | Admitting: Pharmacist

## 2024-01-13 ENCOUNTER — Other Ambulatory Visit (HOSPITAL_COMMUNITY): Payer: Self-pay

## 2024-01-14 ENCOUNTER — Other Ambulatory Visit (HOSPITAL_COMMUNITY): Payer: Self-pay

## 2024-01-14 ENCOUNTER — Other Ambulatory Visit: Payer: Self-pay

## 2024-01-15 ENCOUNTER — Other Ambulatory Visit (HOSPITAL_COMMUNITY): Payer: Self-pay

## 2024-01-15 MED ORDER — ESTRADIOL 10 MCG VA TABS
10.0000 ug | ORAL_TABLET | Freq: Every day | VAGINAL | 0 refills | Status: AC
Start: 2024-01-15 — End: ?
  Filled 2024-04-19: qty 8, 90d supply, fill #0
  Filled 2024-06-20 – 2024-10-11 (×2): qty 8, 8d supply, fill #0

## 2024-01-21 ENCOUNTER — Encounter: Payer: Self-pay | Admitting: Internal Medicine

## 2024-01-22 ENCOUNTER — Other Ambulatory Visit: Payer: Self-pay

## 2024-01-22 MED ORDER — BUTALBITAL-APAP-CAFFEINE 50-325-40 MG PO TABS: 1.0000 | ORAL_TABLET | Freq: Four times a day (QID) | ORAL | 2 refills | Status: DC | PRN

## 2024-01-23 ENCOUNTER — Other Ambulatory Visit: Payer: Self-pay

## 2024-01-23 ENCOUNTER — Other Ambulatory Visit (HOSPITAL_COMMUNITY): Payer: Self-pay

## 2024-01-23 MED ORDER — BUTALBITAL-APAP-CAFFEINE 50-325-40 MG PO TABS
1.0000 | ORAL_TABLET | Freq: Four times a day (QID) | ORAL | 2 refills | Status: DC | PRN
  Filled 2024-01-23: qty 75, 19d supply, fill #0
  Filled 2024-05-20: qty 75, 19d supply, fill #1
  Filled 2024-06-20: qty 75, 19d supply, fill #2

## 2024-01-28 ENCOUNTER — Other Ambulatory Visit (HOSPITAL_COMMUNITY): Payer: Self-pay

## 2024-01-28 MED ORDER — BUTALBITAL-APAP-CAFFEINE 50-325-40 MG PO TABS
1.0000 | ORAL_TABLET | Freq: Four times a day (QID) | ORAL | 2 refills | Status: DC | PRN
Start: 2024-01-22 — End: 2024-08-19
  Filled 2024-01-28 – 2024-02-20 (×2): qty 75, 19d supply, fill #0
  Filled 2024-03-21: qty 75, 19d supply, fill #1
  Filled 2024-04-19: qty 75, 19d supply, fill #2

## 2024-01-31 ENCOUNTER — Other Ambulatory Visit (HOSPITAL_COMMUNITY): Payer: Self-pay

## 2024-01-31 ENCOUNTER — Other Ambulatory Visit (HOSPITAL_BASED_OUTPATIENT_CLINIC_OR_DEPARTMENT_OTHER): Payer: Self-pay

## 2024-02-02 ENCOUNTER — Other Ambulatory Visit: Payer: Self-pay

## 2024-02-07 ENCOUNTER — Other Ambulatory Visit (HOSPITAL_COMMUNITY): Payer: Self-pay

## 2024-02-20 ENCOUNTER — Other Ambulatory Visit (HOSPITAL_COMMUNITY): Payer: Self-pay

## 2024-02-20 ENCOUNTER — Other Ambulatory Visit: Payer: Self-pay

## 2024-03-11 ENCOUNTER — Other Ambulatory Visit (HOSPITAL_COMMUNITY): Payer: Self-pay

## 2024-03-22 ENCOUNTER — Other Ambulatory Visit (HOSPITAL_COMMUNITY): Payer: Self-pay

## 2024-04-06 ENCOUNTER — Other Ambulatory Visit: Payer: Self-pay

## 2024-04-13 ENCOUNTER — Other Ambulatory Visit: Payer: Self-pay

## 2024-04-20 ENCOUNTER — Other Ambulatory Visit: Payer: Self-pay

## 2024-04-20 ENCOUNTER — Other Ambulatory Visit (HOSPITAL_COMMUNITY): Payer: Self-pay

## 2024-05-21 ENCOUNTER — Other Ambulatory Visit: Payer: Self-pay

## 2024-06-09 NOTE — Patient Instructions (Incomplete)
      Medications changes include :   None    A referral was ordered and someone will call you to schedule an appointment.

## 2024-06-09 NOTE — Progress Notes (Signed)
 "   Subjective:    Patient ID: Mindy [name], female    DOB: 02-03-1961, 63 y.o.   MRN: [id number]      HPI [name] is here for  Chief Complaint  Patient presents with   Hand Pain    Right hand pain, discussed 1 year ago and pain has moved into the palm. Straightening hand is painful, occasionally feels tight. Has tried aleve and home stretches     Discussed the use of AI scribe software for clinical note transcription with the patient, who gave verbal consent to proceed.  History of Present Illness Mindy [name] is a 63 year old female who presents with right hand pain and swelling.  She initially noticed a small knot on her right hand during her physical last year, which has since expanded and feels hard and puffy.  Pain is experienced when extending her fingers, although stretching exercises have provided some relief. Touching her palm on surfaces, such as a steering wheel, causes significant pain. She has been using Aleve, but it has not alleviated the pain. The pain is localized to the right hand, specifically affecting one tendon, and is tender to touch. There is no involvement of the left hand. No tingling or loss of strength in the fingers, although there is increased pain after using the computer mouse with her right hand.  She has not tried heat, but ice has not been effective. She has a history of an allergic reaction to Daypro, but tolerates other anti-inflammatories if taken with food. Aleve causes stomach irritation if used for extended periods.        Medications and allergies reviewed with patient and updated if appropriate.  Current Outpatient Medications on File Prior to Visit  Medication Sig Dispense Refill   albuterol  (VENTOLIN  HFA) 108 (90 Base) MCG/ACT inhaler INHALE 2 PUFFS INTO THE LUNGS EVERY 4 HOURS AS NEEDED FOR WHEEZING OR SHORTNESS OF BREATH 54 g 4   albuterol  (VENTOLIN  HFA) 108 (90 Base) MCG/ACT inhaler Inhale 1-2 puffs into the lungs every 6 (six)  hours as needed. 8 g 0   azelastine  (ASTELIN ) 0.1 % nasal spray Spray 1 spray into each nostril 2 (two) times daily as directed. 90 mL 4   butalbital -acetaminophen -caffeine  (FIORICET ) 50-325-40 MG tablet Take 1 tablet by mouth every 6 (six) hours as needed for headache. 75 tablet 2   butalbital -acetaminophen -caffeine  (FIORICET ) 50-325-40 MG tablet Take 1 tablet by mouth every 6 (six) hours as needed for headache. 75 tablet 2   butalbital -acetaminophen -caffeine  (FIORICET ) 50-325-40 MG tablet Take 1 tablet by mouth every 6 (six) hours as needed for headache. 75 tablet 2   estradiol  (ESTRACE ) 1 MG tablet Take 1.5 tablets (1.5 mg total) by mouth daily. 135 tablet 4   Estradiol  10 MCG TABS vaginal tablet Insert 1 tablet twice a week by vaginal route at bedtime. 24 tablet 4   Estradiol  10 MCG TABS vaginal tablet Insert 1 tablet (10 mcg total) vaginally at bedtime for 8 days. 8 tablet 0   estrogens , conjugated, (PREMARIN ) 0.9 MG tablet Take 1 tablet by mouth daily.     fexofenadine  (ALLEGRA ) 180 MG tablet Take 1 tablet (180 mg total) by mouth daily. 90 tablet 4   fluticasone  (FLONASE ) 50 MCG/ACT nasal spray Place 2 sprays into both nostrils daily. 16 g 6   linaclotide  (LINZESS ) 72 MCG capsule Take 1 capsule (72 mcg total) by mouth in the morning before breakfast. 90 capsule 3   ondansetron  (ZOFRAN -ODT) 4 MG disintegrating tablet  Dissolve 1 tablet (4 mg total) by mouth every 8 (eight) hours as needed for nausea (migraines). 20 tablet 3   oxybutynin  (DITROPAN -XL) 5 MG 24 hr tablet Take 1 tablet by mouth daily 90 tablet 4   oxybutynin  (DITROPAN -XL) 5 MG 24 hr tablet Take 1 tablet (5 mg total) by mouth daily. 90 tablet 4   pseudoephedrine  (SUDAFED) 30 MG tablet Per box as needed     rizatriptan  (MAXALT -MLT) 10 MG disintegrating tablet Dissolve 1 tablet by mouth as needed for migraine. May repeat in 2 hours if needed 10 tablet 11   sodium chloride  (OCEAN) 0.65 % SOLN nasal spray Place 1 spray into both nostrils  as needed for congestion. 50 mL 0   No current facility-administered medications on file prior to visit.    Review of Systems     Objective:   Vitals:   06/10/24 0756  BP: 112/88  Pulse: 71  Temp: 98.1 F (36.7 C)  SpO2: 95%   BP Readings from Last 3 Encounters:  06/10/24 112/88  08/29/23 112/72  08/14/23 118/76   Wt Readings from Last 3 Encounters:  06/10/24 141 lb 6.4 oz (64.1 kg)  08/29/23 145 lb (65.8 kg)  08/14/23 133 lb 6.4 oz (60.5 kg)   Body mass index is 24.27 kg/m.    Physical Exam Constitutional:      General: She is not in acute distress.    Appearance: Normal appearance. She is not ill-appearing.  HENT:     Head: Normocephalic and atraumatic.  Musculoskeletal:     Comments: Dupuytren's contracture right palm extending toward ring finger.  It is tender to touch.  No surrounding erythema.  Normal range of motion of ring finger and hand  Skin:    General: Skin is warm and dry.     Findings: No erythema or rash.  Neurological:     Mental Status: She is alert.     Sensory: No sensory deficit.     Motor: No weakness.            Assessment & Plan:    See Problem List for Assessment and Plan of chronic medical problems.      "

## 2024-06-10 ENCOUNTER — Other Ambulatory Visit (HOSPITAL_COMMUNITY): Payer: Self-pay

## 2024-06-10 ENCOUNTER — Other Ambulatory Visit (HOSPITAL_BASED_OUTPATIENT_CLINIC_OR_DEPARTMENT_OTHER): Payer: Self-pay

## 2024-06-10 ENCOUNTER — Encounter: Payer: Self-pay | Admitting: Internal Medicine

## 2024-06-10 ENCOUNTER — Ambulatory Visit: Admitting: Internal Medicine

## 2024-06-10 VITALS — BP 112/88 | HR 71 | Temp 98.1°F | Ht 64.0 in | Wt 141.4 lb

## 2024-06-10 DIAGNOSIS — M720 Palmar fascial fibromatosis [Dupuytren]: Secondary | ICD-10-CM

## 2024-06-10 MED ORDER — MELOXICAM 15 MG PO TABS
15.0000 mg | ORAL_TABLET | Freq: Every day | ORAL | 0 refills | Status: DC
  Filled 2024-06-10: qty 30, 30d supply, fill #0

## 2024-06-10 MED ORDER — PREMARIN 0.9 MG PO TABS
0.9000 mg | ORAL_TABLET | Freq: Every day | ORAL | 4 refills | Status: DC
  Filled 2024-06-10 – 2024-06-14 (×3): qty 90, 90d supply, fill #0

## 2024-06-10 NOTE — Assessment & Plan Note (Signed)
 [facility]acute First noticed this several months ago-has gotten worse Painful with daily activities Likely still has good range of motion and denies any weakness Aleve has not been effective Start meloxicam  15 mg daily with food Referral to orthopedics-Dr. Kuzma

## 2024-06-11 ENCOUNTER — Other Ambulatory Visit (HOSPITAL_COMMUNITY): Payer: Self-pay

## 2024-06-11 ENCOUNTER — Other Ambulatory Visit: Payer: Self-pay

## 2024-06-14 ENCOUNTER — Other Ambulatory Visit: Payer: Self-pay

## 2024-06-15 ENCOUNTER — Other Ambulatory Visit: Payer: Self-pay

## 2024-06-15 ENCOUNTER — Other Ambulatory Visit (HOSPITAL_COMMUNITY): Payer: Self-pay

## 2024-06-21 ENCOUNTER — Other Ambulatory Visit: Payer: Self-pay

## 2024-06-22 ENCOUNTER — Other Ambulatory Visit: Payer: Self-pay

## 2024-07-01 ENCOUNTER — Other Ambulatory Visit: Payer: Self-pay

## 2024-07-18 ENCOUNTER — Other Ambulatory Visit: Payer: Self-pay | Admitting: Internal Medicine

## 2024-07-18 ENCOUNTER — Other Ambulatory Visit (HOSPITAL_COMMUNITY): Payer: Self-pay

## 2024-07-19 ENCOUNTER — Other Ambulatory Visit (HOSPITAL_COMMUNITY): Payer: Self-pay

## 2024-07-19 ENCOUNTER — Other Ambulatory Visit: Payer: Self-pay

## 2024-07-19 MED ORDER — MELOXICAM 15 MG PO TABS
15.0000 mg | ORAL_TABLET | Freq: Every day | ORAL | 0 refills | Status: AC
  Filled 2024-07-19: qty 30, 30d supply, fill #0

## 2024-07-19 MED FILL — Butalbital-Acetaminophen-Caffeine Tab 50-325-40 MG: ORAL | 19 days supply | Qty: 75 | Fill #0 | Status: AC

## 2024-07-19 NOTE — Telephone Encounter (Signed)
 Refill request from [facility] for Fioricet .  Please advise.

## 2024-07-19 NOTE — Telephone Encounter (Signed)
Fioricet refilled

## 2024-07-21 ENCOUNTER — Other Ambulatory Visit (HOSPITAL_COMMUNITY): Payer: Self-pay

## 2024-08-11 ENCOUNTER — Other Ambulatory Visit (HOSPITAL_COMMUNITY): Payer: Self-pay

## 2024-08-11 ENCOUNTER — Other Ambulatory Visit: Payer: Self-pay | Admitting: Internal Medicine

## 2024-08-12 ENCOUNTER — Other Ambulatory Visit: Payer: Self-pay

## 2024-08-12 ENCOUNTER — Other Ambulatory Visit (HOSPITAL_COMMUNITY): Payer: Self-pay

## 2024-08-12 MED ORDER — RIZATRIPTAN BENZOATE 10 MG PO TBDP
10.0000 mg | ORAL_TABLET | ORAL | 11 refills | Status: AC | PRN
  Filled 2024-08-12: qty 10, 30d supply, fill #0
  Filled 2024-09-20: qty 10, 30d supply, fill #1
  Filled 2024-10-21: qty 10, 30d supply, fill #2
  Filled 2024-11-24: qty 10, 30d supply, fill #3

## 2024-08-12 MED ORDER — ONDANSETRON 4 MG PO TBDP
4.0000 mg | ORAL_TABLET | Freq: Three times a day (TID) | ORAL | 3 refills | Status: AC | PRN
  Filled 2024-08-12: qty 9, 3d supply, fill #0
  Filled 2024-09-20: qty 9, 3d supply, fill #1
  Filled 2024-10-21: qty 9, 3d supply, fill #2
  Filled 2024-11-24: qty 9, 3d supply, fill #3

## 2024-08-16 ENCOUNTER — Ambulatory Visit: Payer: Managed Care, Other (non HMO) | Admitting: Internal Medicine

## 2024-08-16 NOTE — Progress Notes (Signed)
 [facility]  Patient ID: Mindy [name], female    DOB: 09-15-1961, 63 y.o.   MRN: [id number]  HPI female never smoker followed for chronic rhinitis/sinusitis, complicated by headache Allergy  profile 12/27/13- Neg, Total IgE 4.5 Had failed allergy  vaccine Eos not elevated 09/12/14 Sinus xray 12/12/2015-no evidence of acute sinusitis, clear -------------------------------------------------------------------08/12/22-  63 year old female never smoker followed for chronic rhinitis/sinusitis,Insomnia,  complicated by   08/14/23- 62 year old female never smoker followed for chronic rhinitis/sinusitis,Insomnia,  complicated by Migraine, superficial thrombophlebitis, Cervical disc disease, GERD,  -Fioricet , sudafed, Ventolin  hfa, Flonase / Astelin  or Dymista ,  Relpax , Norco, Robaxin , -Diclofenac, Pamelor , ED 9/15- Asthmatic Bronchitis > prednisone  was  -----Patient having headaches today - blames insurance change premarin  to . Had flu vax. Occasional ventolin  over past 6 months- no major exacerbation. Flonase  and astelin  usually sufficient for rhinitis.Fioricet  usally works well for chronic intermittent headache.  08/19/24- 63 year old female never smoker followed for chronic rhinitis/sinusitis,Insomnia,  complicated by Migraine, superficial thrombophlebitis, Cervical disc disease, GERD,  -Fioricet , sudafed, Ventolin  hfa, Flonase / Astelin  or Dymista ,  Relpax , Norco, Robaxin , -Diclofenac, Pamelor , Discussed the use of AI scribe software for clinical note transcription with the patient, who gave verbal consent to proceed. Currently reports doing very well. History of Present Illness   Mindy [name] is a 63 year old female who presents for a follow-up visit regarding sinus problems, headaches, and insomnia.  Sinus problems persist, with no specific details provided on current symptoms or management. Headaches are controlled with Fioricet  as needed. Insomnia varies, with no current medication use. She  continues to work to [employer].     Assessment and Plan:    Chronic sinusitis Chronic sinusitis is well-controlled. - Continue current management.  Headache disorder Headache disorder managed with Fioricet . Stress contributes but headaches are controlled. - Continue Fioricet  as needed.  Insomnia Insomnia present, prefers no medication. Sleep quality variable due to life stage. - No medication for insomnia as per her preference.      Review of Systems- see HPI   +  = positive Constitutional:   No-   weight loss, night sweats, fevers, chills, fatigue, lassitude. HEENT:   +  headaches,  No-difficulty swallowing, tooth/dental problems, sore throat,       sneezing, itching, ear ache, +nasal congestion, post nasal drip,  CV:  chest pain, orthopnea, PND, swelling in lower extremities, anasarca, dizziness, palpitations Resp: shortness of breath with exertion or at rest.              No-   productive cough,  non-productive cough,  No-  coughing up of blood.              No-   change in color of mucus.  No- wheezing.   Skin: No-   rash or lesions. GI:  No-   heartburn, indigestion, abdominal pain, nausea, vomiting,  GU:  MS:  No-   joint pain or swelling.   Neuro- nothing unusual Psych:  No- change in mood or affect. No depression or anxiety.  No memory loss.  Objective:   Physical Exam General- Alert, Oriented, Affect-appropriate, Distress- none  Skin- rash-none, lesions- none, excoriation- none Lymphadenopathy- none Head- atraumatic            Eyes- Gross vision intact, PERRLA, conjunctivae clear secretions            Ears- Hearing, canals            Nose-  +turbinate edema, +Septal dev, no-mucus,  No-polyps, erosion, perforation.  Throat- Mallampati III-IV , mucosa clear , drainage- none, tonsils- atrophic Neck- flexible , trachea midline, no stridor , thyroid  nl, carotid no bruit Chest - symmetrical excursion , unlabored           Heart/CV- RRR ,  no murmur , no gallop  , no rub, nl s1 s2                           - JVD- none , edema- none, stasis changes- none, varices- none           Lung-  wheeze -none, cough-none , dullness-none, rub- none           Chest wall-  Abd-  Br/ Gen/ Rectal- Not done, not indicated Extrem- cyanosis- none, clubbing, none, atrophy- none, strength- nl Neuro- grossly intact to observation

## 2024-08-18 ENCOUNTER — Other Ambulatory Visit: Payer: Self-pay

## 2024-08-18 MED FILL — Butalbital-Acetaminophen-Caffeine Tab 50-325-40 MG: ORAL | 19 days supply | Qty: 75 | Fill #1 | Status: AC

## 2024-08-19 ENCOUNTER — Ambulatory Visit: Admitting: Internal Medicine

## 2024-08-19 ENCOUNTER — Encounter: Payer: Self-pay | Admitting: Internal Medicine

## 2024-08-19 VITALS — BP 116/80 | HR 70 | Temp 97.9°F | Ht 64.0 in | Wt 140.4 lb

## 2024-08-19 DIAGNOSIS — G4700 Insomnia, unspecified: Secondary | ICD-10-CM

## 2024-08-19 DIAGNOSIS — J329 Chronic sinusitis, unspecified: Secondary | ICD-10-CM

## 2024-08-19 DIAGNOSIS — R519 Headache, unspecified: Secondary | ICD-10-CM

## 2024-08-19 DIAGNOSIS — J300 Vasomotor rhinitis: Secondary | ICD-10-CM

## 2024-08-19 NOTE — Patient Instructions (Signed)
 Glad you are doing well- good luck !!

## 2024-08-24 ENCOUNTER — Encounter: Payer: Self-pay | Admitting: Internal Medicine

## 2024-08-31 ENCOUNTER — Encounter: Payer: Managed Care, Other (non HMO) | Admitting: Internal Medicine

## 2024-09-20 ENCOUNTER — Other Ambulatory Visit (HOSPITAL_COMMUNITY): Payer: Self-pay

## 2024-09-20 ENCOUNTER — Other Ambulatory Visit: Payer: Self-pay | Admitting: Internal Medicine

## 2024-09-20 ENCOUNTER — Other Ambulatory Visit: Payer: Self-pay

## 2024-09-20 NOTE — Telephone Encounter (Signed)
 Refill request for Fioricet .  Patient last OV 08/19/2024.  Dr. Neysa, please advise.  Thank you.  Allergies  Allergen Reactions   Oxaprozin Hives and Itching    daypro

## 2024-09-21 ENCOUNTER — Other Ambulatory Visit (HOSPITAL_COMMUNITY): Payer: Self-pay

## 2024-09-21 ENCOUNTER — Other Ambulatory Visit: Payer: Self-pay

## 2024-09-21 MED ORDER — BUTALBITAL-APAP-CAFFEINE 50-325-40 MG PO TABS
1.0000 | ORAL_TABLET | Freq: Four times a day (QID) | ORAL | 5 refills | Status: AC | PRN
  Filled 2024-09-21: qty 75, 19d supply, fill #0
  Filled 2024-11-24: qty 75, 19d supply, fill #1

## 2024-09-21 NOTE — Telephone Encounter (Signed)
Fioricet refilled

## 2024-09-27 ENCOUNTER — Other Ambulatory Visit (HOSPITAL_COMMUNITY): Payer: Self-pay

## 2024-10-11 ENCOUNTER — Other Ambulatory Visit: Payer: Self-pay

## 2024-10-11 ENCOUNTER — Other Ambulatory Visit (HOSPITAL_COMMUNITY): Payer: Self-pay

## 2024-10-11 MED ORDER — ESTROGENS CONJUGATED 0.9 MG PO TABS
0.9000 mg | ORAL_TABLET | Freq: Every day | ORAL | 0 refills | Status: AC
  Filled 2024-10-11: qty 90, 90d supply, fill #0

## 2024-10-12 ENCOUNTER — Other Ambulatory Visit: Payer: Self-pay

## 2024-10-20 ENCOUNTER — Telehealth: Admitting: Nurse Practitioner

## 2024-10-20 DIAGNOSIS — J40 Bronchitis, not specified as acute or chronic: Secondary | ICD-10-CM

## 2024-10-20 MED ORDER — DOXYCYCLINE HYCLATE 100 MG PO TABS: 100.0000 mg | ORAL_TABLET | Freq: Two times a day (BID) | ORAL | 0 refills | Status: AC

## 2024-10-20 MED ORDER — BENZONATATE 100 MG PO CAPS: 100.0000 mg | ORAL_CAPSULE | Freq: Three times a day (TID) | ORAL | 0 refills | Status: AC | PRN

## 2024-10-20 MED ORDER — PREDNISONE 20 MG PO TABS: 20.0000 mg | ORAL_TABLET | Freq: Every day | ORAL | 0 refills | Status: AC

## 2024-10-20 NOTE — Progress Notes (Signed)
 We are sorry that you are not feeling well.  Here is how we plan to help!  Based on your presentation I believe you most likely have A cough due to bacteria.  When patients have a fever and a productive cough with a change in color or increased sputum production, we are concerned about bacterial bronchitis.  If left untreated it can progress to pneumonia.  If your symptoms do not improve with your treatment plan it is important that you contact your provider.   I have prescribed Doxycycline  100 mg twice a day for 7 days     In addition you may use A prescription cough medication called Tessalon  Perles 100mg . You may take 1-2 capsules every 8 hours as needed for your cough.  We will also prescribe:  Prednisone  20 mg daily for 5 days   From your responses in the eVisit questionnaire you describe inflammation in the upper respiratory tract which is causing a significant cough.  This is commonly called Bronchitis and has four common causes:   Allergies Viral Infections Acid Reflux Bacterial Infection Allergies, viruses and acid reflux are treated by controlling symptoms or eliminating the cause. An example might be a cough caused by taking certain blood pressure medications. You stop the cough by changing the medication. Another example might be a cough caused by acid reflux. Controlling the reflux helps control the cough.  USE OF BRONCHODILATOR (RESCUE) INHALERS: There is a risk from using your bronchodilator too frequently.  The risk is that over-reliance on a medication which only relaxes the muscles surrounding the breathing tubes can reduce the effectiveness of medications prescribed to reduce swelling and congestion of the tubes themselves.  Although you feel brief relief from the bronchodilator inhaler, your asthma may actually be worsening with the tubes becoming more swollen and filled with mucus.  This can delay other crucial treatments, such as oral steroid medications. If you need to use a  bronchodilator inhaler daily, several times per day, you should discuss this with your provider.  There are probably better treatments that could be used to keep your asthma under control.     HOME CARE Only take medications as instructed by your medical team. Complete the entire course of an antibiotic. Drink plenty of fluids and get plenty of rest. Avoid close contacts especially the very young and the elderly Cover your mouth if you cough or cough into your sleeve. Always remember to wash your hands A steam or ultrasonic humidifier can help congestion.   GET HELP RIGHT AWAY IF: You develop worsening fever. You become short of breath You cough up blood. Your symptoms persist after you have completed your treatment plan MAKE SURE YOU  Understand these instructions. Will watch your condition. Will get help right away if you are not doing well or get worse.  Your e-visit answers were reviewed by a board certified advanced clinical practitioner to complete your personal care plan.  Depending on the condition, your plan could have included both over the counter or prescription medications. If there is a problem please reply  once you have received a response from your provider. Your safety is important to us .  If you have drug allergies check your prescription carefully.    You can use MyChart to ask questions about todays visit, request a non-urgent call back, or ask for a work or school excuse for 24 hours related to this e-Visit. If it has been greater than 24 hours you will need to follow up  with your provider, or enter a new e-Visit to address those concerns. You will get an e-mail in the next two days asking about your experience.  I hope that your e-visit has been valuable and will speed your recovery. Thank you for using e-visits.   I have spent 5 minutes in review of e-visit questionnaire, review and updating patient chart, medical decision making and response to patient.    Lauraine Kitty, FNP

## 2024-10-22 ENCOUNTER — Other Ambulatory Visit (HOSPITAL_COMMUNITY): Payer: Self-pay

## 2024-10-22 ENCOUNTER — Encounter: Payer: Self-pay | Admitting: Internal Medicine

## 2024-10-24 MED ORDER — SCOPOLAMINE 1 MG/3DAYS TD PT72: 1.0000 | MEDICATED_PATCH | TRANSDERMAL | 0 refills | Status: AC

## 2024-11-01 ENCOUNTER — Other Ambulatory Visit (HOSPITAL_BASED_OUTPATIENT_CLINIC_OR_DEPARTMENT_OTHER): Payer: Self-pay | Admitting: Internal Medicine

## 2024-11-01 DIAGNOSIS — Z1231 Encounter for screening mammogram for malignant neoplasm of breast: Secondary | ICD-10-CM

## 2024-11-24 ENCOUNTER — Other Ambulatory Visit: Payer: Self-pay

## 2024-11-26 ENCOUNTER — Encounter (HOSPITAL_BASED_OUTPATIENT_CLINIC_OR_DEPARTMENT_OTHER): Admitting: Radiology

## 2024-11-26 DIAGNOSIS — Z1231 Encounter for screening mammogram for malignant neoplasm of breast: Secondary | ICD-10-CM

## 2024-12-06 ENCOUNTER — Encounter: Admitting: Internal Medicine
